# Patient Record
Sex: Male | Born: 1955 | Race: Black or African American | Hispanic: No | Marital: Single | Smoking: Never smoker
Health system: Southern US, Community
[De-identification: ages and names within clinical notes are randomized; demographics above are authoritative.]

## PROBLEM LIST (undated history)

## (undated) DIAGNOSIS — K7460 Unspecified cirrhosis of liver: Secondary | ICD-10-CM

## (undated) DIAGNOSIS — Z8719 Personal history of other diseases of the digestive system: Secondary | ICD-10-CM

## (undated) DIAGNOSIS — K759 Inflammatory liver disease, unspecified: Secondary | ICD-10-CM

## (undated) HISTORY — PX: COLONOSCOPY: SHX174

## (undated) HISTORY — PX: FRACTURE SURGERY: SHX138

---
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
## 1998-01-29 ENCOUNTER — Inpatient Hospital Stay (HOSPITAL_COMMUNITY): Admission: EM | Admit: 1998-01-29 | Discharge: 1998-01-30 | Payer: Self-pay | Admitting: Emergency Medicine

## 1998-01-29 ENCOUNTER — Encounter: Payer: Self-pay | Admitting: Orthopedic Surgery

## 1999-09-20 ENCOUNTER — Emergency Department (HOSPITAL_COMMUNITY): Admission: EM | Admit: 1999-09-20 | Discharge: 1999-09-20 | Payer: Self-pay | Admitting: Emergency Medicine

## 2000-12-15 ENCOUNTER — Emergency Department (HOSPITAL_COMMUNITY): Admission: EM | Admit: 2000-12-15 | Discharge: 2000-12-15 | Payer: Self-pay | Admitting: Emergency Medicine

## 2001-06-17 ENCOUNTER — Emergency Department (HOSPITAL_COMMUNITY): Admission: EM | Admit: 2001-06-17 | Discharge: 2001-06-17 | Payer: Self-pay

## 2003-02-04 ENCOUNTER — Emergency Department (HOSPITAL_COMMUNITY): Admission: EM | Admit: 2003-02-04 | Discharge: 2003-02-04 | Payer: Self-pay | Admitting: Emergency Medicine

## 2003-02-04 ENCOUNTER — Encounter: Payer: Self-pay | Admitting: Emergency Medicine

## 2009-09-20 ENCOUNTER — Emergency Department (HOSPITAL_COMMUNITY): Admission: EM | Admit: 2009-09-20 | Discharge: 2009-09-20 | Payer: Self-pay | Admitting: Emergency Medicine

## 2009-09-25 ENCOUNTER — Emergency Department (HOSPITAL_COMMUNITY): Admission: EM | Admit: 2009-09-25 | Discharge: 2009-09-25 | Payer: Self-pay | Admitting: Emergency Medicine

## 2012-05-26 ENCOUNTER — Ambulatory Visit: Payer: Self-pay | Admitting: Family Medicine

## 2012-06-23 ENCOUNTER — Ambulatory Visit: Payer: Self-pay | Admitting: Family Medicine

## 2012-07-07 ENCOUNTER — Ambulatory Visit: Payer: Self-pay | Admitting: Family Medicine

## 2012-12-24 ENCOUNTER — Ambulatory Visit: Payer: Self-pay

## 2013-05-24 ENCOUNTER — Encounter (HOSPITAL_COMMUNITY): Payer: Self-pay | Admitting: Emergency Medicine

## 2013-05-24 ENCOUNTER — Emergency Department (HOSPITAL_COMMUNITY)
Admission: EM | Admit: 2013-05-24 | Discharge: 2013-05-24 | Disposition: A | Discharge status: Home / Self Care | Payer: Self-pay | Attending: Emergency Medicine | Admitting: Emergency Medicine

## 2013-05-24 ENCOUNTER — Emergency Department (HOSPITAL_COMMUNITY): Payer: Self-pay

## 2013-05-24 DIAGNOSIS — R0789 Other chest pain: Secondary | ICD-10-CM

## 2013-05-24 DIAGNOSIS — R071 Chest pain on breathing: Secondary | ICD-10-CM | POA: Insufficient documentation

## 2013-05-24 DIAGNOSIS — R0602 Shortness of breath: Secondary | ICD-10-CM | POA: Insufficient documentation

## 2013-05-24 LAB — CBC
HCT: 48.7 % (ref 39.0–52.0)
Hemoglobin: 16.7 g/dL (ref 13.0–17.0)
MCH: 28.9 pg (ref 26.0–34.0)
MCHC: 34.3 g/dL (ref 30.0–36.0)
MCV: 84.3 fL (ref 78.0–100.0)
Platelets: 160 10*3/uL (ref 150–400)
RBC: 5.78 MIL/uL (ref 4.22–5.81)
RDW: 13.3 % (ref 11.5–15.5)
WBC: 10.1 10*3/uL (ref 4.0–10.5)

## 2013-05-24 LAB — COMPREHENSIVE METABOLIC PANEL
ALT: 52 U/L (ref 0–53)
BUN: 12 mg/dL (ref 6–23)
CO2: 25 mEq/L (ref 19–32)
Calcium: 9.5 mg/dL (ref 8.4–10.5)
Creatinine, Ser: 1.01 mg/dL (ref 0.50–1.35)
GFR calc Af Amer: >90 mL/min (ref >90)
GFR calc non Af Amer: 79 mL/min — ABNORMAL LOW (ref >90)
Glucose, Bld: 102 mg/dL — ABNORMAL HIGH (ref 70–99)
Total Protein: 9.3 g/dL — ABNORMAL HIGH (ref 6.0–8.3)

## 2013-05-24 LAB — POCT I-STAT TROPONIN I: Troponin i, poc: 0.12 ng/mL (ref 0.00–0.08)

## 2013-05-24 LAB — D-DIMER, QUANTITATIVE: D-Dimer, Quant: 3.05 ug/mL-FEU — ABNORMAL HIGH (ref 0.00–0.48)

## 2013-05-24 LAB — TROPONIN I: Troponin I: <0.3 ng/mL (ref <0.30)

## 2013-05-24 MED ORDER — ASPIRIN 81 MG PO CHEW
324.0000 mg | CHEWABLE_TABLET | Freq: Once | ORAL | Status: AC
  Administered 2013-05-24: 324 mg via ORAL
  Filled 2013-05-24: qty 4

## 2013-05-24 MED ORDER — KETOROLAC TROMETHAMINE 30 MG/ML IJ SOLN
30.0000 mg | Freq: Once | INTRAMUSCULAR | Status: AC
  Administered 2013-05-24: 30 mg via INTRAVENOUS
  Filled 2013-05-24: qty 1

## 2013-05-24 MED ORDER — HYDROCODONE-ACETAMINOPHEN 5-325 MG PO TABS: 1.0000 | ORAL_TABLET | ORAL | Status: DC | PRN

## 2013-05-24 MED ORDER — SODIUM CHLORIDE 0.9 % IV BOLUS (SEPSIS)
1000.0000 mL | Freq: Once | INTRAVENOUS | Status: AC
  Administered 2013-05-24: 1000 mL via INTRAVENOUS

## 2013-05-24 MED ORDER — IOHEXOL 350 MG/ML SOLN
100.0000 mL | Freq: Once | INTRAVENOUS | Status: AC | PRN
  Administered 2013-05-24: 75 mL via INTRAVENOUS

## 2013-05-24 MED ORDER — IBUPROFEN 800 MG PO TABS: 800.0000 mg | ORAL_TABLET | Freq: Three times a day (TID) | ORAL | Status: DC

## 2013-05-24 NOTE — ED Notes (Signed)
Rt upper chest pain and under ribs x 4 days has had some sob no injury no cough hurts to cough badly

## 2013-05-24 NOTE — ED Provider Notes (Signed)
TIME SEEN: 9:09 AM  CHIEF COMPLAINT: Right-sided chest pain  HPI: Patient is a 57 year old male with no significant past medical history who presents the emergency department with one week of right-sided chest pain. He still describes the pain as a stabbing pain that radiates into his upper abdomen. He feels short of breath because it hurts more to take a deep breath. He also reports it hurts to bend over or lying on his right side. His pain improves at home with ibuprofen. He denies any nausea, vomiting or diarrhea. No fever. No productive cough. No lower extremity swelling or pain. No h/o injury to his right chest wall.  Patient denies a history of cardiac disease, hypertension, diabetes, hyperlipidemia, tobacco use, family history of premature CAD. He denies a history of prior PE or DVT, recent prolonged immobilization such as hospitalization or long flight, recent surgery, trauma, fracture.  PCP - none  ROS: See HPI Constitutional: no fever  Eyes: no drainage  ENT: no runny nose   Cardiovascular:   chest pain  Resp: SOB  GI: no vomiting GU: no dysuria Integumentary: no rash  Allergy: no hives  Musculoskeletal: no leg swelling  Neurological: no slurred speech ROS otherwise negative  PAST MEDICAL HISTORY/PAST SURGICAL HISTORY:  History reviewed. No pertinent past medical history.  MEDICATIONS:  Prior to Admission medications   Not on File    ALLERGIES:  No Known Allergies  SOCIAL HISTORY:  History  Substance Use Topics  . Smoking status: Never Smoker   . Smokeless tobacco: Not on file  . Alcohol Use: Yes    FAMILY HISTORY: No family history on file.  EXAM: BP 142/98  Pulse 110  Temp(Src) 97.5 F (36.4 C)  Resp 20  SpO2 99% CONSTITUTIONAL: Alert and oriented and responds appropriately to questions. Well-appearing; well-nourished HEAD: Normocephalic EYES: Conjunctivae clear, PERRL ENT: normal nose; no rhinorrhea; moist mucous membranes; pharynx without lesions  noted NECK: Supple, no meningismus, no LAD  CARD: RRR; S1 and S2 appreciated; no murmurs, no clicks, no rubs, no gallops RESP: Normal chest excursion without splinting or tachypnea; breath sounds clear and equal bilaterally; no wheezes, no rhonchi, no rales, chest wall is tender to palpation over the right side without crepitus ABD/GI: Normal bowel sounds; non-distended; soft, non-tender, no rebound, no guarding BACK:  The back appears normal and is non-tender to palpation, there is no CVA tenderness EXT: Normal ROM in all joints; non-tender to palpation; no edema; normal capillary refill; no cyanosis    SKIN: Normal color for age and race; warm NEURO: Moves all extremities equally PSYCH: The patient's mood and manner are appropriate. Grooming and personal hygiene are appropriate.  MEDICAL DECISION MAKING: Patient here with atypical chest pain that is likely chest wall pain. He has no risk factors for PE and ACS other than age.  Given pain has been present for one week, we will obtain one set of cardiac enzymes. We'll also obtain d-dimer given his pleuritic component and tachycardia in the ED. We'll also obtain chest x-ray. Will give Toradol for pain relief.  ED PROGRESS: Patient's initial troponin on I stat is 0.12. Will repeat.  10:12 AM  Patient's d-dimer is elevated at 3.05. We'll obtain CT imaging of chest to rule out pulmonary embolus.   Patient's tachycardia and tachypnea have improved.  11:39 AM  Pt reports feeling much better after Toradol. His pain is almost completely gone. His CT chest shows no pulmonary embolus or other abnormality. Repeat troponin was negative. Suspect initial slight  elevation was secondary to hemolysis. Will repeat troponin at 6 hours after the onset of symptoms. He reports his symptoms started at 7 AM. Patient is comfortable with this plan.  2:55 PM  Pt is still feeling well. His second troponin is negative. We'll discharge home with prescription for ibuprofen and  Vicodin. Will give PCP followup information. Given strict return precautions. Patient and wife at bedside verbalize understanding and are comfortable with plan.     EKG Interpretation    Date/Time:  Monday May 24 2013 09:05:30 EST Ventricular Rate:  99 PR Interval:  158 QRS Duration: 86 QT Interval:  354 QTC Calculation: 454 R Axis:   -64 Text Interpretation:  Normal sinus rhythm Left axis deviation Abnormal ECG Confirmed by WARD  DO, KRISTEN ([id number]) on 05/24/2013 9:09:00 AM             Layla Maw Ward, DO 05/24/13 1456

## 2013-12-07 ENCOUNTER — Encounter (HOSPITAL_COMMUNITY): Payer: Self-pay | Admitting: Emergency Medicine

## 2013-12-07 ENCOUNTER — Emergency Department (HOSPITAL_COMMUNITY)
Admission: EM | Admit: 2013-12-07 | Discharge: 2013-12-07 | Disposition: A | Discharge status: Home / Self Care | Payer: Self-pay | Attending: Emergency Medicine | Admitting: Emergency Medicine

## 2013-12-07 DIAGNOSIS — B356 Tinea cruris: Secondary | ICD-10-CM | POA: Insufficient documentation

## 2013-12-07 DIAGNOSIS — Z79899 Other long term (current) drug therapy: Secondary | ICD-10-CM | POA: Insufficient documentation

## 2013-12-07 MED ORDER — GRISEOFULVIN ULTRAMICROSIZE 250 MG PO TABS: 250.0000 mg | ORAL_TABLET | Freq: Two times a day (BID) | ORAL | Status: DC

## 2013-12-07 MED ORDER — DIPHENHYDRAMINE HCL 25 MG PO TABS: 25.0000 mg | ORAL_TABLET | Freq: Four times a day (QID) | ORAL | Status: DC | PRN

## 2013-12-07 NOTE — ED Provider Notes (Signed)
Medical screening examination/treatment/procedure(s) were performed by non-physician practitioner and as supervising physician I was immediately available for consultation/collaboration.   EKG Interpretation None       Threasa Beards, MD 12/07/13 1225

## 2013-12-07 NOTE — Discharge Summary (Signed)
[facility]ia[facility] was not able to see patient, [facility] orange card information and primary care resource guide will be mailed to the address listed

## 2013-12-07 NOTE — ED Provider Notes (Signed)
CSN: [id number]     Arrival date & time 12/07/13  1016 History  This chart was scribed for non-physician practitioner, Clayton Bibles, PA-C, working with Threasa Beards, MD by Roe Coombs, ED Scribe. This patient was seen in room TR07C/TR07C and the patient's care was started at 12:12 PM.   Chief Complaint  Patient presents with  . Pruritis    The history is provided by the patient. No language interpreter was used.    HPI Comments: Luis Morrison is a 58 y.o. male who presents to the Emergency Department complaining of a burning, pruritic rash for more than 1 week. Patient thinks that rash is jock itch and he has been using Lamisil and topical camphor for the past 1 week without only mild improvement. He denies testicular swelling, testicular pain, penile discharge, penile pain, abdominal pain, fever, vomiting, or nausea. He does not take steroids regularly. He has no chronic medical conditions. Patient does not have a PCP.   History reviewed. No pertinent past medical history. History reviewed. No pertinent past surgical history. History reviewed. No pertinent family history. History  Substance Use Topics  . Smoking status: Never Smoker   . Smokeless tobacco: Not on file  . Alcohol Use: Yes    Review of Systems  Constitutional: Negative for fever and chills.  Gastrointestinal: Negative for nausea and vomiting.  Genitourinary: Negative for discharge, penile swelling, penile pain and testicular pain.  Skin: Positive for rash.  All other systems reviewed and are negative.    Allergies  Review of patient's allergies indicates no known allergies.  Home Medications   Prior to Admission medications   Medication Sig Start Date End Date Taking? Authorizing Provider  Emollient (GOLD BOND ULTIMATE HEALING) CREA Apply 1 application topically 3 (three) times daily as needed (for itching).   Yes Historical Provider, MD  menthol-zinc oxide (GOLD BOND) powder Apply 1 application topically 3  (three) times daily as needed (for itching).   Yes Historical Provider, MD   Triage Vitals: BP 132/82  Pulse 75  Temp(Src) 97.9 F (36.6 C) (Oral)  Resp 20  Ht 5\' 10"  (1.778 m)  Wt 200 lb (90.719 kg)  BMI 28.70 kg/m2  SpO2 95%  Physical Exam  Nursing note and vitals reviewed. Constitutional: He appears well-developed and well-nourished. No distress.  HENT:  Head: Normocephalic and atraumatic.  Neck: Neck supple.  Pulmonary/Chest: Effort normal.  Neurological: He is alert.  Skin: He is not diaphoretic.  Raised scaly whitish plaque to bilateral groin, centrifugal spread with central clearing. No break in the skin. No erythema, edema, warmth or discharge.    ED Course  Procedures (including critical care time) DIAGNOSTIC STUDIES: Oxygen Saturation is 95% on room air, adequate by my interpretation.    COORDINATION OF CARE: 12:15 PM- Patient informed of current plan for treatment and evaluation and agrees with plan at this time.    MDM   Final diagnoses:  Tinea cruris   Afebrile nontoxic immunocompetent patient with tinea cruris not improving with topical medication.  D/C home with griseofulvin per Up To Date. PCP follow up  Discussed  findings, treatment, and follow up  with patient.  Pt given return precautions.  Pt verbalizes understanding and agrees with plan.      I personally performed the services described in this documentation, which was scribed in my presence. The recorded information has been reviewed and is accurate.    Clayton Bibles, PA-C 12/07/13 1224

## 2013-12-07 NOTE — Discharge Instructions (Signed)
Read the information below.  Use the prescribed medication as directed.  Please discuss all new medications with your pharmacist.  You may return to the Emergency Department at any time for worsening condition or any new symptoms that concern you.  If you develop redness, swelling, pus draining from the wound, or fevers greater than 100.4, return to the ER immediately for a recheck.    Jock Itch Jock itch is a fungal infection of the skin in the groin area. It is sometimes called "ringworm" even though it is not caused by a worm. A fungus is a type of germ that thrives in dark, damp places.  CAUSES  This infection may spread from:  A fungus infection elsewhere on the body (such as athlete's foot).  Sharing towels or clothing. This infection is more common in:  Hot, humid climates.  People who wear tight-fitting clothing or wet bathing suits for long periods of time.  Athletes.  Overweight people.  People with diabetes. SYMPTOMS  Jock itch causes the following symptoms:  Red, pink or brown rash in the groin. Rash may spread to the thighs, anus, and buttocks.  Itching. DIAGNOSIS  Your caregiver may make the diagnosis by looking at the rash. Sometimes a skin scraping will be sent to test for fungus. Testing can be done either by looking under the microscope or by doing a culture (test to try to grow the fungus). A culture can take up to 2 weeks to come back. TREATMENT  Jock itch may be treated with:  Skin cream or ointment to kill fungus.  Medicine by mouth to kill fungus.  Skin cream or ointment to calm the itching.  Compresses or medicated powders to dry the infected skin. HOME CARE INSTRUCTIONS   Be sure to treat the rash completely. Follow your caregiver's instructions. It can take a couple of weeks to treat. If you do not treat the infection long enough, the rash can come back.  Wear loose-fitting clothing.  Men should wear cotton boxer shorts.  Women should wear  cotton underwear.  Avoid hot baths.  Dry the groin area well after bathing. SEEK MEDICAL CARE IF:   Your rash is worse.  Your rash is spreading.  Your rash returns after treatment is finished.  Your rash is not gone in 4 weeks. Fungal infections are slow to respond to treatment. Some redness may remain for several weeks after the fungus is gone. SEEK IMMEDIATE MEDICAL CARE IF:  The area becomes red, warm, tender, and swollen.  You have a fever. Document Released: 05/17/2002 Document Revised: 08/19/2011 Document Reviewed: 04/15/2008 [facility] Patient Information 2015 [place], [place]. This information is not intended to replace advice given to you by your health care provider. Make sure you discuss any questions you have with your health care provider.

## 2013-12-07 NOTE — ED Notes (Signed)
He states 'IM having the jock itch, i tried stuff from the drug store but its not getting better."

## 2014-08-30 ENCOUNTER — Emergency Department (HOSPITAL_COMMUNITY): Payer: Self-pay

## 2014-08-30 ENCOUNTER — Emergency Department (HOSPITAL_COMMUNITY)
Admission: EM | Admit: 2014-08-30 | Discharge: 2014-08-30 | Disposition: A | Discharge status: Home / Self Care | Payer: Self-pay | Attending: Emergency Medicine | Admitting: Emergency Medicine

## 2014-08-30 ENCOUNTER — Encounter (HOSPITAL_COMMUNITY): Payer: Self-pay

## 2014-08-30 DIAGNOSIS — R1031 Right lower quadrant pain: Secondary | ICD-10-CM | POA: Insufficient documentation

## 2014-08-30 DIAGNOSIS — R110 Nausea: Secondary | ICD-10-CM | POA: Insufficient documentation

## 2014-08-30 DIAGNOSIS — R109 Unspecified abdominal pain: Secondary | ICD-10-CM

## 2014-08-30 DIAGNOSIS — R1011 Right upper quadrant pain: Secondary | ICD-10-CM | POA: Insufficient documentation

## 2014-08-30 DIAGNOSIS — D696 Thrombocytopenia, unspecified: Secondary | ICD-10-CM | POA: Insufficient documentation

## 2014-08-30 LAB — COMPREHENSIVE METABOLIC PANEL
ALK PHOS: 77 U/L (ref 39–117)
ALT: 61 U/L — ABNORMAL HIGH (ref 0–53)
AST: 72 U/L — ABNORMAL HIGH (ref 0–37)
Albumin: 3.2 g/dL — ABNORMAL LOW (ref 3.5–5.2)
Anion gap: 8 (ref 5–15)
BUN: 9 mg/dL (ref 6–23)
CO2: 26 mmol/L (ref 19–32)
Calcium: 8.7 mg/dL (ref 8.4–10.5)
Chloride: 103 mmol/L (ref 96–112)
Creatinine, Ser: 1.03 mg/dL (ref 0.50–1.35)
GFR calc non Af Amer: 77 mL/min — ABNORMAL LOW (ref >90)
GFR, EST AFRICAN AMERICAN: 89 mL/min — AB (ref >90)
GLUCOSE: 126 mg/dL — AB (ref 70–99)
POTASSIUM: 4.2 mmol/L (ref 3.5–5.1)
Sodium: 137 mmol/L (ref 135–145)
Total Bilirubin: 1.2 mg/dL (ref 0.3–1.2)
Total Protein: 8.3 g/dL (ref 6.0–8.3)

## 2014-08-30 LAB — URINALYSIS, ROUTINE W REFLEX MICROSCOPIC
Glucose, UA: NEGATIVE mg/dL
Hgb urine dipstick: NEGATIVE
KETONES UR: 15 mg/dL — AB
LEUKOCYTES UA: NEGATIVE
NITRITE: NEGATIVE
PROTEIN: NEGATIVE mg/dL
Specific Gravity, Urine: 1.027 (ref 1.005–1.030)
Urobilinogen, UA: 2 mg/dL — ABNORMAL HIGH (ref 0.0–1.0)
pH: 6 (ref 5.0–8.0)

## 2014-08-30 LAB — CBC WITH DIFFERENTIAL/PLATELET
Basophils Absolute: 0 10*3/uL (ref 0.0–0.1)
Basophils Relative: 0 % (ref 0–1)
EOS ABS: 0 10*3/uL (ref 0.0–0.7)
EOS PCT: 0 % (ref 0–5)
HEMATOCRIT: 46.4 % (ref 39.0–52.0)
Hemoglobin: 16 g/dL (ref 13.0–17.0)
LYMPHS ABS: 1 10*3/uL (ref 0.7–4.0)
Lymphocytes Relative: 14 % (ref 12–46)
MCH: 29.5 pg (ref 26.0–34.0)
MCHC: 34.5 g/dL (ref 30.0–36.0)
MCV: 85.6 fL (ref 78.0–100.0)
Monocytes Absolute: 0.9 10*3/uL (ref 0.1–1.0)
Monocytes Relative: 12 % (ref 3–12)
NEUTROS ABS: 5.2 10*3/uL (ref 1.7–7.7)
Neutrophils Relative %: 73 % (ref 43–77)
PLATELETS: 83 10*3/uL — AB (ref 150–400)
RBC: 5.42 MIL/uL (ref 4.22–5.81)
RDW: 13.6 % (ref 11.5–15.5)
WBC: 7.1 10*3/uL (ref 4.0–10.5)

## 2014-08-30 LAB — LIPASE, BLOOD: Lipase: 22 U/L (ref 11–59)

## 2014-08-30 LAB — I-STAT TROPONIN, ED: TROPONIN I, POC: 0.01 ng/mL (ref 0.00–0.08)

## 2014-08-30 MED ORDER — HYDROMORPHONE HCL 1 MG/ML IJ SOLN
1.0000 mg | INTRAMUSCULAR | Status: DC | PRN
  Administered 2014-08-30 (×2): 1 mg via INTRAVENOUS
  Filled 2014-08-30 (×2): qty 1

## 2014-08-30 MED ORDER — ALBUTEROL SULFATE HFA 108 (90 BASE) MCG/ACT IN AERS
2.0000 | INHALATION_SPRAY | RESPIRATORY_TRACT | Status: DC
  Administered 2014-08-30 (×2): 2 via RESPIRATORY_TRACT
  Filled 2014-08-30: qty 6.7

## 2014-08-30 MED ORDER — IOHEXOL 300 MG/ML  SOLN
100.0000 mL | Freq: Once | INTRAMUSCULAR | Status: AC | PRN
  Administered 2014-08-30: 100 mL via INTRAVENOUS

## 2014-08-30 MED ORDER — SODIUM CHLORIDE 0.9 % IV SOLN
1000.0000 mL | Freq: Once | INTRAVENOUS | Status: AC
  Administered 2014-08-30: 1000 mL via INTRAVENOUS

## 2014-08-30 MED ORDER — HYDROMORPHONE HCL 1 MG/ML IJ SOLN: 1.0000 mg | Freq: Once | INTRAMUSCULAR | Status: DC

## 2014-08-30 MED ORDER — ONDANSETRON HCL 4 MG/2ML IJ SOLN
4.0000 mg | Freq: Once | INTRAMUSCULAR | Status: AC
  Administered 2014-08-30: 4 mg via INTRAVENOUS
  Filled 2014-08-30: qty 2

## 2014-08-30 MED ORDER — SODIUM CHLORIDE 0.9 % IV SOLN
1000.0000 mL | INTRAVENOUS | Status: DC
  Administered 2014-08-30: 1000 mL via INTRAVENOUS

## 2014-08-30 NOTE — Discharge Instructions (Signed)
Abdominal Pain Many things can cause abdominal pain. Usually, abdominal pain is not caused by a disease and will improve without treatment. It can often be observed and treated at home. Your health care provider will do a physical exam and possibly order blood tests and X-rays to help determine the seriousness of your pain. However, in many cases, more time must pass before a clear cause of the pain can be found. Before that point, your health care provider may not know if you need more testing or further treatment. HOME CARE INSTRUCTIONS  Monitor your abdominal pain for any changes. The following actions may help to alleviate any discomfort you are experiencing:  Only take over-the-counter or prescription medicines as directed by your health care provider.  Do not take laxatives unless directed to do so by your health care provider.  Try a clear liquid diet (broth, tea, or water) as directed by your health care provider. Slowly move to a bland diet as tolerated. SEEK MEDICAL CARE IF:  You have unexplained abdominal pain.  You have abdominal pain associated with nausea or diarrhea.  You have pain when you urinate or have a bowel movement.  You experience abdominal pain that wakes you in the night.  You have abdominal pain that is worsened or improved by eating food.  You have abdominal pain that is worsened with eating fatty foods.  You have a fever. SEEK IMMEDIATE MEDICAL CARE IF:   Your pain does not go away within 2 hours.  You keep throwing up (vomiting).  Your pain is felt only in portions of the abdomen, such as the right side or the left lower portion of the abdomen.  You pass bloody or black tarry stools. MAKE SURE YOU:  Understand these instructions.   Will watch your condition.   Will get help right away if you are not doing well or get worse.  Document Released: 03/06/2005 Document Revised: 06/01/2013 Document Reviewed:  02/03/2013 Thrombocytopenia Thrombocytopenia is a condition in which there is an abnormally small number of platelets in your blood. Platelets are also called thrombocytes. Platelets are needed for blood clotting. CAUSES Thrombocytopenia is caused by:   Decreased production of platelets. This can be caused by:  Aplastic anemia in which your bone marrow quits making blood cells.  Cancer in the bone marrow.  Use of certain medicines, including chemotherapy.  Infection in the bone marrow.  Heavy alcohol consumption.  Increased destruction of platelets. This can be caused by:  Certain immune diseases.  Use of certain drugs.  Certain blood clotting disorders.  Certain inherited disorders.  Certain bleeding disorders.  Pregnancy.  Having an enlarged spleen (hypersplenism). In hypersplenism, the spleen gathers up platelets from circulation. This means the platelets are not available to help with blood clotting. The spleen can enlarge due to cirrhosis or other conditions. SYMPTOMS  The symptoms of thrombocytopenia are side effects of poor blood clotting. Some of these are:  Abnormal bleeding.  Nosebleeds.  Heavy menstrual periods.  Blood in the urine or stools.  Purpura. This is a purplish discoloration in the skin produced by small bleeding vessels near the surface of the skin.  Bruising.  A rash that may be petechial. This looks like pinpoint, purplish-red spots on the skin and mucous membranes. It is caused by bleeding from small blood vessels (capillaries). DIAGNOSIS  Your caregiver will make this diagnosis based on your exam and blood tests. Sometimes, a bone marrow study is done to look for the original cells (megakaryocytes)  that make platelets. TREATMENT  Treatment depends on the cause of the condition.  Medicines may be given to help protect your platelets from being destroyed.  In some cases, a replacement (transfusion) of platelets may be required to stop or  prevent bleeding.  Sometimes, the spleen must be surgically removed. HOME CARE INSTRUCTIONS   Check the skin and linings inside your mouth for bruising or bleeding as directed by your caregiver.  Check your sputum, urine, and stool for blood as directed by your caregiver.  Do not return to any activities that could cause bumps or bruises until your caregiver says it is okay.  Take extra care not to cut yourself when shaving or when using scissors, needles, knives, and other tools.  Take extra care not to burn yourself when ironing or cooking.  Ask your caregiver if it is okay for you to drink alcohol.  Only take over-the-counter or prescription medicines as directed by your caregiver.  Notify all your caregivers, including dentists and eye doctors, about your condition. SEEK IMMEDIATE MEDICAL CARE IF:   You develop active bleeding from anywhere in your body.  You develop unexplained bruising or bleeding.  You have blood in your sputum, urine, or stool. MAKE SURE YOU:  Understand these instructions.  Will watch your condition.  Will get help right away if you are not doing well or get worse. Document Released: 05/27/2005 Document Revised: 08/19/2011 Document Reviewed: 03/29/2011 [facility] Patient Information 2015 [place], [place]. This information is not intended to replace advice given to you by your health care provider. Make sure you discuss any questions you have with your health care provider.  [employer] Patient Information 2015 [place]. This information is not intended to replace advice given to you by your health care provider. Make sure you discuss any questions you have with your health care provider.

## 2014-08-30 NOTE — ED Notes (Signed)
Not able to get blood. Pt had to go to bathroom

## 2014-08-30 NOTE — ED Notes (Signed)
Phlebotomy notified, unable to obtain blood form IV start.

## 2014-08-30 NOTE — ED Notes (Signed)
Patient in [place] at this time

## 2014-08-30 NOTE — ED Notes (Signed)
PT returned from [place]. Pt monitored by pulse ox, bp cuff, and 5-lead.

## 2014-08-30 NOTE — ED Notes (Signed)
Pt placed in gown and in bed. Pt monitored by pulse ox, bp cuff, and 12-lead. 

## 2014-08-30 NOTE — ED Provider Notes (Signed)
CSN: [id number]     Arrival date & time 08/30/14  1029 History   First MD Initiated Contact with Patient 08/30/14 1101     Chief Complaint  Patient presents with  . Abdominal Pain  . Shortness of Breath     HPI Patient presents emergency department complaining of right-sided abdominal pain with associated nausea.  He denies vomiting.  He denies diarrhea.  He states that the pain in his right abdomen is worse with movement and palpation.  He denies fever but does endorse chills over the past several days.  He reports cough and some associated shortness of breath with coughing.  He also reports the coughing makes the right abdomen hurt more.  No urinary symptoms.   History reviewed. No pertinent past medical history. History reviewed. No pertinent past surgical history. No family history on file. History  Substance Use Topics  . Smoking status: Never Smoker   . Smokeless tobacco: Not on file  . Alcohol Use: Yes    Review of Systems  All other systems reviewed and are negative.     Allergies  Review of patient's allergies indicates no known allergies.  Home Medications   Prior to Admission medications   Medication Sig Start Date End Date Taking? Authorizing Provider  Emollient (GOLD BOND ULTIMATE HEALING) CREA Apply 1 application topically 3 (three) times daily as needed (for itching).   Yes Historical Provider, MD  ibuprofen (ADVIL,MOTRIN) 200 MG tablet Take 400 mg by mouth every 6 (six) hours as needed for mild pain or moderate pain.   Yes Historical Provider, MD  Tetrahydroz-Glyc-Hyprom-PEG 0.05-0.2-0.36-1 % SOLN Apply 1-2 drops to eye daily as needed (red eyes).   Yes Historical Provider, MD  diphenhydrAMINE (BENADRYL) 25 MG tablet Take 1 tablet (25 mg total) by mouth every 6 (six) hours as needed for itching. Patient not taking: Reported on 08/30/2014 12/07/13   Clayton Bibles, PA-C  griseofulvin (GRIS-PEG) 250 MG tablet Take 1 tablet (250 mg total) by mouth 2 (two) times  daily. Patient not taking: Reported on 08/30/2014 12/07/13   Clayton Bibles, PA-C   BP 133/77 mmHg  Pulse 76  Temp(Src) 98.4 F (36.9 C) (Oral)  Resp 27  Ht 5\' 10"  (1.778 m)  Wt 210 lb (95.255 kg)  BMI 30.13 kg/m2  SpO2 93% Physical Exam  Constitutional: He is oriented to person, place, and time. He appears well-developed and well-nourished.  HENT:  Head: Normocephalic and atraumatic.  Eyes: EOM are normal.  Neck: Normal range of motion.  Cardiovascular: Normal rate, regular rhythm, normal heart sounds and intact distal pulses.   Pulmonary/Chest: Effort normal and breath sounds normal. No respiratory distress.  Abdominal: Soft. He exhibits no distension.  Right-sided abdominal tenderness right upper quadrant more than right lower quadrant.  No peritonitis  Musculoskeletal: Normal range of motion.  Neurological: He is alert and oriented to person, place, and time.  Skin: Skin is warm and dry.  Psychiatric: He has a normal mood and affect. Judgment normal.  Nursing note and vitals reviewed.   ED Course  Procedures (including critical care time) Labs Review Labs Reviewed  CBC WITH DIFFERENTIAL/PLATELET - Abnormal; Notable for the following:    Platelets 83 (*)    All other components within normal limits  COMPREHENSIVE METABOLIC PANEL - Abnormal; Notable for the following:    Glucose, Bld 126 (*)    Albumin 3.2 (*)    AST 72 (*)    ALT 61 (*)    GFR calc non Af  Amer 77 (*)    GFR calc Af Amer 89 (*)    All other components within normal limits  URINALYSIS, ROUTINE W REFLEX MICROSCOPIC - Abnormal; Notable for the following:    Color, Urine AMBER (*)    Bilirubin Urine SMALL (*)    Ketones, ur 15 (*)    Urobilinogen, UA 2.0 (*)    All other components within normal limits  LIPASE, BLOOD  URINALYSIS, ROUTINE W REFLEX MICROSCOPIC  I-STAT TROPOININ, ED    Imaging Review Dg Chest 2 View  08/30/2014   CLINICAL DATA:  59 year old male with worsening right lower chest and  abdomen pain for 1 week with shortness of Breath. Initial encounter.  EXAM: CHEST  2 VIEW  COMPARISON:  Chest CTA 05/24/2013 and earlier.  FINDINGS: Lower lung volumes. Stable cardiac size and mediastinal contours. Visualized tracheal air column is within normal limits. No pneumothorax. Chronic increased interstitial markings with further crowding at both lung bases. No pleural effusion or consolidation. No acute pulmonary edema suspected. No acute osseous abnormality identified.  IMPRESSION: Lower lung volumes with atelectasis. Chronic increased interstitial changes in both lungs.   Electronically Signed   By: Genevie Ann M.D.   On: 08/30/2014 12:58   US Abdomen Complete  08/30/2014   CLINICAL DATA:  Right upper quadrant pain.  EXAM: ULTRASOUND ABDOMEN COMPLETE  COMPARISON:  None.  FINDINGS: Gallbladder: No gallstones or wall thickening visualized. No sonographic Murphy sign noted.  Common bile duct: Diameter: 5.8 mm  Liver: Liver slightly echogenic, fatty infiltration and/or hepatocellular disease cannot be excluded.  IVC: No abnormality visualized.  Pancreas: Visualized portion unremarkable.  Spleen: Size and appearance within normal limits.  Right Kidney: Length: 12.5 cm. Echogenicity within normal limits. No mass or hydronephrosis visualized.  Left Kidney: Length: 13.8 cm. Echogenicity within normal limits. No mass or hydronephrosis visualized.  Abdominal aorta: No aneurysm visualized.  Other findings: None.  IMPRESSION: 1. Liver slightly echogenic, mild fatty infiltration and/or hepatocellular disease cannot be excluded. 2. Exam otherwise unremarkable.   Electronically Signed   By: Marcello Moores  Register   On: 08/30/2014 15:22  I personally reviewed the imaging tests through PACS system I reviewed available ER/hospitalization records through the EMR    EKG Interpretation None      MDM   Final diagnoses:  None    Asymmetric suspicion was for cholecystitis is ultrasounds without abnormalities.  He will  move on to CT abdomen pelvis given the degree of tenderness he has throughout his right abdomen.  Care to Dr. Ralene Bathe to follow-up on [place], MD 08/30/14 [phone]

## 2014-08-30 NOTE — ED Notes (Signed)
Pt states he has been having right sided pain for about a week now and having SOB. Felt faint today when coming into the ED. Also reports nasuea no vomiting.

## 2014-08-30 NOTE — ED Notes (Signed)
Pt finished drinking oral contrast. CT notified.  

## 2014-08-30 NOTE — ED Notes (Signed)
Pt given oral contrast to drink.

## 2014-08-30 NOTE — ED Notes (Signed)
Pt returned from CT and placed back on monitor.

## 2014-08-30 NOTE — ED Provider Notes (Signed)
Patient physician Dr. Venora Maples. On evaluation patient reports his pain is resolved and he feels much improved, abdomen is soft and nontender. CT scan demonstrates questionable early appendicitis. History, presentation, physical exam is not consistent with appendicitis. Question underlying biliary disease. Patient has thrombocytopenia which is new for him. Discussed with patient importance of follow-up for further workup if his abdominal pain, thrombocytopenia, questionable liver disease. Discussed with patient the importance of abstaining from alcohol and Tylenol products as well as PCP follow-up in close return precautions.  Quintella Reichert, MD 08/30/14 [phone]

## 2014-09-09 ENCOUNTER — Ambulatory Visit: Payer: Self-pay | Attending: Internal Medicine | Admitting: Internal Medicine

## 2014-09-09 ENCOUNTER — Encounter (HOSPITAL_COMMUNITY): Payer: Self-pay

## 2014-09-09 ENCOUNTER — Encounter: Payer: Self-pay | Admitting: Internal Medicine

## 2014-09-09 ENCOUNTER — Ambulatory Visit (HOSPITAL_COMMUNITY)
Admission: RE | Admit: 2014-09-09 | Discharge: 2014-09-09 | Disposition: A | Discharge status: Home / Self Care | Payer: Self-pay | Source: Ambulatory Visit | Attending: Internal Medicine | Admitting: Internal Medicine

## 2014-09-09 VITALS — BP 135/85 | HR 81 | Temp 98.0°F | Resp 16 | Ht 70.0 in | Wt 214.0 lb

## 2014-09-09 DIAGNOSIS — K7460 Unspecified cirrhosis of liver: Secondary | ICD-10-CM | POA: Insufficient documentation

## 2014-09-09 DIAGNOSIS — D696 Thrombocytopenia, unspecified: Secondary | ICD-10-CM | POA: Insufficient documentation

## 2014-09-09 DIAGNOSIS — R1084 Generalized abdominal pain: Secondary | ICD-10-CM | POA: Insufficient documentation

## 2014-09-09 LAB — POCT URINALYSIS DIPSTICK
BILIRUBIN UA: NEGATIVE
Blood, UA: NEGATIVE
Glucose, UA: NEGATIVE
KETONES UA: NEGATIVE
Leukocytes, UA: NEGATIVE
Nitrite, UA: NEGATIVE
Protein, UA: NEGATIVE
Spec Grav, UA: 1.01
Urobilinogen, UA: 2
pH, UA: 6.5

## 2014-09-09 MED ORDER — KETOROLAC TROMETHAMINE 60 MG/2ML IM SOLN
60.0000 mg | Freq: Once | INTRAMUSCULAR | Status: AC
  Administered 2014-09-09: 60 mg via INTRAMUSCULAR

## 2014-09-09 MED ORDER — TRAMADOL HCL 50 MG PO TABS
50.0000 mg | ORAL_TABLET | Freq: Four times a day (QID) | ORAL | Status: DC | PRN
Start: 2014-09-09 — End: 2014-11-16

## 2014-09-09 MED ORDER — IOHEXOL 300 MG/ML  SOLN
80.0000 mL | Freq: Once | INTRAMUSCULAR | Status: AC | PRN
  Administered 2014-09-09: 80 mL via INTRAVENOUS

## 2014-09-09 NOTE — Progress Notes (Signed)
Pt is here to establish care. Pt states that his abdomen on both sides of his ribs. Pt cannot sleep due to the pain.

## 2014-09-09 NOTE — Progress Notes (Signed)
Patient ID: Luis Morrison, male   DOB: 1955/06/16, 59 y.o.   MRN: [id number]  EHM:[id number]  [id number]  DOB - 08/15/1955  CC:  Chief Complaint  Patient presents with  . Establish Care       HPI: Luis Morrison is a 59 y.o. male here today to establish medical care.  Patient presents to clinic today with concerns of abdominal pain that has been present for one month.  He reports that he has severe pain on bilateral sides near his ribs. He reports that it affects his breathing and he is unable to lay on his sides. The pain is aggravated by coughing and sneezing which has been present around the same time. He has never been a smoker. He has been having subjective fevers and chills. He has been coughing up green-yellow mucous. Denies rhinitis. Reports sore throat, chest pressure, headaches---pressure. He has tried OTC pain medication without much relief.   Past imaging studies are as follows.He denies any history of blood transfusions, IV drug use, or incarceration.   US Abdomen Complete   IMPRESSION: 1. Liver slightly echogenic, mild fatty infiltration and/or hepatocellular disease cannot be excluded. 2. Exam otherwise unremarkable.   Electronically Signed   By: Marcello Moores  Register   On: 08/30/2014 15:22   Ct Abdomen Pelvis W Contrast   IMPRESSION: 1. The appendix is upper limits of normal in caliber measuring 7 mm. There is slight indistinctness of the margins of the appendix. However, there is contrast identified within the lumen of the appendix. Findings are equivocal for for early appendicitis. Careful clinical correlation advise. 2. Morphologic features of the liver concerning for early cirrhosis. 3. Prominent upper abdominal lymph nodes. In the setting of hepatic cellular disease this is a nonspecific abnormality.   Electronically Signed   By: Kerby Moors M.D.   On: 08/30/2014 18:21     Patient has No headache, No chest pain, No abdominal pain - No Nausea, No new weakness tingling or  numbness, No Cough - SOB.  No Known Allergies History reviewed. No pertinent past medical history. No current outpatient prescriptions on file prior to visit.   No current facility-administered medications on file prior to visit.   Family History  Problem Relation Age of Onset  . Cancer Mother    History   Social History  . Marital Status: Divorced    Spouse Name: N/A  . Number of Children: N/A  . Years of Education: N/A   Occupational History  . Not on file.   Social History Main Topics  . Smoking status: Never Smoker   . Smokeless tobacco: Not on file  . Alcohol Use: Yes  . Drug Use: No  . Sexual Activity: Not on file   Other Topics Concern  . Not on file   Social History Narrative    Review of Systems  Constitutional: Positive for fever and chills. Negative for weight loss.  Respiratory: Positive for cough and sputum production. Negative for hemoptysis, shortness of breath and wheezing.   Gastrointestinal: Positive for abdominal pain. Negative for heartburn, nausea, vomiting, diarrhea and constipation.  Genitourinary: Negative for dysuria.  Musculoskeletal: Positive for back pain.  Skin: Negative for rash.  Neurological: Positive for headaches.  Psychiatric/Behavioral: Negative for substance abuse.  All other systems reviewed and are negative.     Objective:   Filed Vitals:   09/09/14 1148  BP: 135/85  Pulse: 81  Temp: 98 F (36.7 C)  Resp: 16    Physical Exam  Constitutional:  He is oriented to person, place, and time. He appears distressed.  Eyes: Conjunctivae and EOM are normal. Pupils are equal, round, and reactive to light. No scleral icterus.  Neck: Normal range of motion. No JVD present.  Cardiovascular: Normal rate, regular rhythm and normal heart sounds.   Pulmonary/Chest: Effort normal and breath sounds normal. He has no wheezes.  Abdominal: Soft. Bowel sounds are normal. He exhibits distension. He exhibits no mass. There is tenderness  (marked tenderness in L/RUQ. Tears when laying flat). There is no rebound and no guarding.  Patient is in noticeable amount of pain  Lymphadenopathy:    He has no cervical adenopathy.  Neurological: He is alert and oriented to person, place, and time.  Skin: Skin is warm and dry.     Lab Results  Component Value Date   WBC 7.1 08/30/2014   HGB 16.0 08/30/2014   HCT 46.4 08/30/2014   MCV 85.6 08/30/2014   PLT 83* 08/30/2014   Lab Results  Component Value Date   CREATININE 1.03 08/30/2014   BUN 9 08/30/2014   NA 137 08/30/2014   K 4.2 08/30/2014   CL 103 08/30/2014   CO2 26 08/30/2014    No results found for: HGBA1C Lipid Panel  No results found for: CHOL, TRIG, HDL, CHOLHDL, VLDL, LDLCALC     Assessment and plan:   Diagnoses and all orders for this visit:  Generalized abdominal pain Orders: -     CA 125---elevated but likely due to cirrhosis  -     Sedimentation rate -     C-reactive protein -     CEA -     ketorolac (TORADOL) injection 60 mg; Inject 2 mLs (60 mg total) into the muscle once. -     CT Abdomen Pelvis W Contrast; Future. Will repeat to make sure he does not have a ruptured appendix. -     traMADol (ULTRAM) 50 MG tablet; Take 1 tablet (50 mg total) by mouth every 6 (six) hours as needed. -     POCT urinalysis dipstick I will contact patient with CT results soon  Cirrhosis of liver without ascites, unspecified hepatic cirrhosis type Orders: -     ANA -     Hepatitis panel, acute -     Hepatitis C RNA quantitative Will look for causes of cirrhosis. He reports that he was a heavy drinker over 30 years ago. Will likely need referral to GI.  Thrombocytopenia Orders: -     CBC with Differential Will look to see if counts have improved. If not I will assess for any cancerous process  Follow up pending results  The patient was given clear instructions to go to ER or return to medical center if symptoms don't improve, worsen or new problems develop. The  patient verbalized understanding. The patient was told to call to get lab results if they haven't heard anything in the next week.     Chari Manning, NP-C [facility] [phone] 09/09/2014, 12:13 PM

## 2014-09-10 LAB — CBC WITH DIFFERENTIAL/PLATELET
BASOS ABS: 0 10*3/uL (ref 0.0–0.1)
BASOS PCT: 0 % (ref 0–1)
EOS PCT: 2 % (ref 0–5)
Eosinophils Absolute: 0.1 10*3/uL (ref 0.0–0.7)
HEMATOCRIT: 50.2 % (ref 39.0–52.0)
Hemoglobin: 16.6 g/dL (ref 13.0–17.0)
Lymphocytes Relative: 31 % (ref 12–46)
Lymphs Abs: 2 10*3/uL (ref 0.7–4.0)
MCH: 28.9 pg (ref 26.0–34.0)
MCHC: 33.1 g/dL (ref 30.0–36.0)
MCV: 87.5 fL (ref 78.0–100.0)
Monocytes Absolute: 0.5 10*3/uL (ref 0.1–1.0)
Monocytes Relative: 7 % (ref 3–12)
Neutro Abs: 3.9 10*3/uL (ref 1.7–7.7)
Neutrophils Relative %: 60 % (ref 43–77)
Platelets: 147 10*3/uL — ABNORMAL LOW (ref 150–400)
RBC: 5.74 MIL/uL (ref 4.22–5.81)
RDW: 14.1 % (ref 11.5–15.5)
WBC: 6.5 10*3/uL (ref 4.0–10.5)

## 2014-09-10 LAB — SEDIMENTATION RATE: Sed Rate: 12 mm/hr (ref 0–20)

## 2014-09-10 LAB — CEA: CEA: 0.6 ng/mL (ref 0.0–5.0)

## 2014-09-10 LAB — C-REACTIVE PROTEIN: CRP: 0.6 mg/dL — AB (ref <0.60)

## 2014-09-10 LAB — CA 125: CA 125: 66 U/mL — ABNORMAL HIGH (ref <35)

## 2014-09-12 LAB — ANA

## 2014-09-12 LAB — HEPATITIS C RNA QUANTITATIVE

## 2014-09-12 LAB — HEPATITIS PANEL, ACUTE
HCV Ab: REACTIVE — AB
HEP A IGM: NONREACTIVE
HEP B S AG: NEGATIVE

## 2014-09-13 ENCOUNTER — Other Ambulatory Visit: Payer: Self-pay | Admitting: Internal Medicine

## 2014-09-13 ENCOUNTER — Other Ambulatory Visit: Payer: Self-pay | Admitting: *Deleted

## 2014-09-13 DIAGNOSIS — K7460 Unspecified cirrhosis of liver: Secondary | ICD-10-CM

## 2014-09-13 NOTE — Progress Notes (Signed)
There was an issue processing his labs. I called the pt asking if he could come in the office to repeat the labs we were unable to get. I asked him to come in whenever is convenient.

## 2014-09-16 ENCOUNTER — Telehealth: Payer: Self-pay | Admitting: Internal Medicine

## 2014-09-16 ENCOUNTER — Telehealth: Payer: Self-pay | Admitting: *Deleted

## 2014-09-16 NOTE — Telephone Encounter (Signed)
Mr. Luis Morrison called in regarding his recent chest xray results.  Please advise and I will call patient back.

## 2014-09-16 NOTE — Telephone Encounter (Signed)
Pt called requesting CT scan results, pt is worried and is also requesting medication for pain. Please f/u with pt to review results.

## 2014-09-19 ENCOUNTER — Telehealth: Payer: Self-pay | Admitting: *Deleted

## 2014-09-19 NOTE — Telephone Encounter (Signed)
Pt is aware of his results. Pt was scheduled for another lab visit.

## 2014-09-20 ENCOUNTER — Encounter: Payer: Self-pay | Admitting: Internal Medicine

## 2014-09-21 ENCOUNTER — Ambulatory Visit: Payer: Self-pay

## 2014-09-21 ENCOUNTER — Ambulatory Visit: Payer: Self-pay | Attending: Internal Medicine

## 2014-09-21 DIAGNOSIS — K7460 Unspecified cirrhosis of liver: Secondary | ICD-10-CM

## 2014-09-22 LAB — ANA: ANA: NEGATIVE

## 2014-09-23 ENCOUNTER — Telehealth: Payer: Self-pay | Admitting: *Deleted

## 2014-09-23 DIAGNOSIS — K7469 Other cirrhosis of liver: Secondary | ICD-10-CM

## 2014-09-23 NOTE — Telephone Encounter (Signed)
Pt is aware of his results. Placed referral.

## 2014-09-23 NOTE — Telephone Encounter (Signed)
-----   Message from Luis Bosch, NP sent at 09/15/2014  5:42 PM EDT ----- Explain to patient that on this scan his appendix now appears to be normal. Ct did reveal that he has chronic liver disease--cirrhosis. He will need a referral to GI for further management. Please place referral. I am still waiting on him to give more blood to see if he has Hepatitis or not

## 2014-10-03 ENCOUNTER — Ambulatory Visit: Payer: Self-pay | Admitting: Internal Medicine

## 2014-10-05 ENCOUNTER — Telehealth: Payer: Self-pay | Admitting: Internal Medicine

## 2014-10-05 NOTE — Telephone Encounter (Signed)
Patient called requesting referral status for Hep C clinic , pt was referred to GI but they do not see Hep C patients. Please f/u with patient

## 2014-10-05 NOTE — Telephone Encounter (Signed)
Sent a new referral to [facility] for Hep C

## 2014-10-06 ENCOUNTER — Ambulatory Visit: Payer: Self-pay | Attending: Internal Medicine | Admitting: Internal Medicine

## 2014-10-06 ENCOUNTER — Encounter: Payer: Self-pay | Admitting: Internal Medicine

## 2014-10-06 ENCOUNTER — Telehealth: Payer: Self-pay | Admitting: Internal Medicine

## 2014-10-06 VITALS — BP 138/82 | HR 84 | Temp 98.1°F | Resp 16 | Ht 70.0 in | Wt 214.0 lb

## 2014-10-06 DIAGNOSIS — K088 Other specified disorders of teeth and supporting structures: Secondary | ICD-10-CM | POA: Insufficient documentation

## 2014-10-06 DIAGNOSIS — D229 Melanocytic nevi, unspecified: Secondary | ICD-10-CM

## 2014-10-06 DIAGNOSIS — Z0000 Encounter for general adult medical examination without abnormal findings: Secondary | ICD-10-CM

## 2014-10-06 DIAGNOSIS — K7460 Unspecified cirrhosis of liver: Secondary | ICD-10-CM | POA: Insufficient documentation

## 2014-10-06 DIAGNOSIS — K0889 Other specified disorders of teeth and supporting structures: Secondary | ICD-10-CM

## 2014-10-06 DIAGNOSIS — D2239 Melanocytic nevi of other parts of face: Secondary | ICD-10-CM | POA: Insufficient documentation

## 2014-10-06 NOTE — Telephone Encounter (Signed)
Sent Referral next month to [facility] .They will contact the patient to schedule an appointment with the specialist through the gccn card. it will take several months to get an appointment due to slots with the orange card.

## 2014-10-06 NOTE — Progress Notes (Signed)
Patient ID: Luis Morrison, male   DOB: 1955/06/30, 59 y.o.   MRN: [id number]  CC: referrals   HPI: Luis Morrison is a 59 y.o. male here today for a follow up visit.  Patient has past medical history of cirrhosis.  Patient reports that he called GI and told them that he need Hep C treatment and was told that he had the wrong referral. He has not had results of Hep C viral load to confirm whether or not he needs treatment.  He is requesting a referral to dentist, optometrist, and dermatology. He has a mole over his left eyebrow that he has cut off with a razor in the past but it grew back. He would like to have this removed permanently.   Patient has No headache, No chest pain, No Nausea, No new weakness tingling or numbness, No Cough - SOB.  No Known Allergies History reviewed. No pertinent past medical history. Current Outpatient Prescriptions on File Prior to Visit  Medication Sig Dispense Refill  . albuterol (PROVENTIL) (2.5 MG/3ML) 0.083% nebulizer solution Take 2.5 mg by nebulization every 6 (six) hours as needed for wheezing or shortness of breath.    . traMADol (ULTRAM) 50 MG tablet Take 1 tablet (50 mg total) by mouth every 6 (six) hours as needed. 60 tablet 0   No current facility-administered medications on file prior to visit.   Family History  Problem Relation Age of Onset  . Cancer Mother    History   Social History  . Marital Status: Divorced    Spouse Name: N/A  . Number of Children: N/A  . Years of Education: N/A   Occupational History  . Not on file.   Social History Main Topics  . Smoking status: Never Smoker   . Smokeless tobacco: Not on file  . Alcohol Use: Yes  . Drug Use: No  . Sexual Activity: Not on file   Other Topics Concern  . Not on file   Social History Narrative    Review of Systems  HENT:       Dental pain  Eyes: Positive for blurred vision.  Gastrointestinal: Positive for abdominal pain. Negative for heartburn, nausea and vomiting.  All  other systems reviewed and are negative.      Objective:   Filed Vitals:   10/06/14 0926  BP: 138/82  Pulse: 84  Temp: 98.1 F (36.7 C)  Resp: 16    Physical Exam  Cardiovascular: Normal rate, regular rhythm and normal heart sounds.   Pulmonary/Chest: Effort normal and breath sounds normal.  Abdominal: Bowel sounds are normal. He exhibits distension. He exhibits no mass. There is tenderness. There is no rebound and no guarding.  Neurological: He is alert.  Skin: Skin is warm and dry.     Lab Results  Component Value Date   WBC 6.5 09/09/2014   HGB 16.6 09/09/2014   HCT 50.2 09/09/2014   MCV 87.5 09/09/2014   PLT 147* 09/09/2014   Lab Results  Component Value Date   CREATININE 1.03 08/30/2014   BUN 9 08/30/2014   NA 137 08/30/2014   K 4.2 08/30/2014   CL 103 08/30/2014   CO2 26 08/30/2014    No results found for: HGBA1C Lipid Panel  No results found for: CHOL, TRIG, HDL, CHOLHDL, VLDL, LDLCALC     Assessment and plan:   Luis Morrison was seen today for follow-up.  Diagnoses and all orders for this visit:  Cirrhosis of liver without ascites, unspecified hepatic cirrhosis  type Orders: -     Hepatitis panel, acute -     Hepatitis C RNA quantitative Will redraw labs today for confirmation. If high viral load I will send him to Infectious disease. I have explained the progression of Hep C to cirrhosis and possibly liver cancer if not treated over time. I have warned him to avoid alcohol and medication that could harm his liver more  Pain, dental Orders: -     Ambulatory referral to Dentistry  Benign mole Orders: -     Ambulatory referral to Dermatology  Preventative health care Orders: -     Ambulatory referral to Ophthalmology  Return for pending results.      Chari Manning, NP-C [facility] [phone] 10/06/2014, 9:53 AM

## 2014-10-06 NOTE — Patient Instructions (Signed)
Cirrhosis  Cirrhosis is a condition of scarring of the liver which is caused when the liver has tried repairing itself following damage. This damage may come from a previous infection such as one of the forms of hepatitis (usually hepatitis C), or the damage may come from being injured by toxins. The main toxin that causes this damage is alcohol. The scarring of the liver from use of alcohol is irreversible. That means the liver cannot return to normal even though alcohol is not used any more. The main danger of hepatitis C infection is that it may cause long-lasting (chronic) liver disease, and this also may lead to cirrhosis. This complication is progressive and irreversible.  CAUSES   Prior to available blood tests, hepatitis C could be contracted by blood transfusions. Since testing of blood has improved, this is now unlikely. This infection can also be contracted through intravenous drug use and the sharing of needles. It can also be contracted through sexual relationships. The injury caused by alcohol comes from too much use. It is not a few drinks that poison the liver, but years of misuse. Usually there will be some signs and symptoms early with scarring of the liver that suggest the development of better habits. Alcohol should never be used while using acetaminophen. A small dose of both taken together may cause irreversible damage to the liver.  HOME CARE INSTRUCTIONS   There is no specific treatment for cirrhosis. However, there are things you can do to avoid making the condition worse.  · Rest as needed.  · Eat a well-balanced diet. Your caregiver can help you with suggestions.  · Vitamin supplements including vitamins A, K, D, and thiamine can help.  · A low-salt diet, water restriction, or diuretic medicine may be needed to reduce fluid retention.  · Avoid alcohol. This can be extremely toxic if combined with acetaminophen.  · Avoid drugs which are toxic to the liver. Some of these include isoniazid,  methyldopa, acetaminophen, anabolic steroids (muscle-building drugs), erythromycin, and oral contraceptives (birth control pills). Check with your caregiver to make sure medicines you are presently taking will not be harmful.  · Periodic blood tests may be required. Follow your caregiver's advice regarding the timing of these.  · Milk thistle is an herbal remedy which does protect the liver against toxins. However, it will not help once the liver has been scarred.  SEEK MEDICAL CARE IF:  · You have increasing fatigue or weakness.  · You develop swelling of the hands, feet, legs, or face.  · You vomit bright red blood, or a coffee ground appearing material.  · You have blood in your stools, or the stools turn black and tarry.  · You have a fever.  · You develop loss of appetite, or have nausea and vomiting.  · You develop jaundice.  · You develop easy bruising or bleeding.  · You have worsening of any of the problems you are concerned about.  Document Released: 05/27/2005 Document Revised: 08/19/2011 Document Reviewed: 01/13/2008  ExitCare® Patient Information ©2015 [employer]. This information is not intended to replace advice given to you by your health care provider. Make sure you discuss any questions you have with your health care provider.

## 2014-10-06 NOTE — Progress Notes (Signed)
Pt is here following up on his cirrhosis of his liver. Pt is requesting a referral for a specialist.

## 2014-10-06 NOTE — Telephone Encounter (Signed)
[facility] called stating that they do accept the orange card but would need to go through [place] [facility] first. Patient would like to be referred there

## 2014-10-07 LAB — HEPATITIS PANEL, ACUTE
HCV AB: REACTIVE — AB
Hep A IgM: NONREACTIVE
Hep B C IgM: NONREACTIVE
Hepatitis B Surface Ag: NEGATIVE

## 2014-10-07 LAB — HEPATITIS C RNA QUANTITATIVE
HCV QUANT: 330240 [IU]/mL — AB (ref <15)
HCV Quantitative Log: 5.52 {Log} — ABNORMAL HIGH (ref <1.18)

## 2014-10-09 DIAGNOSIS — Z8619 Personal history of other infectious and parasitic diseases: Secondary | ICD-10-CM | POA: Insufficient documentation

## 2014-10-09 DIAGNOSIS — K7460 Unspecified cirrhosis of liver: Secondary | ICD-10-CM | POA: Insufficient documentation

## 2014-10-09 HISTORY — DX: Unspecified cirrhosis of liver: K74.60

## 2014-10-11 ENCOUNTER — Encounter: Payer: Self-pay | Admitting: *Deleted

## 2014-10-11 ENCOUNTER — Telehealth: Payer: Self-pay | Admitting: *Deleted

## 2014-10-11 LAB — HEPATITIS C RNA QUANTITATIVE

## 2014-10-11 NOTE — Telephone Encounter (Signed)
Pt is aware of his results and will be waiting on a call from ID.

## 2014-10-11 NOTE — Telephone Encounter (Signed)
-----   Message from Lance Bosch, NP sent at 10/10/2014 10:31 AM EDT ----- Patient has viral load for Hep C. He will need Infectious disease and not GI. Explain to him that now we know that he has Hep C that is likely the cause of his cirrhosis. ID will address all of this with patient

## 2014-10-18 ENCOUNTER — Telehealth: Payer: Self-pay | Admitting: Internal Medicine

## 2014-10-21 ENCOUNTER — Other Ambulatory Visit: Payer: Self-pay

## 2014-10-21 ENCOUNTER — Telehealth: Payer: Self-pay | Admitting: General Practice

## 2014-10-21 DIAGNOSIS — B182 Chronic viral hepatitis C: Secondary | ICD-10-CM

## 2014-10-21 LAB — IRON: IRON: 147 ug/dL (ref 42–165)

## 2014-10-21 LAB — HEPATITIS B CORE ANTIBODY, TOTAL: Hep B Core Total Ab: NONREACTIVE

## 2014-10-21 LAB — HIV ANTIBODY (ROUTINE TESTING W REFLEX): HIV: NONREACTIVE

## 2014-10-21 LAB — PROTIME-INR
INR: 1.24 (ref <1.50)
Prothrombin Time: 15.6 seconds — ABNORMAL HIGH (ref 11.6–15.2)

## 2014-10-21 NOTE — Telephone Encounter (Signed)
Patient presents to clinic requesting medication change. Patient is currently taking Tramadol but states he needs something stronger.  Patient was just seen in the clinic on 10/06/14. Please follow up with patient.

## 2014-10-22 NOTE — Telephone Encounter (Signed)
Please explain to patient that we do not give out strong narcotics from this office.

## 2014-10-24 NOTE — Telephone Encounter (Signed)
Patient is calling to check on the status of getting a stronger medication.

## 2014-10-26 LAB — HEPATITIS C GENOTYPE

## 2014-11-15 NOTE — Telephone Encounter (Signed)
Explained to patient that this clinic would not prescribe stronger narcotics.  Patient states he has an appointment tomorrow and would discuss issues then.

## 2014-11-16 ENCOUNTER — Ambulatory Visit (INDEPENDENT_AMBULATORY_CARE_PROVIDER_SITE_OTHER): Payer: No Typology Code available for payment source | Admitting: Internal Medicine

## 2014-11-16 ENCOUNTER — Encounter: Payer: Self-pay | Admitting: Internal Medicine

## 2014-11-16 VITALS — BP 128/80 | HR 91 | Temp 98.4°F | Ht 70.0 in | Wt 213.0 lb

## 2014-11-16 DIAGNOSIS — B182 Chronic viral hepatitis C: Secondary | ICD-10-CM

## 2014-11-16 DIAGNOSIS — K7460 Unspecified cirrhosis of liver: Secondary | ICD-10-CM

## 2014-11-16 NOTE — Progress Notes (Signed)
+Luis Morrison is a 59 y.o. male who presents for initial evaluation and management of a positive Hepatitis C antibody test.  Patient tested positive this year. Hepatitis C risk factors present are: none. Patient denies intranasal drug use, IV drug abuse, renal dialysis, sexual contact with person with liver disease, tattoos. Patient has had other studies performed. Results: hepatitis C RNA by PCR, result: positive. Patient has not had prior treatment for Hepatitis C. Patient does not have a past history of liver disease. Patient does not have a family history of liver disease.   HPI: He is here for hepatitis C.  Tells me he does not have any pain now and does not want treatment.    Patient does not have documented immunity to Hepatitis A. Patient does not have documented immunity to Hepatitis B.     Review of Systems A comprehensive review of systems was negative.   No past medical history on file.  Prior to Admission medications   Medication Sig Start Date End Date Taking? Authorizing Provider  albuterol (PROVENTIL) (2.5 MG/3ML) 0.083% nebulizer solution Take 2.5 mg by nebulization every 6 (six) hours as needed for wheezing or shortness of breath.    Historical Provider, MD    No Known Allergies  History  Substance Use Topics  . Smoking status: Never Smoker   . Smokeless tobacco: Never Used  . Alcohol Use: No    Family History  Problem Relation Age of Onset  . Cancer Mother       Objective:   Filed Vitals:   11/16/14 1554  BP: 128/80  Pulse: 91  Temp: 98.4 F (36.9 C)   in no apparent distress and alert HEENT: anicteric Cor RRR clear Bowel sounds are normal, liver is not enlarged, spleen is not enlarged peripheral pulses normal, no pedal edema, no clubbing or cyanosis negative for - jaundice, spider hemangioma, telangiectasia, palmar erythema, ecchymosis and atrophy  Laboratory Genotype:  Lab Results  Component Value Date   HCVGENOTYPE 1b 10/21/2014   HCV  viral load:  Lab Results  Component Value Date   HCVQUANT 330240* 10/06/2014   HCVQUANT CANCELED 10/06/2014   Lab Results  Component Value Date   WBC 6.5 09/09/2014   HGB 16.6 09/09/2014   HCT 50.2 09/09/2014   MCV 87.5 09/09/2014   PLT 147* 09/09/2014    Lab Results  Component Value Date   CREATININE 1.03 08/30/2014   BUN 9 08/30/2014   NA 137 08/30/2014   K 4.2 08/30/2014   CL 103 08/30/2014   CO2 26 08/30/2014    Lab Results  Component Value Date   ALT 61* 08/30/2014   AST 72* 08/30/2014   ALKPHOS 77 08/30/2014   BILITOT 1.2 08/30/2014   INR 1.24 10/21/2014      Assessment: Chronic Hepatitis C genotype 1b  Plan: 1) Patient counseled extensively on limiting acetaminophen to no more than 2 grams daily, avoidance of alcohol. 2) Transmission discussed with patient including sexual transmission, sharing razors and toothbrush.   3)will not prescribe since he is not interested in treatment 4) Hepatitis A vaccine No. 5) Hepatitis B vaccine No.does not want anything  6) he is insistent that since he does not have pain, he does not want treatment.  I did explain that pain has nothing to do with hepatitis C and if he is in pain in the future, treatment for hepatitis C will not alleviate the pain and pain is not a sign of active disease.  He does  have cirrhosis by CT scan and this was discussed with him and should get [place] screening every 6 months.  I did tell him that cirrhosis is a risk for liver cancer and treatment would benefit him.  He continues to say he is not interested in treatment. He will call if he changes his mind.

## 2014-12-22 ENCOUNTER — Telehealth: Payer: Self-pay | Admitting: Internal Medicine

## 2014-12-22 NOTE — Telephone Encounter (Signed)
Heather: Pt is experiencing burning and itching in eyes, especially in the light. Pt is waiting for referral to eye doctor but would like to know what he can do in the meantime for example eye drops or coming in for office visit.    Alinda Sierras: Pt is following up on status of referral to ophtamologist.

## 2014-12-22 NOTE — Telephone Encounter (Signed)
I spoke to Luis Morrison and he is aware of the opthalmology referral will be process next moth because he has the orange card and they only see limited slots per month but he wants to know if the doctor can prescribe eye drops for his itchy until he see a specialist  Thank you

## 2015-03-01 ENCOUNTER — Telehealth: Payer: Self-pay

## 2015-03-01 NOTE — Telephone Encounter (Signed)
Nurse called patient, pt verified date of birth. Patient has not been to ophthalmologist.  Patient complains of itchy eyes and requests to be seen by provider. Patient is aware of wait to see provider may take several weeks. Patient is okay with that. Nurse transferred patient to front office staff to make appointment.

## 2015-03-01 NOTE — Telephone Encounter (Signed)
Nurse called patient, left message with someone answering telephone for patient to return call to [facility] with [facility] at [phone]. Nurse was calling to ask patient how he is doing with his eyes and to see if he has seen the ophthalmologist.

## 2015-03-13 ENCOUNTER — Telehealth: Payer: Self-pay | Admitting: Internal Medicine

## 2015-03-13 NOTE — Telephone Encounter (Signed)
I will sent the referral to [facility] tomorrow  because they didn't have slots in September

## 2015-03-13 NOTE — Telephone Encounter (Signed)
Patient was referred to eye doctor and is wondering if he can schedule an appt or if he would need a new referral. Please follow up with pt for advise. Thank you.

## 2015-03-21 ENCOUNTER — Ambulatory Visit: Payer: No Typology Code available for payment source | Attending: Internal Medicine

## 2015-06-01 NOTE — Telephone Encounter (Signed)
error 

## 2015-09-15 ENCOUNTER — Encounter: Payer: Self-pay | Admitting: Internal Medicine

## 2015-09-15 ENCOUNTER — Ambulatory Visit: Payer: Self-pay | Attending: Internal Medicine

## 2015-09-15 ENCOUNTER — Ambulatory Visit (HOSPITAL_BASED_OUTPATIENT_CLINIC_OR_DEPARTMENT_OTHER): Payer: Self-pay | Admitting: Internal Medicine

## 2015-09-15 VITALS — BP 140/66 | HR 84 | Temp 97.9°F | Resp 16 | Ht 70.0 in | Wt 220.8 lb

## 2015-09-15 DIAGNOSIS — Z79899 Other long term (current) drug therapy: Secondary | ICD-10-CM | POA: Insufficient documentation

## 2015-09-15 DIAGNOSIS — K7460 Unspecified cirrhosis of liver: Secondary | ICD-10-CM | POA: Insufficient documentation

## 2015-09-15 DIAGNOSIS — Z0000 Encounter for general adult medical examination without abnormal findings: Secondary | ICD-10-CM

## 2015-09-15 DIAGNOSIS — D229 Melanocytic nevi, unspecified: Secondary | ICD-10-CM

## 2015-09-15 NOTE — Progress Notes (Signed)
Patient ID: Luis Morrison, male   DOB: 19-Jan-1956, 60 y.o.   MRN: [id number]  CC: bump on forehead  HPI: Luis Morrison is a 60 y.o. male here today for a follow up visit.  Patient has past medical history of liver cirrhosis. Patient reports that he is ready to have a dermatology referral to have a mold removed from his forehead. He states that he shaved the area off himself 2 years ago and now it has returned. The area has not changes in size, shape, or color.   No Known Allergies History reviewed. No pertinent past medical history. Current Outpatient Prescriptions on File Prior to Visit  Medication Sig Dispense Refill  . albuterol (PROVENTIL) (2.5 MG/3ML) 0.083% nebulizer solution Take 2.5 mg by nebulization every 6 (six) hours as needed for wheezing or shortness of breath.     No current facility-administered medications on file prior to visit.   Family History  Problem Relation Age of Onset  . Cancer Mother    Social History   Social History  . Marital Status: Divorced    Spouse Name: N/A  . Number of Children: N/A  . Years of Education: N/A   Occupational History  . Not on file.   Social History Main Topics  . Smoking status: Never Smoker   . Smokeless tobacco: Never Used  . Alcohol Use: No  . Drug Use: No  . Sexual Activity: Not on file   Other Topics Concern  . Not on file   Social History Narrative    Review of Systems: Other than what is stated in HPI, all other systems are negative.   Objective:   Filed Vitals:   09/15/15 1425  BP: 140/66  Pulse: 84  Temp: 97.9 F (36.6 C)  Resp: 16    Physical Exam  Constitutional: He is oriented to person, place, and time.  Cardiovascular: Normal rate, regular rhythm and normal heart sounds.   Pulmonary/Chest: Effort normal.  Neurological: He is alert and oriented to person, place, and time.  Skin:  Large mole on left side of head     Lab Results  Component Value Date   WBC 6.5 09/09/2014   HGB 16.6  09/09/2014   HCT 50.2 09/09/2014   MCV 87.5 09/09/2014   PLT 147* 09/09/2014   Lab Results  Component Value Date   CREATININE 1.03 08/30/2014   BUN 9 08/30/2014   NA 137 08/30/2014   K 4.2 08/30/2014   CL 103 08/30/2014   CO2 26 08/30/2014    No results found for: HGBA1C Lipid Panel  No results found for: CHOL, TRIG, HDL, CHOLHDL, VLDL, LDLCALC     Assessment and plan:   Luis Morrison was seen today for mass and referral.  Diagnoses and all orders for this visit:  [place] -     Ambulatory referral to Dermatology  Preventative health care -     Ambulatory referral to Dentistry  Return in about 2 weeks (around 09/29/2015) for physical exam.        Lance Bosch, [place] [phone] 09/15/2015, 2:45 PM

## 2015-09-15 NOTE — Progress Notes (Signed)
Patient c/o bump on forehead.  Patient requesting a new referral to Dermatologist.

## 2015-09-15 NOTE — Patient Instructions (Signed)
Make appointment to come apply for [facility] discount letter. It is the same process as the [facility] card. Once you have it call us back and we can place a new referral for Infectious disease for the Hep C treatment.

## 2015-10-10 ENCOUNTER — Encounter: Payer: Self-pay | Admitting: Internal Medicine

## 2015-10-10 ENCOUNTER — Ambulatory Visit: Payer: No Typology Code available for payment source | Attending: Internal Medicine | Admitting: Internal Medicine

## 2015-10-10 VITALS — BP 151/76 | HR 82 | Temp 98.2°F | Wt 221.0 lb

## 2015-10-10 DIAGNOSIS — Z0001 Encounter for general adult medical examination with abnormal findings: Secondary | ICD-10-CM | POA: Insufficient documentation

## 2015-10-10 DIAGNOSIS — I10 Essential (primary) hypertension: Secondary | ICD-10-CM

## 2015-10-10 DIAGNOSIS — Z79899 Other long term (current) drug therapy: Secondary | ICD-10-CM | POA: Insufficient documentation

## 2015-10-10 DIAGNOSIS — K766 Portal hypertension: Secondary | ICD-10-CM

## 2015-10-10 DIAGNOSIS — B182 Chronic viral hepatitis C: Secondary | ICD-10-CM | POA: Insufficient documentation

## 2015-10-10 DIAGNOSIS — E559 Vitamin D deficiency, unspecified: Secondary | ICD-10-CM

## 2015-10-10 DIAGNOSIS — Z125 Encounter for screening for malignant neoplasm of prostate: Secondary | ICD-10-CM

## 2015-10-10 DIAGNOSIS — Z1211 Encounter for screening for malignant neoplasm of colon: Secondary | ICD-10-CM

## 2015-10-10 DIAGNOSIS — Z0000 Encounter for general adult medical examination without abnormal findings: Secondary | ICD-10-CM

## 2015-10-10 DIAGNOSIS — K7460 Unspecified cirrhosis of liver: Secondary | ICD-10-CM | POA: Insufficient documentation

## 2015-10-10 LAB — HEMOGLOBIN A1C
Hgb A1c MFr Bld: 6.1 % — ABNORMAL HIGH (ref <5.7)
Mean Plasma Glucose: 128 mg/dL

## 2015-10-10 LAB — CMP AND LIVER
ALT: 47 U/L — ABNORMAL HIGH (ref 9–46)
AST: 65 U/L — ABNORMAL HIGH (ref 10–35)
Albumin: 3.2 g/dL — ABNORMAL LOW (ref 3.6–5.1)
Alkaline Phosphatase: 96 U/L (ref 40–115)
BILIRUBIN DIRECT: 0.4 mg/dL — AB (ref <=0.2)
BILIRUBIN INDIRECT: 0.7 mg/dL (ref 0.2–1.2)
BUN: 8 mg/dL (ref 7–25)
CALCIUM: 8.7 mg/dL (ref 8.6–10.3)
CHLORIDE: 105 mmol/L (ref 98–110)
CO2: 23 mmol/L (ref 20–31)
Creat: 0.95 mg/dL (ref 0.70–1.25)
Glucose, Bld: 81 mg/dL (ref 65–99)
POTASSIUM: 3.8 mmol/L (ref 3.5–5.3)
Sodium: 138 mmol/L (ref 135–146)
Total Bilirubin: 1.1 mg/dL (ref 0.2–1.2)
Total Protein: 7.9 g/dL (ref 6.1–8.1)

## 2015-10-10 LAB — TSH: TSH: 3.27 m[IU]/L (ref 0.40–4.50)

## 2015-10-10 MED ORDER — FUROSEMIDE 20 MG PO TABS: 10.0000 mg | ORAL_TABLET | Freq: Every day | ORAL | Status: DC

## 2015-10-10 MED ORDER — SPIRONOLACTONE 25 MG PO TABS: 25.0000 mg | ORAL_TABLET | Freq: Every day | ORAL | Status: DC

## 2015-10-10 MED ORDER — SALINE SENSITIVE EYES SOLN: 2.0000 [drp] | Status: DC | PRN

## 2015-10-10 MED FILL — FUROSEMIDE 20 MG TABLET: 20 | 60 days supply | Qty: 30 | Fill #0

## 2015-10-10 MED FILL — SPIRONOLACTONE 25 MG TABLET: 25 | 30 days supply | Qty: 30 | Fill #0

## 2015-10-10 NOTE — Progress Notes (Signed)
Luis Morrison, is a 60 y.o. male  [id number]  [id number]  DOB - 09-14-1955  CC:  Chief Complaint  Patient presents with  . Annual Exam    Pt is not fasting       HPI: Luis Morrison is a 60 y.o. male here today to establish medical care.  PMhx significant for hep c cirrhosis.  No c/o today, denies any pain.  Her e for annual physical.  Admits to not watching his diet, lots of salt, no dietary discretion, eats a lot of canned goods.  Is now interested in referral to ID MD for consideration of Hep C treatment.  He is amendable to colonoscopy and any other screenings recommended at this time.  C/o of red eyes.   He saw eye doctor for eye glasses last month, gave him eye drops for dry eyes, asked for more. He also uses Visine prn at home.  Has dental appt scheduled for June 1, waiting for derm appt still.  Patient has No headache, No chest pain, No abdominal pain - No Nausea, No new weakness tingling or numbness, No Cough - SOB.  No Known Allergies No past medical history on file. Current Outpatient Prescriptions on File Prior to Visit  Medication Sig Dispense Refill  . albuterol (PROVENTIL) (2.5 MG/3ML) 0.083% nebulizer solution Take 2.5 mg by nebulization every 6 (six) hours as needed for wheezing or shortness of breath.     No current facility-administered medications on file prior to visit.   Family History  Problem Relation Age of Onset  . Cancer Mother    Social History   Social History  . Marital Status: Divorced    Spouse Name: N/A  . Number of Children: N/A  . Years of Education: N/A   Occupational History  . Not on file.   Social History Main Topics  . Smoking status: Never Smoker   . Smokeless tobacco: Never Used  . Alcohol Use: No  . Drug Use: No  . Sexual Activity: Not on file   Other Topics Concern  . Not on file   Social History Narrative    Review of Systems: Constitutional: Negative for fever, chills, diaphoresis, activity change, appetite  change and fatigue. HENT: Negative for ear pain, nosebleeds, congestion, facial swelling, rhinorrhea, neck pain, neck stiffness and ear discharge.  Eyes: Negative for pain, discharge, redness, itching and visual disturbance. Respiratory: Negative for cough, choking, chest tightness, shortness of breath, wheezing and stridor.  Cardiovascular: Negative for chest pain, palpitations and leg swelling. Gastrointestinal: Negative for abdominal distention.  +bloating, bms normal, denies melena/brbpr/hematochezia. Genitourinary: Negative for dysuria, urgency, frequency, hematuria, flank pain, decreased urine volume, difficulty urinating and dyspareunia.  Musculoskeletal: Negative for back pain, joint swelling, arthralgia and gait problem. Neurological: Negative for dizziness, tremors, seizures, syncope, facial asymmetry, speech difficulty, weakness, light-headedness, numbness and headaches.  Hematological: Negative for adenopathy. Does not bruise/bleed easily. Psychiatric/Behavioral: Negative for hallucinations, behavioral problems, confusion, dysphoric mood, decreased concentration and agitation.    Objective:   Filed Vitals:   10/10/15 1028  BP: 151/76  Pulse: 82  Temp: 98.2 F (36.8 C)    Physical Exam: Constitutional: Patient appears well-developed and well-nourished. No distress. AAOx3 HENT: Normocephalic, atraumatic, External right and left ear normal. Oropharynx is clear and moist.   bilat TMS clear, bilateral red sclera. Eyes: Conjunctivae and EOM are normal. PERRL, no scleral icterus. Neck: Normal ROM. Neck supple. No JVD. No tracheal deviation. No thyromegaly. CVS: RRR, S1/S2 +, no murmurs, no gallops, no  carotid bruit.  Pulmonary: Effort and breath sounds normal, no stridor, rhonchi, wheezes, rales.  Abdominal: Soft. BS +, + distension/tight abdomen, no fluid wave appreciated,  Nttp, no  rebound or guarding.  No abd masses appreciated.    Musculoskeletal: Normal range of motion. No  edema and no tenderness. Rectal exam: normal prostate, no nodules noted, nttp,  guaic neg.  CMA present during prostate exam as chaperone. LE: bilat/ no c/c/e, pulses 2+ bilateral. Lymphadenopathy: No lymphadenopathy noted, cervical Neuro: Alert.  muscle tone coordination. No cranial nerve deficit grossly. Skin: Skin is warm and dry. No rash noted. Not diaphoretic. No erythema. No pallor. Psychiatric: Normal mood and affect. Behavior, judgment, thought content normal.  Lab Results  Component Value Date   WBC 6.5 09/09/2014   HGB 16.6 09/09/2014   HCT 50.2 09/09/2014   MCV 87.5 09/09/2014   PLT 147* 09/09/2014   Lab Results  Component Value Date   CREATININE 1.03 08/30/2014   BUN 9 08/30/2014   NA 137 08/30/2014   K 4.2 08/30/2014   CL 103 08/30/2014   CO2 26 08/30/2014    No results found for: HGBA1C Lipid Panel  No results found for: CHOL, TRIG, HDL, CHOLHDL, VLDL, LDLCALC     Depression screen [facility] 2/9 10/10/2015 09/15/2015 11/16/2014 09/09/2014  Decreased Interest 0 0 0 3  Down, Depressed, Hopeless 2 0 0 1  PHQ - 2 Score 2 0 0 4  Altered sleeping 3 - - 3  Tired, decreased energy 2 - - 3  Change in appetite 0 - - 3  Feeling bad or failure about yourself  2 - - 0  Trouble concentrating 0 - - 0  Moving slowly or fidgety/restless 0 - - -  Suicidal thoughts 0 - - 2  PHQ-9 Score 9 - - 15  Difficult doing work/chores Somewhat difficult - - -   Ct abd 4/16 IMPRESSION: 1. Normal appendix. No acute or inflammatory process in the abdomen or pelvis. 2. Stable CT appearance of the liver with evidence of chronic liver disease/cirrhosis, and splenomegaly suggesting portal venous hypertension. Prominent porta hepatis lymph nodes likely are reactive. These results will be called to the ordering clinician or representative by the Radiology Department at the imaging location.   - fobt NEG (10/10/15)  Assessment and plan:   1. Chronic hepatitis C without hepatic coma (HCC) Now  interested in trx.   - CMP and Liver - US Abdomen Complete; Future - screening for hepatoca - Ambulatory referral to Infectious Disease - saw Dr Linus Salmons 11/2014 - CBC with Differential/Platelet - Hepatitis C Ab Reflex HCV RNA, QUANT - Hepatitis panel, acute  2. Hepatic cirrhosis, unspecified hepatic cirrhosis type ([place]) - start low dose spironolactone and lasix given signs of portal htn and now htn - CBC with Differential/Platelet - Hepatitis C Ab Reflex HCV RNA, QUANT - Hepatitis panel, acute  3. HTN (hypertension), benign w/ cirrhotic portal htn as well - start spironolactone and lasix today. - close monitoring - low salt/cirrhotic diet discussed w/ pt today extensively, info given. - CBC with Differential/Platelet  4. Prostate cancer screening - dw pt +/- of psa testing including specificity/sensitivity, pt amendable to testing. - PSA, Medicare  5. Colon cancer screening -amendable to screening now.  - Ambulatory referral to Gastroenterology - referral placed, colonoscopy  6. Health care maintenance chk/ - TSH - VITAMIN D 25 Hydroxy (Vit-D Deficiency, Fractures) - Hemoglobin A1c - CBC with Differential/Platelet  7. Red/dry eyes - has visine at home, trial  saline eye drops as well.  Return in about 2 months (around 12/10/2015).  For cirrhosis/htn/portal htn chk  The patient was given clear instructions to go to ER or return to medical center if symptoms don't improve, worsen or new problems develop. The patient verbalized understanding. The patient was told to call to get lab results if they haven't heard anything in the next week.      Maren Reamer, MD, [place], [place]   10/10/2015, 11:37 AM

## 2015-10-10 NOTE — Patient Instructions (Signed)
Low-Sodium Eating Plan Sodium raises blood pressure and causes water to be held in the body. Getting less sodium from food will help lower your blood pressure, reduce any swelling, and protect your heart, liver, and kidneys. We get sodium by adding salt (sodium chloride) to food. Most of our sodium comes from canned, boxed, and frozen foods. Restaurant foods, fast foods, and pizza are also very high in sodium. Even if you take medicine to lower your blood pressure or to reduce fluid in your body, getting less sodium from your food is important. WHAT IS MY PLAN? Most people should limit their sodium intake to 2,300 mg a day. Your health care provider recommends that you limit your sodium intake to __________ a day.  WHAT DO I NEED TO KNOW ABOUT THIS EATING PLAN? For the low-sodium eating plan, you will follow these general guidelines:  Choose foods with a % Daily Value for sodium of less than 5% (as listed on the food label).   Use salt-free seasonings or herbs instead of table salt or sea salt.   Check with your health care provider or pharmacist before using salt substitutes.   Eat fresh foods.  Eat more vegetables and fruits.  Limit canned vegetables. If you do use them, rinse them well to decrease the sodium.   Limit cheese to 1 oz (28 g) per day.   Eat lower-sodium products, often labeled as "lower sodium" or "no salt added."  Avoid foods that contain monosodium glutamate (MSG). MSG is sometimes added to [place] food and some canned foods.  Check food labels (Nutrition Facts labels) on foods to learn how much sodium is in one serving.  Eat more home-cooked food and less restaurant, buffet, and fast food.  When eating at a restaurant, ask that your food be prepared with less salt, or no salt if possible.  HOW DO I READ FOOD LABELS FOR SODIUM INFORMATION? The Nutrition Facts label lists the amount of sodium in one serving of the food. If you eat more than one serving, you  must multiply the listed amount of sodium by the number of servings. Food labels may also identify foods as:  Sodium free--Less than 5 mg in a serving.  Very low sodium--35 mg or less in a serving.  Low sodium--140 mg or less in a serving.  Light in sodium--50% less sodium in a serving. For example, if a food that usually has 300 mg of sodium is changed to become light in sodium, it will have 150 mg of sodium.  Reduced sodium--25% less sodium in a serving. For example, if a food that usually has 400 mg of sodium is changed to reduced sodium, it will have 300 mg of sodium. WHAT FOODS CAN I EAT? Grains Low-sodium cereals, including oats, puffed wheat and rice, and shredded wheat cereals. Low-sodium crackers. Unsalted rice and pasta. Lower-sodium bread.  Vegetables Frozen or fresh vegetables. Low-sodium or reduced-sodium canned vegetables. Low-sodium or reduced-sodium tomato sauce and paste. Low-sodium or reduced-sodium tomato and vegetable juices.  Fruits Fresh, frozen, and canned fruit. Fruit juice.  Meat and Other Protein Products Low-sodium canned tuna and salmon. Fresh or frozen meat, poultry, seafood, and fish. Lamb. Unsalted nuts. Dried beans, peas, and lentils without added salt. Unsalted canned beans. Homemade soups without salt. Eggs.  Dairy Milk. Soy milk. Ricotta cheese. Low-sodium or reduced-sodium cheeses. Yogurt.  Condiments Fresh and dried herbs and spices. Salt-free seasonings. Onion and garlic powders. Low-sodium varieties of mustard and ketchup. Fresh or refrigerated horseradish. [place]  juice.  Fats and Oils Reduced-sodium salad dressings. Unsalted butter.  Other Unsalted popcorn and pretzels.  The items listed above may not be a complete list of recommended foods or beverages. Contact your dietitian for more options. WHAT FOODS ARE NOT RECOMMENDED? Grains Instant hot cereals. Bread stuffing, pancake, and biscuit mixes. Croutons. Seasoned rice or pasta mixes.  Noodle soup cups. Boxed or frozen macaroni and cheese. Self-rising flour. Regular salted crackers. Vegetables Regular canned vegetables. Regular canned tomato sauce and paste. Regular tomato and vegetable juices. Frozen vegetables in sauces. Salted [place] fries. Olives. Angie Fava. Relishes. Sauerkraut. Salsa. Meat and Other Protein Products Salted, canned, smoked, spiced, or pickled meats, seafood, or fish. Bacon, ham, sausage, hot dogs, corned beef, chipped beef, and packaged luncheon meats. Salt pork. Jerky. Pickled herring. Anchovies, regular canned tuna, and sardines. Salted nuts. Dairy Processed cheese and cheese spreads. Cheese curds. Blue cheese and cottage cheese. Buttermilk.  Condiments Onion and garlic salt, seasoned salt, table salt, and sea salt. Canned and packaged gravies. Worcestershire sauce. Tartar sauce. Barbecue sauce. Teriyaki sauce. Soy sauce, including reduced sodium. Steak sauce. Fish sauce. Oyster sauce. Cocktail sauce. Horseradish that you find on the shelf. Regular ketchup and mustard. Meat flavorings and tenderizers. Bouillon cubes. Hot sauce. Tabasco sauce. Marinades. Taco seasonings. Relishes. Fats and Oils Regular salad dressings. Salted butter. Margarine. Ghee. Bacon fat.  Other Potato and tortilla chips. Corn chips and puffs. Salted popcorn and pretzels. Canned or dried soups. Pizza. Frozen entrees and pot pies.  The items listed above may not be a complete list of foods and beverages to avoid. Contact your dietitian for more information.  -    This information is not intended to replace advice given to you by your health care provider. Make sure you discuss any questions you have with your health care provider.   Document Released: 11/16/2001 Document Revised: 06/17/2014 Document Reviewed: 03/31/2013 Elsevier Interactive Patient Education 2016 [place] DASH stands for "Dietary Approaches to Stop Hypertension." The DASH eating  plan is a healthy eating plan that has been shown to reduce high blood pressure (hypertension). Additional health benefits may include reducing the risk of type 2 diabetes mellitus, heart disease, and stroke. The DASH eating plan may also help with weight loss. WHAT DO I NEED TO KNOW ABOUT THE DASH EATING PLAN? For the DASH eating plan, you will follow these general guidelines:  Choose foods with a percent daily value for sodium of less than 5% (as listed on the food label).  Use salt-free seasonings or herbs instead of table salt or sea salt.  Check with your health care provider or pharmacist before using salt substitutes.  Eat lower-sodium products, often labeled as "lower sodium" or "no salt added."  Eat fresh foods.  Eat more vegetables, fruits, and low-fat dairy products.  Choose whole grains. Look for the word "whole" as the first word in the ingredient list.  Choose fish and skinless chicken or [place] more often than red meat. Limit fish, poultry, and meat to 6 oz (170 g) each day.  Limit sweets, desserts, sugars, and sugary drinks.  Choose heart-healthy fats.  Limit cheese to 1 oz (28 g) per day.  Eat more home-cooked food and less restaurant, buffet, and fast food.  Limit fried foods.  Cook foods using methods other than frying.  Limit canned vegetables. If you do use them, rinse them well to decrease the sodium.  When eating at a restaurant, ask that your food be  prepared with less salt, or no salt if possible. WHAT FOODS CAN I EAT? Seek help from a dietitian for individual calorie needs. Grains Whole grain or whole wheat bread. Brown rice. Whole grain or whole wheat pasta. Quinoa, bulgur, and whole grain cereals. Low-sodium cereals. Corn or whole wheat flour tortillas. Whole grain cornbread. Whole grain crackers. Low-sodium crackers. Vegetables Fresh or frozen vegetables (raw, steamed, roasted, or grilled). Low-sodium or reduced-sodium tomato and vegetable juices.  Low-sodium or reduced-sodium tomato sauce and paste. Low-sodium or reduced-sodium canned vegetables.  Fruits All fresh, canned (in natural juice), or frozen fruits. Meat and Other Protein Products Ground beef (85% or leaner), grass-fed beef, or beef trimmed of fat. Skinless chicken or [place]. Ground chicken or [place]. Pork trimmed of fat. All fish and seafood. Eggs. Dried beans, peas, or lentils. Unsalted nuts and seeds. Unsalted canned beans. Dairy Low-fat dairy products, such as skim or 1% milk, 2% or reduced-fat cheeses, low-fat ricotta or cottage cheese, or plain low-fat yogurt. Low-sodium or reduced-sodium cheeses. Fats and Oils Tub margarines without trans fats. Light or reduced-fat mayonnaise and salad dressings (reduced sodium). Avocado. Safflower, olive, or canola oils. Natural peanut or almond butter. Other Unsalted popcorn and pretzels. The items listed above may not be a complete list of recommended foods or beverages. Contact your dietitian for more options. WHAT FOODS ARE NOT RECOMMENDED? Grains White bread. White pasta. White rice. Refined cornbread. Bagels and croissants. Crackers that contain trans fat. Vegetables Creamed or fried vegetables. Vegetables in a cheese sauce. Regular canned vegetables. Regular canned tomato sauce and paste. Regular tomato and vegetable juices. Fruits Dried fruits. Canned fruit in light or heavy syrup. Fruit juice. Meat and Other Protein Products Fatty cuts of meat. Ribs, chicken wings, bacon, sausage, bologna, salami, chitterlings, fatback, hot dogs, bratwurst, and packaged luncheon meats. Salted nuts and seeds. Canned beans with salt. Dairy Whole or 2% milk, cream, half-and-half, and cream cheese. Whole-fat or sweetened yogurt. Full-fat cheeses or blue cheese. Nondairy creamers and whipped toppings. Processed cheese, cheese spreads, or cheese curds. Condiments Onion and garlic salt, seasoned salt, table salt, and sea salt. Canned and packaged  gravies. Worcestershire sauce. Tartar sauce. Barbecue sauce. Teriyaki sauce. Soy sauce, including reduced sodium. Steak sauce. Fish sauce. Oyster sauce. Cocktail sauce. Horseradish. Ketchup and mustard. Meat flavorings and tenderizers. Bouillon cubes. Hot sauce. Tabasco sauce. Marinades. Taco seasonings. Relishes. Fats and Oils Butter, stick margarine, lard, shortening, ghee, and bacon fat. Coconut, palm kernel, or palm oils. Regular salad dressings. Other Pickles and olives. Salted popcorn and pretzels. The items listed above may not be a complete list of foods and beverages to avoid. Contact your dietitian for more information. WHERE CAN I FIND MORE INFORMATION? National Heart, Lung, and Blood Institute: travelstabloid.com   This information is not intended to replace advice given to you by your health care provider. Make sure you discuss any questions you have with your health care provider.   Document Released: 05/16/2011 Document Revised: 06/17/2014 Document Reviewed: 03/31/2013 Elsevier Interactive Patient Education 2016 [employer]. . - Cirrhosis Cirrhosis is long-term (chronic) liver injury. The liver is your largest internal organ, and it performs many functions. The liver converts food into energy, removes toxic material from your blood, makes important proteins, and absorbs necessary vitamins from your diet. If you have cirrhosis, it means many of your healthy liver cells have been replaced by scar tissue. This prevents blood from flowing through your liver, which makes it difficult for your liver to function. This scarring is  not reversible, but treatment can prevent it from getting worse.  CAUSES  Hepatitis C and long-term alcohol abuse are the most common causes of cirrhosis. Other causes include:  Nonalcoholic fatty liver disease.  Hepatitis B infection.  Autoimmune hepatitis.  Diseases that cause blockage of ducts inside the  liver.  Inherited liver diseases.  Reactions to certain long-term medicines.  Parasitic infections.  Long-term exposure to certain toxins. RISK FACTORS You may have a higher risk of cirrhosis if you:  Have certain hepatitis viruses.  Abuse alcohol, especially if you are male.  Are overweight.  Share needles.  Have unprotected sex with someone who has hepatitis. SYMPTOMS  You may not have any signs and symptoms at first. Symptoms may not develop until the damage to your liver starts to get worse. Signs and symptoms of cirrhosis may include:   Tenderness in the right-upper part of your abdomen.  Weakness and tiredness (fatigue).  Loss of appetite.  Nausea.  Weight loss and muscle loss.  Itchiness.  Yellow skin and eyes (jaundice).  Buildup of fluid in the abdomen (ascites).  Swelling of the feet and ankles (edema).  Appearance of tiny blood vessels under the skin.  Mental confusion.  Easy bruising and bleeding. DIAGNOSIS  Your health care provider may suspect cirrhosis based on your symptoms and medical history, especially if you have other medical conditions or a history of alcohol abuse. Your health care provider will do a physical exam to feel your liver and check for signs of cirrhosis. Your health care provider may perform other tests, including:   Blood tests to check:   Whether you have hepatitis B or C.   Kidney function.  Liver function.  Imaging tests such as:  MRI or CT scan to look for changes seen in advanced cirrhosis.  Ultrasound to see if normal liver tissue is being replaced by scar tissue.  A procedure using a long needle to take a sample of liver tissue (biopsy) for examination under a microscope. Liver biopsy can confirm the diagnosis of cirrhosis.  TREATMENT  Treatment depends on how damaged your liver is and what caused the damage. Treatment may include treating cirrhosis symptoms or treating the underlying causes of the  condition to try to slow the progression of the damage. Treatment may include:  Making lifestyle changes, such as:   Eating a healthy diet.  Restricting salt intake.  Maintaining a healthy weight.   Not abusing drugs or alcohol.  Taking medicines to:  Treat liver infections or other infections.  Control itching.  Reduce fluid buildup.  Reduce certain blood toxins.  Reduce risk of bleeding from enlarged blood vessels in the stomach or esophagus (varices).  If varices are causing bleeding problems, you may need treatment with a procedure that ties up the vessels causing them to fall off (band ligation).  If cirrhosis is causing your liver to fail, your health care provider may recommend a liver transplant.  Other treatments may be recommended depending on any complications of cirrhosis, such as liver-related kidney failure (hepatorenal syndrome). HOME CARE INSTRUCTIONS   Take medicines only as directed by your health care provider. Do not use drugs that are toxic to your liver. Ask your health care provider before taking any new medicines, including over-the-counter medicines.   Rest as needed.  Eat a well-balanced diet. Ask your health care provider or dietitian for more information.   You may have to follow a low-salt diet or restrict your water intake as directed.  Do  not drink alcohol. This is especially important if you are taking acetaminophen.  Keep all follow-up visits as directed by your health care provider. This is important. SEEK MEDICAL CARE IF:  You have fatigue or weakness that is getting worse.  You develop swelling of the hands, feet, legs, or face.  You have a fever.  You develop loss of appetite.  You have nausea or vomiting.  You develop jaundice.  You develop easy bruising or bleeding. SEEK IMMEDIATE MEDICAL CARE IF:  You vomit bright red blood or a material that looks like coffee grounds.  You have blood in your stools.  Your  stools appear black and tarry.  You become confused.  You have chest pain or trouble breathing.   This information is not intended to replace advice given to you by your health care provider. Make sure you discuss any questions you have with your health care provider.   Document Released: 05/27/2005 Document Revised: 06/17/2014 Document Reviewed: 02/02/2014 Elsevier Interactive Patient Education [employer].

## 2015-10-11 LAB — CBC WITH DIFFERENTIAL/PLATELET
BASOS PCT: 0 %
Basophils Absolute: 0 cells/uL (ref 0–200)
EOS ABS: 168 {cells}/uL (ref 15–500)
Eosinophils Relative: 3 %
HEMATOCRIT: 46.8 % (ref 38.5–50.0)
HEMOGLOBIN: 15.2 g/dL (ref 13.2–17.1)
LYMPHS ABS: 1792 {cells}/uL (ref 850–3900)
Lymphocytes Relative: 32 %
MCH: 29.3 pg (ref 27.0–33.0)
MCHC: 32.5 g/dL (ref 32.0–36.0)
MCV: 90.2 fL (ref 80.0–100.0)
MONO ABS: 560 {cells}/uL (ref 200–950)
Monocytes Relative: 10 %
Neutro Abs: 3080 cells/uL (ref 1500–7800)
Neutrophils Relative %: 55 %
Platelets: 57 10*3/uL — ABNORMAL LOW (ref 140–400)
RBC: 5.19 MIL/uL (ref 4.20–5.80)
RDW: 14.9 % (ref 11.0–15.0)
WBC: 5.6 10*3/uL (ref 3.8–10.8)

## 2015-10-11 LAB — PSA, MEDICARE: PSA: 1.41 ng/mL (ref <=4.00)

## 2015-10-11 LAB — HEPATITIS PANEL, ACUTE
HCV Ab: REACTIVE — AB
HEP A IGM: NONREACTIVE
HEP B S AG: NEGATIVE
Hep B C IgM: NONREACTIVE

## 2015-10-11 LAB — VITAMIN D 25 HYDROXY (VIT D DEFICIENCY, FRACTURES): VIT D 25 HYDROXY: 8 ng/mL — AB (ref 30–100)

## 2015-10-11 LAB — HEPATITIS C ANTIBODY: HCV AB: REACTIVE — AB

## 2015-10-12 LAB — HEPATITIS C RNA QUANTITATIVE
HCV QUANT: 898569 [IU]/mL — AB (ref <15)
HCV Quantitative Log: 5.95 {Log} — ABNORMAL HIGH (ref <1.18)

## 2015-10-14 MED ORDER — VITAMIN D (ERGOCALCIFEROL) 1.25 MG (50000 UNIT) PO CAPS: 50000.0000 [IU] | ORAL_CAPSULE | ORAL | Status: DC

## 2015-10-14 NOTE — Addendum Note (Signed)
Addended byLottie Mussel T on: 10/14/2015 11:37 AM   Modules accepted: Orders, SmartSet

## 2015-10-16 MED FILL — VIT D2 1.25 MG (50,000 UNIT: 1.25 MG | 84 days supply | Qty: 12 | Fill #0

## 2015-10-17 ENCOUNTER — Telehealth: Payer: Self-pay | Admitting: Internal Medicine

## 2015-10-17 NOTE — Telephone Encounter (Signed)
Pt dont have insurance I send a letter to patient with application to apply for the [employer] discount to be refer to a Gi specialist. The [employer] don't cover colonoscopy  Thanks

## 2015-10-17 NOTE — Telephone Encounter (Signed)
Pt dont have insurance I send a letter to patient with application to apply for the [employer] discount to be refer to a Gi specialist.

## 2015-10-17 NOTE — Telephone Encounter (Signed)
Patient has [employer] card. Needs referral to GI

## 2015-10-17 NOTE — Telephone Encounter (Signed)
Patient needs referral to gastroenterology. Patient has [employer] card

## 2015-10-20 ENCOUNTER — Ambulatory Visit (HOSPITAL_COMMUNITY)
Admission: RE | Admit: 2015-10-20 | Discharge: 2015-10-20 | Disposition: A | Discharge status: Home / Self Care | Payer: Self-pay | Source: Ambulatory Visit | Attending: Internal Medicine | Admitting: Internal Medicine

## 2015-10-20 DIAGNOSIS — B182 Chronic viral hepatitis C: Secondary | ICD-10-CM | POA: Insufficient documentation

## 2015-10-31 ENCOUNTER — Telehealth: Payer: Self-pay | Admitting: *Deleted

## 2015-10-31 NOTE — Telephone Encounter (Signed)
-----   Message from Maren Reamer, MD sent at 10/20/2015 10:25 AM EDT ----- Please call pt w/ [place] results.   Noted hep c cirrhosis, but no masses/concerning lesions noted on liver.  Will need repeat in 75months for biannual surveillance. Thanks.

## 2015-10-31 NOTE — Telephone Encounter (Signed)
Patient verified DOB Patient is aware of Hep C being noted with no masses or lesions being present on the liver. Patient aware of having a repeat completed in 6 months. Patient had no further questions at this time.

## 2015-11-07 ENCOUNTER — Ambulatory Visit: Payer: No Typology Code available for payment source | Attending: Internal Medicine

## 2015-11-07 MED FILL — FUROSEMIDE 20 MG TABLET: 20 | 60 days supply | Qty: 30 | Fill #1

## 2015-11-07 MED FILL — SPIRONOLACTONE 25 MG TABLET: 25 | 30 days supply | Qty: 30 | Fill #1

## 2015-11-29 IMAGING — CT CT ABD-PELV W/ CM
2 of 5 series · 4 of 46 positions shown, 6 images · IV contrast (Iodine)
Comparison: CT Abdomen and Pelvis 08/30/2014.

CLINICAL DATA: 58-year-old male with severe abdominal pain
continues. Subsequent encounter.

EXAM:
CT ABDOMEN AND PELVIS WITH CONTRAST
TECHNIQUE: Multidetector CT imaging of the abdomen and pelvis was performed
using the standard protocol following bolus administration of
intravenous contrast.
CONTRAST:  80mL OMNIPAQUE IOHEXOL 300 MG/ML  SOLN

[Series 204: cor · coronal · 0.50mm/px · 3 of 83 slices shown, 4 images]
[im 19/83  soft-tissue]
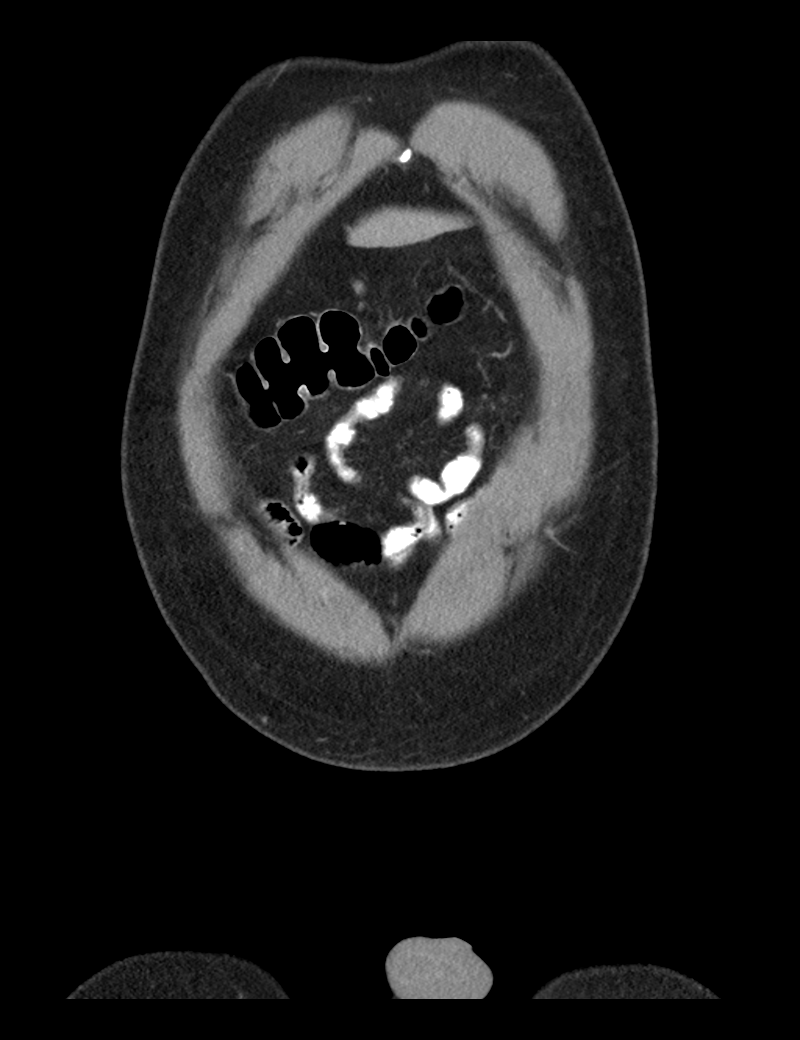
[im 19/83  bone]
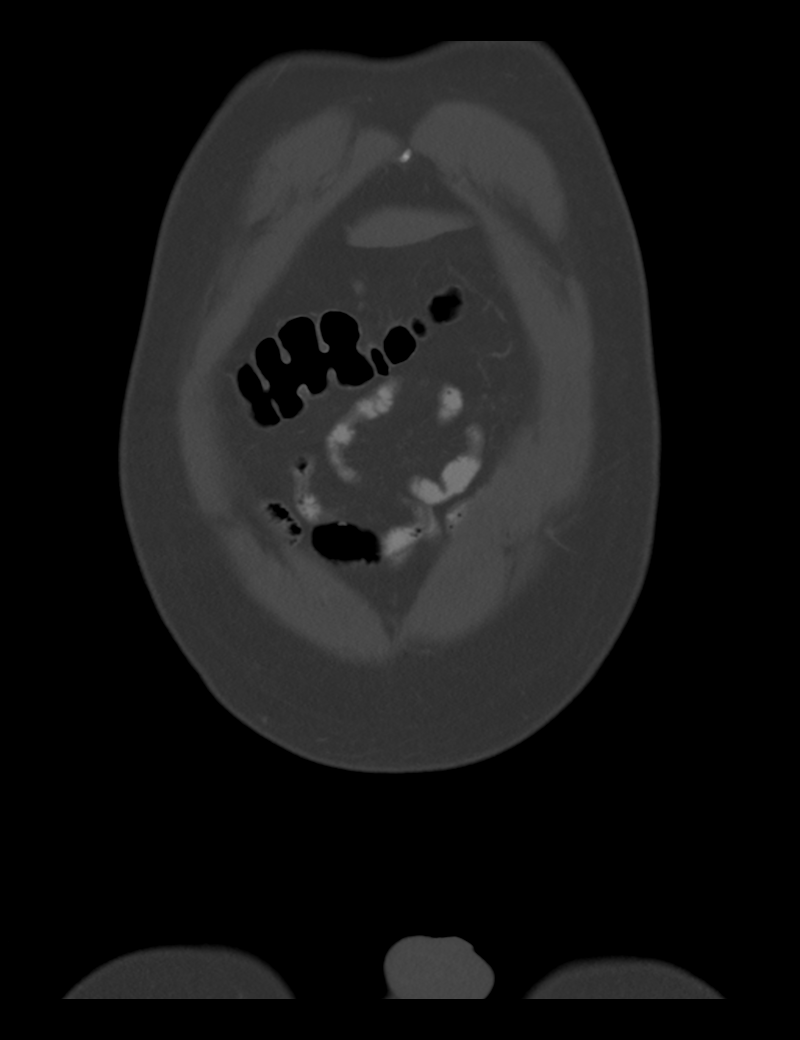
[im 46/83  soft-tissue]
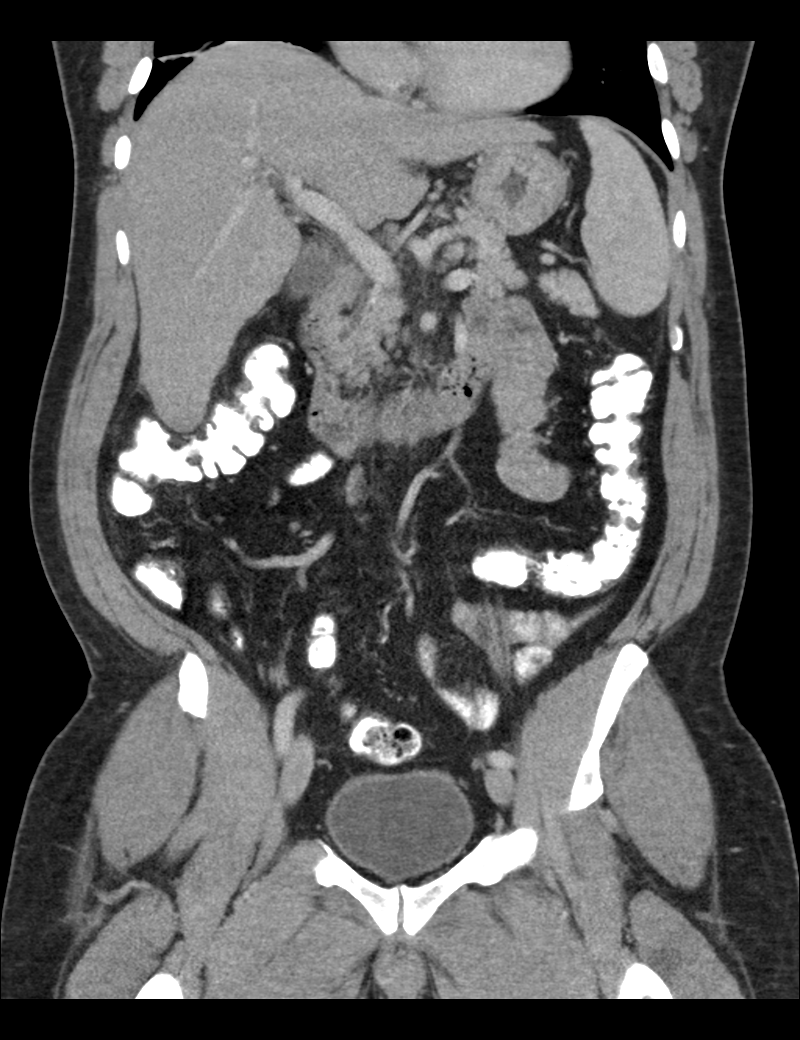
[im 64/83  soft-tissue]
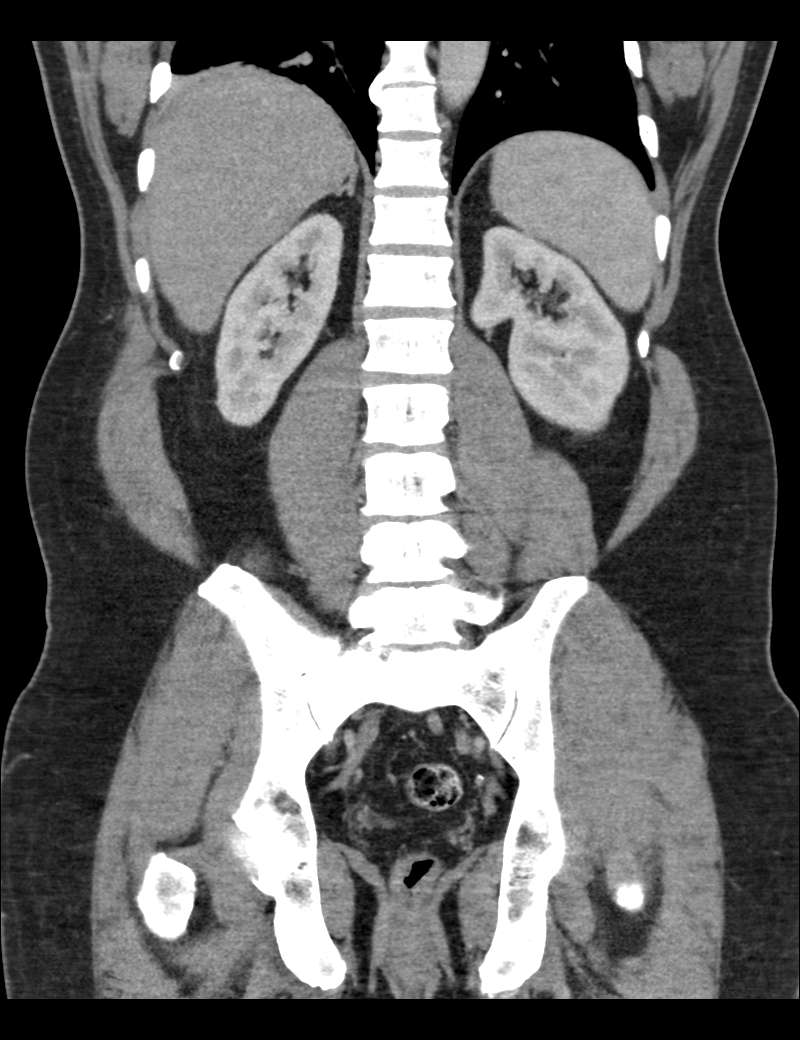

[Series 205: batch 2 · sagittal · 0.50mm/px · 1 of 108 slices shown, 2 images]
[im 36/108  soft-tissue]
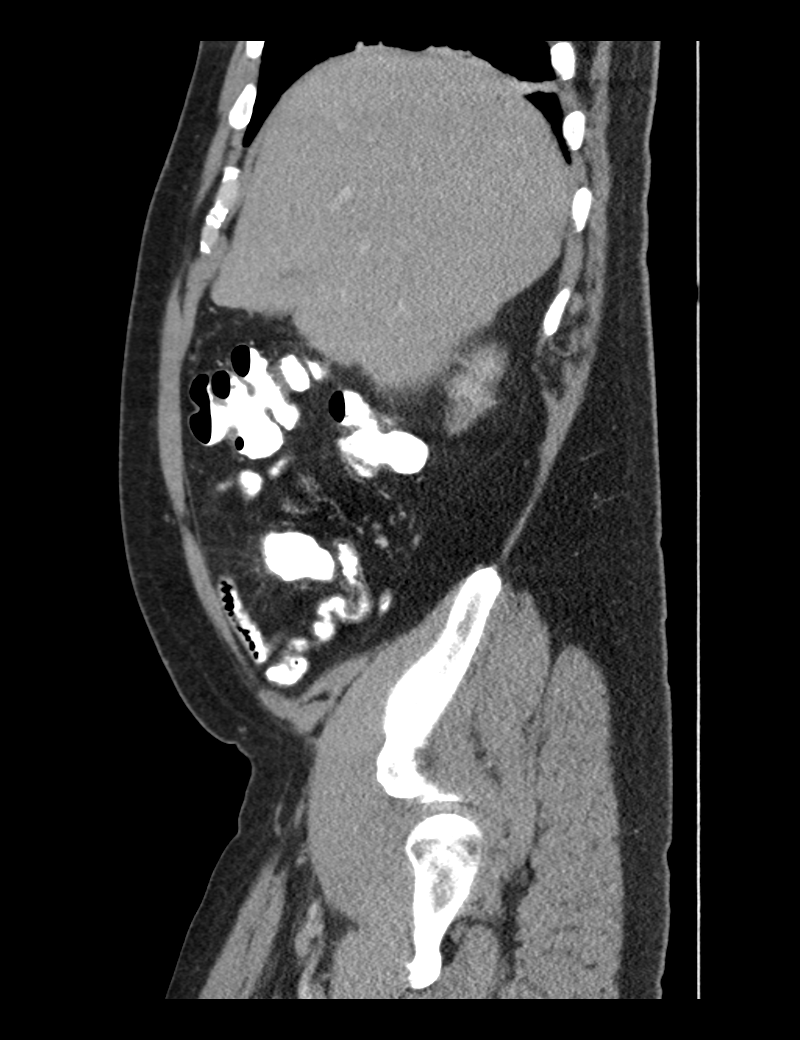
[im 36/108  bone]
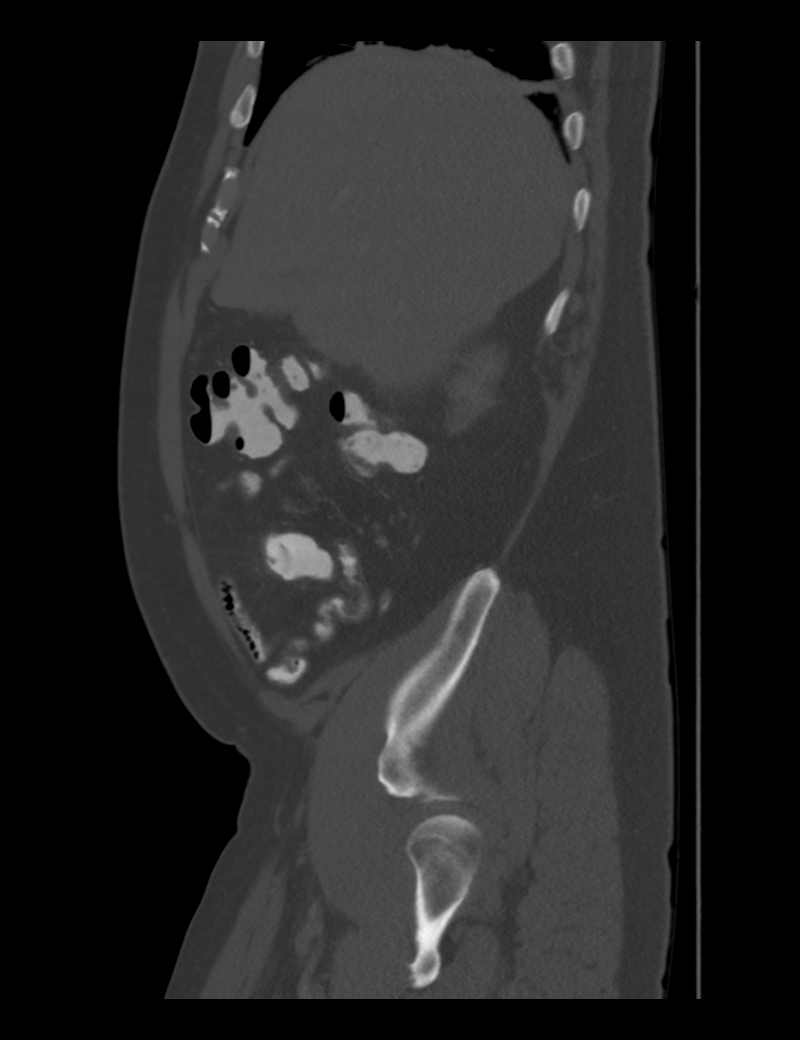

[4 of 46 positions shown; findings below may reference images not displayed]

FINDINGS: Stable mild cardiomegaly. No pericardial or pleural effusion. Stable
mild atelectasis at the right lung base.

Advanced chronic L4-L5 disc degeneration with vacuum disc again
noted. No acute osseous abnormality identified.

No pelvic free fluid. Diminutive, negative bladder. Negative rectum.
Oral contrast has almost reached the rectum.

Redundant but otherwise negative sigmoid colon. Left colon,
transverse colon and right colon are normal. The appendix is normal
today, containing contrast and air, with no wall thickening or
surrounding inflammation.

Negative terminal ileum. No dilated small bowel. The stomach is more
decompressed. The duodenum is within normal limits.

Stable CT appearance of the liver. Subtle nodularity of the liver
contour again noted. Unchanged maximal porta hepatis lymph nodes. No
perihepatic free fluid. The portal venous system is patent.

No gallbladder inflammation identified. Stable splenomegaly with
estimated splenic volume of 680 mL (normal splenic volume range 83 -
412 mL). The splenic vein is patent.

Pancreas and adrenal glands are stable and within normal limits.
Major arterial structures are patent. No abdominal free fluid.
Bilateral renal enhancement and contrast excretion is normal.
IMPRESSION: 1. Normal appendix. No acute or inflammatory process in the abdomen
or pelvis.
2. Stable CT appearance of the liver with evidence of chronic liver
disease/cirrhosis, and splenomegaly suggesting portal venous
hypertension. Prominent porta hepatis lymph nodes likely are
reactive.
These results will be called to the ordering clinician or
representative by the [HOSPITAL] at the imaging location.

## 2015-12-06 ENCOUNTER — Telehealth: Payer: Self-pay | Admitting: Internal Medicine

## 2015-12-06 ENCOUNTER — Encounter: Payer: Self-pay | Admitting: Internal Medicine

## 2015-12-06 ENCOUNTER — Ambulatory Visit: Payer: No Typology Code available for payment source | Attending: Internal Medicine | Admitting: Internal Medicine

## 2015-12-06 ENCOUNTER — Encounter: Payer: Self-pay | Admitting: Gastroenterology

## 2015-12-06 VITALS — BP 136/88 | HR 78 | Temp 98.0°F | Resp 16 | Ht 70.0 in | Wt 217.0 lb

## 2015-12-06 DIAGNOSIS — K7460 Unspecified cirrhosis of liver: Secondary | ICD-10-CM | POA: Insufficient documentation

## 2015-12-06 DIAGNOSIS — Z23 Encounter for immunization: Secondary | ICD-10-CM

## 2015-12-06 DIAGNOSIS — B182 Chronic viral hepatitis C: Secondary | ICD-10-CM | POA: Insufficient documentation

## 2015-12-06 DIAGNOSIS — E559 Vitamin D deficiency, unspecified: Secondary | ICD-10-CM | POA: Insufficient documentation

## 2015-12-06 DIAGNOSIS — Z1211 Encounter for screening for malignant neoplasm of colon: Secondary | ICD-10-CM

## 2015-12-06 DIAGNOSIS — Z79899 Other long term (current) drug therapy: Secondary | ICD-10-CM | POA: Insufficient documentation

## 2015-12-06 MED FILL — SPIRONOLACTONE 25 MG TABLET: 25 | 30 days supply | Qty: 30 | Fill #2

## 2015-12-06 NOTE — Patient Instructions (Addendum)
Heart-Healthy Eating Plan °Many factors influence your heart health, including eating and exercise habits. Heart (coronary) risk increases with abnormal blood fat (lipid) levels. Heart-healthy meal planning includes limiting unhealthy fats, increasing healthy fats, and making other small dietary changes. This includes maintaining a healthy body weight to help keep lipid levels within a normal range. °WHAT IS MY PLAN?  °Your health care provider recommends that you: °· Get no more than _________% of the total calories in your daily diet from fat. °· Limit your intake of saturated fat to less than _________% of your total calories each day. °· Limit the amount of cholesterol in your diet to less than _________ mg per day. °WHAT TYPES OF FAT SHOULD I CHOOSE? °· Choose healthy fats more often. Choose monounsaturated and polyunsaturated fats, such as olive oil and canola oil, flaxseeds, walnuts, almonds, and seeds. °· Eat more omega-3 fats. Good choices include salmon, mackerel, sardines, tuna, flaxseed oil, and ground flaxseeds. Aim to eat fish at least two times each week. °· Limit saturated fats. Saturated fats are primarily found in animal products, such as meats, butter, and cream. Plant sources of saturated fats include palm oil, palm kernel oil, and coconut oil. °· Avoid foods with partially hydrogenated oils in them. These contain trans fats. Examples of foods that contain trans fats are stick margarine, some tub margarines, cookies, crackers, and other baked goods. °WHAT GENERAL GUIDELINES DO I NEED TO FOLLOW? °· Check food labels carefully to identify foods with trans fats or high amounts of saturated fat. °· Fill one half of your plate with vegetables and green salads. Eat 4-5 servings of vegetables per day. A serving of vegetables equals 1 cup of raw leafy vegetables, ½ cup of raw or cooked cut-up vegetables, or ½ cup of vegetable juice. °· Fill one fourth of your plate with whole grains. Look for the word  "whole" as the first word in the ingredient list. °· Fill one fourth of your plate with lean protein foods. °· Eat 4-5 servings of fruit per day. A serving of fruit equals one medium whole fruit, ¼ cup of dried fruit, ½ cup of fresh, frozen, or canned fruit, or ½ cup of 100% fruit juice. °· Eat more foods that contain soluble fiber. Examples of foods that contain this type of fiber are apples, broccoli, carrots, beans, peas, and barley. Aim to get 20-30 g of fiber per day. °· Eat more home-cooked food and less restaurant, buffet, and fast food. °· Limit or avoid alcohol. °· Limit foods that are high in starch and sugar. °· Avoid fried foods. °· Cook foods by using methods other than frying. Baking, boiling, grilling, and broiling are all great options. Other fat-reducing suggestions include: °¨ Removing the skin from poultry. °¨ Removing all visible fats from meats. °¨ Skimming the fat off of stews, soups, and gravies before serving them. °¨ Steaming vegetables in water or broth. °· Lose weight if you are overweight. Losing just 5-10% of your initial body weight can help your overall health and prevent diseases such as diabetes and heart disease. °· Increase your consumption of nuts, legumes, and seeds to 4-5 servings per week. One serving of dried beans or legumes equals ½ cup after being cooked, one serving of nuts equals 1½ ounces, and one serving of seeds equals ½ ounce or 1 tablespoon. °· You may need to monitor your salt (sodium) intake, especially if you have high blood pressure. Talk with your health care provider or dietitian to get   more information about reducing sodium. °WHAT FOODS CAN I EAT? °Grains °Breads, including French, white, pita, wheat, raisin, rye, oatmeal, and Italian. Tortillas that are neither fried nor made with lard or trans fat. Low-fat rolls, including hotdog and hamburger buns and English muffins. Biscuits. Muffins. Waffles. Pancakes. Light popcorn. Whole-grain cereals. Flatbread. Melba  toast. Pretzels. Breadsticks. Rusks. Low-fat snacks and crackers, including oyster, saltine, matzo, graham, animal, and rye. Rice and pasta, including brown rice and those that are made with whole wheat. °Vegetables °All vegetables. °Fruits °All fruits, but limit coconut. °Meats and Other Protein Sources °Lean, well-trimmed beef, veal, pork, and lamb. Chicken and turkey without skin. All fish and shellfish. Wild duck, rabbit, pheasant, and venison. Egg whites or low-cholesterol egg substitutes. Dried beans, peas, lentils, and tofu. Seeds and most nuts. °Dairy °Low-fat or nonfat cheeses, including ricotta, string, and mozzarella. Skim or 1% milk that is liquid, powdered, or evaporated. Buttermilk that is made with low-fat milk. Nonfat or low-fat yogurt. °Beverages °Mineral water. Diet carbonated beverages. °Sweets and Desserts °Sherbets and fruit ices. Honey, jam, marmalade, jelly, and syrups. Meringues and gelatins. Pure sugar candy, such as hard candy, jelly beans, gumdrops, mints, marshmallows, and small amounts of dark chocolate. Angel food cake. °Eat all sweets and desserts in moderation. °Fats and Oils °Nonhydrogenated (trans-free) margarines. Vegetable oils, including soybean, sesame, sunflower, olive, peanut, safflower, corn, canola, and cottonseed. Salad dressings or mayonnaise that are made with a vegetable oil. Limit added fats and oils that you use for cooking, baking, salads, and as spreads. °Other °Cocoa powder. Coffee and tea. All seasonings and condiments. °The items listed above may not be a complete list of recommended foods or beverages. Contact your dietitian for more options. °WHAT FOODS ARE NOT RECOMMENDED? °Grains °Breads that are made with saturated or trans fats, oils, or whole milk. Croissants. Butter rolls. Cheese breads. Sweet rolls. Donuts. Buttered popcorn. Chow mein noodles. High-fat crackers, such as cheese or butter crackers. °Meats and Other Protein Sources °Fatty meats, such as  hotdogs, short ribs, sausage, spareribs, bacon, ribeye roast or steak, and mutton. High-fat deli meats, such as salami and bologna. Caviar. Domestic duck and goose. Organ meats, such as kidney, liver, sweetbreads, brains, gizzard, chitterlings, and heart. °Dairy °Cream, sour cream, cream cheese, and creamed cottage cheese. Whole milk cheeses, including blue (bleu), Monterey Jack, Brie, Colby, American, Havarti, Swiss, cheddar, Camembert, and [place].  Whole or 2% milk that is liquid, evaporated, or condensed. Whole buttermilk. Cream sauce or high-fat cheese sauce. Yogurt that is made from whole milk. °Beverages °Regular sodas and drinks with added sugar. °Sweets and Desserts °Frosting. Pudding. Cookies. Cakes other than angel food cake. Candy that has milk chocolate or white chocolate, hydrogenated fat, butter, coconut, or unknown ingredients. Buttered syrups. Full-fat ice cream or ice cream drinks. °Fats and Oils °Gravy that has suet, meat fat, or shortening. Cocoa butter, hydrogenated oils, palm oil, coconut oil, palm kernel oil. These can often be found in baked products, candy, fried foods, nondairy creamers, and whipped toppings. Solid fats and shortenings, including bacon fat, salt pork, lard, and butter. Nondairy cream substitutes, such as coffee creamers and sour cream substitutes. Salad dressings that are made of unknown oils, cheese, or sour cream. °The items listed above may not be a complete list of foods and beverages to avoid. Contact your dietitian for more information. °  °This information is not intended to replace advice given to you by your health care provider. Make sure you discuss any questions you have with your health   care provider.   Document Released: 03/05/2008 Document Revised: 06/17/2014 Document Reviewed: 11/18/2013 Elsevier Interactive Patient Education 2016 [employer]. Tdap Vaccine (Tetanus, Diphtheria and Pertussis): What You Need to Know  1. Why get vaccinated? Tetanus,  diphtheria and pertussis are very serious diseases. Tdap vaccine can protect [place] from these diseases. And, Tdap vaccine given to pregnant women can protect newborn babies against pertussis. TETANUS (Lockjaw) is rare in the [place] today. It causes painful muscle tightening and stiffness, usually all over the body.  It can lead to tightening of muscles in the head and neck so you can't open your mouth, swallow, or sometimes even breathe. Tetanus kills about 1 out of 10 people who are infected even after receiving the best medical care. DIPHTHERIA is also rare in the [place] today. It can cause a thick coating to form in the back of the throat.  It can lead to breathing problems, heart failure, paralysis, and death. PERTUSSIS (Whooping Cough) causes severe coughing spells, which can cause difficulty breathing, vomiting and disturbed sleep.  It can also lead to weight loss, incontinence, and rib fractures. Up to 2 in 100 adolescents and 5 in 100 adults with pertussis are hospitalized or have complications, which could include pneumonia or death. These diseases are caused by bacteria. Diphtheria and pertussis are spread from person to person through secretions from coughing or sneezing. Tetanus enters the body through cuts, scratches, or wounds. Before vaccines, as many as 200,000 cases of diphtheria, 200,000 cases of pertussis, and hundreds of cases of tetanus, were reported in the [place] each year. Since vaccination began, reports of cases for tetanus and diphtheria have dropped by about 99% and for pertussis by about 80%. 2. Tdap vaccine Tdap vaccine can protect adolescents and adults from tetanus, diphtheria, and pertussis. One dose of Tdap is routinely given at age 28 or 3. People who did not get Tdap at that age should get it as soon as possible. Tdap is especially important for healthcare professionals and anyone having close contact with a baby younger than 12 months. Pregnant  women should get a dose of Tdap during every pregnancy, to protect the newborn from pertussis. Infants are most at risk for severe, life-threatening complications from pertussis. Another vaccine, called Td, protects against tetanus and diphtheria, but not pertussis. A Td booster should be given every 10 years. Tdap may be given as one of these boosters if you have never gotten Tdap before. Tdap may also be given after a severe cut or burn to prevent tetanus infection. Your doctor or the person giving you the vaccine can give you more information. Tdap may safely be given at the same time as other vaccines. 3. Some people should not get this vaccine  A person who has ever had a life-threatening allergic reaction after a previous dose of any diphtheria, tetanus or pertussis containing vaccine, OR has a severe allergy to any part of this vaccine, should not get Tdap vaccine. Tell the person giving the vaccine about any severe allergies.  Anyone who had coma or long repeated seizures within 7 days after a childhood dose of DTP or DTaP, or a previous dose of Tdap, should not get Tdap, unless a cause other than the vaccine was found. They can still get Td.  Talk to your doctor if you:  have seizures or another nervous system problem,  had severe pain or swelling after any vaccine containing diphtheria, tetanus or pertussis,  ever had a condition called Guillain-Barr  Syndrome (GBS),  aren't feeling well on the day the shot is scheduled. 4. Risks With any medicine, including vaccines, there is a chance of side effects. These are usually mild and go away on their own. Serious reactions are also possible but are rare. Most people who get Tdap vaccine do not have any problems with it. Mild problems following Tdap (Did not interfere with activities)  Pain where the shot was given (about 3 in 4 adolescents or 2 in 3 adults)  Redness or swelling where the shot was given (about 1 person in 5)  Mild  fever of at least 100.50F (up to about 1 in 25 adolescents or 1 in 100 adults)  Headache (about 3 or 4 people in 10)  Tiredness (about 1 person in 3 or 4)  Nausea, vomiting, diarrhea, stomach ache (up to 1 in 4 adolescents or 1 in 10 adults)  Chills, sore joints (about 1 person in 10)  Body aches (about 1 person in 3 or 4)  Rash, swollen glands (uncommon) Moderate problems following Tdap (Interfered with activities, but did not require medical attention)  Pain where the shot was given (up to 1 in 5 or 6)  Redness or swelling where the shot was given (up to about 1 in 16 adolescents or 1 in 12 adults)  Fever over 102F (about 1 in 100 adolescents or 1 in 250 adults)  Headache (about 1 in 7 adolescents or 1 in 10 adults)  Nausea, vomiting, diarrhea, stomach ache (up to 1 or 3 people in 100)  Swelling of the entire arm where the shot was given (up to about 1 in 500). Severe problems following Tdap (Unable to perform usual activities; required medical attention)  Swelling, severe pain, bleeding and redness in the arm where the shot was given (rare). Problems that could happen after any vaccine:  People sometimes faint after a medical procedure, including vaccination. Sitting or lying down for about 15 minutes can help prevent fainting, and injuries caused by a fall. Tell your doctor if you feel dizzy, or have vision changes or ringing in the ears.  Some people get severe pain in the shoulder and have difficulty moving the arm where a shot was given. This happens very rarely.  Any medication can cause a severe allergic reaction. Such reactions from a vaccine are very rare, estimated at fewer than 1 in a million doses, and would happen within a few minutes to a few hours after the vaccination. As with any medicine, there is a very remote chance of a vaccine causing a serious injury or death. The safety of vaccines is always being monitored. For more information, visit:  http://www.aguilar.org/ 5. What if there is a serious problem? What should I look for?  Look for anything that concerns you, such as signs of a severe allergic reaction, very high fever, or unusual behavior.  Signs of a severe allergic reaction can include hives, swelling of the face and throat, difficulty breathing, a fast heartbeat, dizziness, and weakness. These would usually start a few minutes to a few hours after the vaccination. What should I do?  If you think it is a severe allergic reaction or other emergency that can't wait, call 9-1-1 or get the person to the nearest hospital. Otherwise, call your doctor.  Afterward, the reaction should be reported to the Vaccine Adverse Event Reporting System (VAERS). Your doctor might file this report, or you can do it yourself through the VAERS web site at www.vaers.SamedayNews.es, or by  calling [phone]. VAERS does not give medical advice.  6. The National Vaccine Injury Compensation Program The [employer] Vaccine Injury Compensation Program (VICP) is a federal program that was created to compensate people who may have been injured by certain vaccines. Persons who believe they may have been injured by a vaccine can learn about the program and about filing a claim by calling 865-776-5738 or visiting the [place] website at GoldCloset.com.ee. There is a time limit to file a claim for compensation. 7. How can I learn more?  Ask your doctor. He or she can give you the vaccine package insert or suggest other sources of information.  Call your local or state health department.  Contact the Centers for Disease Control and Prevention (CDC):  Call (608) 874-3572 (1-800-CDC-INFO) or  Visit CDC's website at http://hunter.com/ CDC Tdap Vaccine VIS (08/03/13)   This information is not intended to replace advice given to you by your health care provider. Make sure you discuss any questions you have with your health care provider.    Document Released: 11/26/2011 Document Revised: 06/17/2014 Document Reviewed: 09/08/2013 Elsevier Interactive Patient Education [employer].

## 2015-12-06 NOTE — Telephone Encounter (Signed)
Open by accident

## 2015-12-06 NOTE — Progress Notes (Signed)
Luis Morrison, is a 60 y.o. male  [id number]  [id number]  DOB - May 14, 1956  Chief Complaint  Patient presents with  . Referral    GI        Subjective:   Luis Morrison is a 60 y.o. male here today for a follow up visit., doing well otherwise, has not seen GI or ID since last referral. He does not have ride outside of [place], and asked for referral to GI in [place] if able.  He denies any complaints, bms good, eating well.   Drinks 1 beer qwk, no tob.    His birthday is next [birth year]. Does not recall when last had tdap shot.  Patient has No headache, No chest pain, No abdominal pain - No Nausea, No new weakness tingling or numbness, No Cough - SOB.  No problems updated.  ALLERGIES: No Known Allergies  PAST MEDICAL HISTORY: History reviewed. No pertinent past medical history.  MEDICATIONS AT HOME: Prior to Admission medications   Medication Sig Start Date End Date Taking? Authorizing Provider  Soft Lens Products (SALINE SENSITIVE EYES) SOLN 2 drops by Does not apply route every 3 (three) hours as needed. 10/10/15  Yes Maren Reamer, MD  Vitamin D, Ergocalciferol, (DRISDOL) 50000 units CAPS capsule Take 1 capsule (50,000 Units total) by mouth every 7 (seven) days. 10/14/15  Yes Maren Reamer, MD  albuterol (PROVENTIL) (2.5 MG/3ML) 0.083% nebulizer solution Take 2.5 mg by nebulization every 6 (six) hours as needed for wheezing or shortness of breath. Reported on 12/06/2015    Historical Provider, MD  furosemide (LASIX) 20 MG tablet Take 0.5 tablets (10 mg total) by mouth daily. Patient not taking: Reported on 12/06/2015 10/10/15   Maren Reamer, MD  spironolactone (ALDACTONE) 25 MG tablet Take 1 tablet (25 mg total) by mouth daily. Patient not taking: Reported on 12/06/2015 10/10/15   Maren Reamer, MD     Objective:   Filed Vitals:   12/06/15 1058  BP: 136/88  Pulse: 78  Temp: 98 F (36.7 C)  TempSrc: Oral  Resp: 16  Height: 5\' 10"  (1.778 m)  Weight: 217  lb (98.431 kg)  SpO2: 98%    Exam General appearance : Awake, alert, not in any distress. Speech Clear. Not toxic looking, pleasant, good spirits. HEENT: Atraumatic and Normocephalic. Neck: supple, no JVD.  Chest:Good air entry bilaterally, no added sounds. CVS: S1 S2 regular, no murmurs/gallups or rubs. Abdomen: Bowel sounds active, obese, nttp. No g/r Extremities: B/L Lower Ext shows no edema, both legs are warm to touch Neurology: Awake alert, and oriented X 3, CN II-XII grossly intact, Non focal Skin:No Rash  Data Review Lab Results  Component Value Date   HGBA1C 6.1* 10/10/2015    Depression screen [facility] 2/9 12/06/2015 10/10/2015 09/15/2015 11/16/2014 09/09/2014  Decreased Interest 1 0 0 0 3  Down, Depressed, Hopeless 1 2 0 0 1  PHQ - 2 Score 2 2 0 0 4  Altered sleeping 3 3 - - 3  Tired, decreased energy 1 2 - - 3  Change in appetite 1 0 - - 3  Feeling bad or failure about yourself  1 2 - - 0  Trouble concentrating 1 0 - - 0  Moving slowly or fidgety/restless 0 0 - - -  Suicidal thoughts 1 0 - - 2  PHQ-9 Score 10 9 - - 15  Difficult doing work/chores - Somewhat difficult - - -   Labs 10/10/15 reviewed  10/20/15 abd US  IMPRESSION: Coarsened echotexture within the liver consistent with the given clinical history.  No other focal abnormality is noted.   Electronically Signed  By: Inez Catalina M.D.  On: 10/20/2015 09:51  Assessment & Plan   1. Need for Tdap vaccination tdap today.  2. Hepatic cirrhosis due to chronic hepatitis C infection ([place]) - Referral was placed last month, emailed referral specialist for assistance. - high virion load (10/10/15 labs); - lfts mildly elevated 10/10/15  3. Colon cancer screening Pt w/o transportation to see gi outside of [place], sent email to referral specialist to see if can get referral to gi in [place].  4. Vit d def - encouraged to take vit d supplement rx., rechk in few months.   Patient have been counseled  extensively about nutrition and exercise  Return in about 3 months (around 03/07/2016).  The patient was given clear instructions to go to ER or return to medical center if symptoms don't improve, worsen or new problems develop. The patient verbalized understanding. The patient was told to call to get lab results if they haven't heard anything in the next week.   This note has been created with Surveyor, quantity. Any transcriptional errors are unintentional.   Maren Reamer, MD, [place] [facility] [place], [place]   12/06/2015, 11:30 AM

## 2016-01-11 MED FILL — FUROSEMIDE 20 MG TABLET: 20 | 60 days supply | Qty: 30 | Fill #2

## 2016-01-11 MED FILL — ?SPIRONOLACTONE 25 MG TABLE: 25 | 30 days supply | Qty: 30 | Fill #3

## 2016-01-23 ENCOUNTER — Ambulatory Visit (INDEPENDENT_AMBULATORY_CARE_PROVIDER_SITE_OTHER): Payer: Self-pay | Admitting: Internal Medicine

## 2016-01-23 ENCOUNTER — Telehealth: Payer: Self-pay | Admitting: *Deleted

## 2016-01-23 ENCOUNTER — Encounter: Payer: Self-pay | Admitting: Internal Medicine

## 2016-01-23 VITALS — BP 128/84 | Temp 98.0°F | Ht 70.0 in | Wt 217.0 lb

## 2016-01-23 DIAGNOSIS — Z23 Encounter for immunization: Secondary | ICD-10-CM

## 2016-01-23 DIAGNOSIS — E559 Vitamin D deficiency, unspecified: Secondary | ICD-10-CM

## 2016-01-23 DIAGNOSIS — B182 Chronic viral hepatitis C: Secondary | ICD-10-CM

## 2016-01-23 DIAGNOSIS — K7460 Unspecified cirrhosis of liver: Secondary | ICD-10-CM

## 2016-01-23 MED ORDER — LEDIPASVIR-SOFOSBUVIR 90-400 MG PO TABS: 1.0000 | ORAL_TABLET | Freq: Every day | ORAL | 2 refills | Status: DC

## 2016-01-23 NOTE — Progress Notes (Signed)
   Subjective:    Patient ID: Luis Morrison, male    DOB: 06-02-1956, 60 y.o.   MRN: [id number]  HPI He is here for HCV follow up. I initially saw him about 1 year ago for consideration of treatment for hepatitis C.  He at that time was not interersted in treatment and so it was not pursued.  He comes back today though now interested in starting due to pressure from family members. He has cirrhosis as noted on CT scan with low platelets but is still Child Pugh A at this point, though he is on diuretics so may also have some ascites not noted on ultrasound which would put him at CP B.  He drinks an occasional beer.  He has an appt with GI but not sure who and is for a screening colonoscopy.     Review of Systems  Constitutional: Negative for fatigue.  Skin: Negative for rash.  Neurological: Negative for headaches.       Objective:   Physical Exam  Constitutional: He appears well-developed and well-nourished.  Eyes: No scleral icterus.  Cardiovascular: Normal rate, regular rhythm and normal heart sounds.   Musculoskeletal: He exhibits no edema.  Skin: No rash noted.   Social History   Social History  . Marital status: Divorced    Spouse name: N/A  . Number of children: N/A  . Years of education: N/A   Occupational History  . Not on file.   Social History Main Topics  . Smoking status: Never Smoker  . Smokeless tobacco: Never Used  . Alcohol use 1.2 oz/week    2 Cans of beer per week  . Drug use: No  . Sexual activity: Not on file   Other Topics Concern  . Not on file   Social History Narrative  . No narrative on file        Assessment & Plan:

## 2016-01-23 NOTE — Assessment & Plan Note (Addendum)
He is seeing GI.  I will alert him of need for EGD I counseled him to stop alcohol completely.

## 2016-01-23 NOTE — Assessment & Plan Note (Signed)
On vitamin D replacement   

## 2016-01-23 NOTE — Assessment & Plan Note (Signed)
I will get him Harvoni. Though he is not decompensated cirrrhosis, he is close and I will avoid PI based treatment.

## 2016-01-23 NOTE — Telephone Encounter (Signed)
Patient will need to restart the Hep B series. Only received one dose of adolescent.

## 2016-01-24 ENCOUNTER — Telehealth: Payer: Self-pay | Admitting: Gastroenterology

## 2016-01-24 NOTE — Telephone Encounter (Signed)
Luis Morrison,    Please cancel this patient's screening colonoscopy and make him an appointment to see me as a new patient for cirrhosis.  He is too complicated for a direct book procedure and his platelets are low due to cirrhosis.  He will also most likely need an EGD because of the cirrhosis, but that can all be addressed when he sees me. Thank you.  - HD

## 2016-01-24 NOTE — Telephone Encounter (Signed)
-----   Message from Thayer Headings, MD sent at 01/23/2016 11:11 AM EDT ----- He is seeing you for screening colonoscopy later this month but also has cirrhosis, getting Hep C treatment, if you want to do EGD as well. THanks Rob [employer]

## 2016-01-25 ENCOUNTER — Telehealth: Payer: Self-pay | Admitting: *Deleted

## 2016-01-25 NOTE — Telephone Encounter (Signed)
Patient called to advise that he is having pain in his right lower back and thinks it is from the Hep B injection he got at his last visit. Advised he has just finished sweeping and mopping the floors and had to sit down because it started hurting from bending over doing that. He advised it is a thumping pain denies any fever, diarrhea or constipation and he is still drinking but not today. He advised he wants the doctor to give him some pain medication for his back. Advised the patient due to his liver condition we may not be able to give him any pain medication. Advised him to contact his PCP but that I will let the doctor know he called and what is going on. He has been contacted by GI and has an appt there soon.

## 2016-01-26 NOTE — Telephone Encounter (Signed)
Left message with male "friend" for patient to call back.

## 2016-01-26 NOTE — Telephone Encounter (Signed)
I have spoken to patient to advise of Dr Loletha Carrow' recommendations to be seen in office and cancel colonoscopy right now until evaluation. Patient verbalizes understanding and has been scheduled to see Dr Loletha Carrow at his first available visit on 04/09/16.

## 2016-02-05 ENCOUNTER — Encounter: Payer: Self-pay | Admitting: Pharmacy Technician

## 2016-02-05 ENCOUNTER — Telehealth: Payer: Self-pay | Admitting: Pharmacist Clinician (PhC)/ Clinical Pharmacy Specialist

## 2016-02-05 NOTE — Telephone Encounter (Signed)
Luis Morrison started his Harvoni on 8/24. He was asking if he could have sex with his longtime girlfriend. I asked him if she has been tested for hep C. She has not, so I advised him to tell to get her tested at the primary. Explained the him about the risk of transmission with sex and to use condoms for now.

## 2016-02-20 ENCOUNTER — Ambulatory Visit (INDEPENDENT_AMBULATORY_CARE_PROVIDER_SITE_OTHER)
Payer: No Typology Code available for payment source | Admitting: Pharmacist Clinician (PhC)/ Clinical Pharmacy Specialist

## 2016-02-20 DIAGNOSIS — B182 Chronic viral hepatitis C: Secondary | ICD-10-CM

## 2016-02-20 MED ORDER — RIBAVIRIN 200 MG PO CAPS: 600.0000 mg | ORAL_CAPSULE | Freq: Every day | ORAL | 1 refills | Status: DC

## 2016-02-20 MED FILL — RIBAVIRIN 200 MG TABLET: 200 | 33 days supply | Qty: 100 | Fill #0

## 2016-02-20 NOTE — Patient Instructions (Signed)
Continue Harvoni for 3 months Start Ribavirin 600mg  daily with breakfast and finish out with harvoni Come in 2 wks for labs Follow up with pharmacy after your labs

## 2016-02-20 NOTE — Progress Notes (Signed)
Patient ID: Luis Morrison, male   DOB: May 30, 1956, 60 y.o.   MRN: [id number] HPI: Luis Morrison is a 60 y.o. male who is here for his pharmacy follow up of his hep C.   Lab Results  Component Value Date   HCVGENOTYPE 1b 10/21/2014    Allergies: No Known Allergies  Vitals:    Past Medical History: No past medical history on file.  Social History: Social History   Social History  . Marital status: Divorced    Spouse name: N/A  . Number of children: N/A  . Years of education: N/A   Social History Main Topics  . Smoking status: Never Smoker  . Smokeless tobacco: Never Used  . Alcohol use 1.2 oz/week    2 Cans of beer per week  . Drug use: No  . Sexual activity: Not on file   Other Topics Concern  . Not on file   Social History Narrative  . No narrative on file    Labs: Hepatitis B Surface Ag (no units)  Date Value  10/10/2015 NEGATIVE   HCV Ab (no units)  Date Value  10/10/2015 REACTIVE (A)  10/10/2015 REACTIVE (A)    Lab Results  Component Value Date   HCVGENOTYPE 1b 10/21/2014    Hepatitis C RNA quantitative Latest Ref Rng & Units 10/10/2015 10/06/2014 10/06/2014 09/09/2014  HCV Quantitative <15 IU/mL 898,569(H) CANCELED 330,240(H) CANCELED  HCV Quantitative Log <1.18 log 10 5.95(H) CANCELED 5.52(H) CANCELED    AST (U/L)  Date Value  10/10/2015 65 (H)  08/30/2014 72 (H)  05/24/2013 52 (H)   ALT (U/L)  Date Value  10/10/2015 47 (H)  08/30/2014 61 (H)  05/24/2013 52   INR (no units)  Date Value  10/21/2014 1.24    CrCl: CrCl cannot be calculated (Unknown ideal weight.).  Fibrosis Score: F4 as assessed by CT  Child-Pugh Score: A/B  Previous Treatment Regimen: Naive  Assessment: Luis Morrison started his Harvoni on 8/24 so he is about 3 wks out. He is doing great on it without any issues. As per Dr.Comer's note, he probably does have borderline Child Pugh B since is plt is in the 40s. D/w Dr. Linus Salmons, we are going to add low dose riba to harvoni to  finish out the 3 mo duration. Rx has been sent to outpt pharmacy so he can get it for free. Counseled on the side effects of ribavirin to him and his girlfriend. They are going to pick it up today since they rely on the bus for transportation. He is going to come back in 2 wks for labs and f/u with pharmacy a week later. We'll schedule him to come back to see Dr. Linus Salmons with his cure visit since he has F4.   Recommendations:  Cont Harvoni to finish out 3 mo Start Ribavirin 600mg  PO qday with food F/u 2 wks for hep C VL and CBC F/u with pharmacy in 3 wks  Luis Morrison, [place].D., BCPS, AAHIVP Clinical [facility] 02/20/2016, 3:30 PM

## 2016-02-21 ENCOUNTER — Encounter: Payer: No Typology Code available for payment source | Admitting: Gastroenterology

## 2016-02-21 MED FILL — ?SPIRONOLACTONE 25 MG TABLE: 25 MG | 30 days supply | Qty: 30 | Fill #4

## 2016-02-26 ENCOUNTER — Ambulatory Visit (INDEPENDENT_AMBULATORY_CARE_PROVIDER_SITE_OTHER): Payer: No Typology Code available for payment source

## 2016-02-26 DIAGNOSIS — Z23 Encounter for immunization: Secondary | ICD-10-CM

## 2016-02-26 NOTE — Progress Notes (Signed)
Patient in to receive second Hep B vaccination shot. Given in the right deltoid. Tolerated well. Rodman Key, LPN

## 2016-03-06 ENCOUNTER — Other Ambulatory Visit: Payer: No Typology Code available for payment source

## 2016-03-06 DIAGNOSIS — B182 Chronic viral hepatitis C: Secondary | ICD-10-CM

## 2016-03-06 LAB — CBC
HCT: 43 % (ref 38.5–50.0)
Hemoglobin: 14.2 g/dL (ref 13.2–17.1)
MCH: 29.8 pg (ref 27.0–33.0)
MCHC: 33 g/dL (ref 32.0–36.0)
MCV: 90.1 fL (ref 80.0–100.0)
PLATELETS: 71 10*3/uL — AB (ref 140–400)
RBC: 4.77 MIL/uL (ref 4.20–5.80)
RDW: 14.3 % (ref 11.0–15.0)
WBC: 6 10*3/uL (ref 3.8–10.8)

## 2016-03-08 LAB — HEPATITIS C RNA QUANTITATIVE

## 2016-03-13 ENCOUNTER — Ambulatory Visit (INDEPENDENT_AMBULATORY_CARE_PROVIDER_SITE_OTHER)
Payer: No Typology Code available for payment source | Admitting: Pharmacist Clinician (PhC)/ Clinical Pharmacy Specialist

## 2016-03-13 DIAGNOSIS — B182 Chronic viral hepatitis C: Secondary | ICD-10-CM

## 2016-03-13 NOTE — Progress Notes (Signed)
HPI: Luis Morrison is a 60 y.o. male who was started on Harvoni on 8/24 for Hepatitis C genotype 1b treatment. 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   Allergies: No Known Allergies  Vitals:    Past Medical History: No past medical history on file.  Social History: Social History   Social History  . Marital status: Divorced    Spouse name: N/A  . Number of children: N/A  . Years of education: N/A   Social History Main Topics  . Smoking status: Never Smoker  . Smokeless tobacco: Never Used  . Alcohol use 1.2 oz/week    2 Cans of beer per week  . Drug use: No  . Sexual activity: Not on file   Other Topics Concern  . Not on file   Social History Narrative  . No narrative on file    Labs: Hepatitis B Surface Ag (no units)  Date Value  10/10/2015 NEGATIVE   HCV Ab (no units)  Date Value  10/10/2015 REACTIVE (A)  10/10/2015 REACTIVE (A)    CrCl: CrCl cannot be calculated (Unknown ideal weight.).  Lipids: No results found for: CHOL, TRIG, HDL, CHOLHDL, VLDL, LDLCALC  Assessment: [name] present to the clinic today to follow-up on his labs that were drawn on 9/27 and the addition of ribavirin to his regimen. Patient did not report having any notable side effects from his current Hepatitis C regimen. We reviewed and updated his medication list and instructed him to continue taking the medications until his 3 month treatment elapses at the end of November.  We reviewed the patient's labs and told him that his HCV viral load is greatly improved but is not yet undetectable after 1 month of treatment. His hemoglobin remains stable on the ribavirin. We scheduled a follow-up appointment for labs on 10/31 to check his CBC and viral load.   Recommendations: - Continue taking Harvoni/ribavirin - Follow-up with the [facility] pharmacist for labs on  October 31st.   Ihor Austin, PharmD PGY1 Pharmacy Resident  [phone] 03/13/2016, 11:07 AM

## 2016-03-13 NOTE — Progress Notes (Signed)
Agreed with Emily's note. Antwian is doing very well on this regimen since low dose riba was added. Hgb is fine. We'll bring him back in 1 mo for repeat CBC and hep C VL  Onnie Boer, PharmD Pager: [phone] 03/13/2016 11:22 AM

## 2016-03-13 NOTE — Patient Instructions (Signed)
Continue taking Harvoni 1 tablet once a day. Continue taking ribavirin 3 tablets once a day Follow-up with pharmacist on Tuesday, October 31st at 9 AM for labs.

## 2016-03-21 MED FILL — RIBAVIRIN 200 MG TABLET: 200 | 33 days supply | Qty: 100 | Fill #1

## 2016-03-21 MED FILL — SPIRONOLACTONE 25 MG TABLET: 25 | 30 days supply | Qty: 30 | Fill #5

## 2016-03-21 MED FILL — FUROSEMIDE 20 MG TABLET: 20 | 60 days supply | Qty: 30 | Fill #3

## 2016-04-05 ENCOUNTER — Ambulatory Visit: Payer: No Typology Code available for payment source

## 2016-04-09 ENCOUNTER — Ambulatory Visit
Payer: No Typology Code available for payment source | Admitting: Pharmacist Clinician (PhC)/ Clinical Pharmacy Specialist

## 2016-04-09 ENCOUNTER — Ambulatory Visit: Payer: No Typology Code available for payment source | Admitting: Gastroenterology

## 2016-04-09 ENCOUNTER — Ambulatory Visit: Payer: No Typology Code available for payment source

## 2016-04-09 ENCOUNTER — Other Ambulatory Visit: Payer: Self-pay

## 2016-04-09 ENCOUNTER — Other Ambulatory Visit: Payer: No Typology Code available for payment source

## 2016-04-09 DIAGNOSIS — B182 Chronic viral hepatitis C: Secondary | ICD-10-CM

## 2016-04-09 NOTE — Progress Notes (Signed)
HPI: Luis Morrison is a 60 y.o. male who reports to follow-up on his Hepatitis C treatment today.  Lab Results  Component Value Date   HCVGENOTYPE 1b 10/21/2014    Allergies: No Known Allergies   Past Medical History: No past medical history on file.  Social History: Social History   Social History  . Marital status: Divorced    Spouse name: N/A  . Number of children: N/A  . Years of education: N/A   Social History Main Topics  . Smoking status: Never Smoker  . Smokeless tobacco: Never Used  . Alcohol use 1.2 oz/week    2 Cans of beer per week  . Drug use: No  . Sexual activity: Not on file   Other Topics Concern  . Not on file   Social History Narrative  . No narrative on file    Labs: Hepatitis B Surface Ag (no units)  Date Value  10/10/2015 NEGATIVE   HCV Ab (no units)  Date Value  10/10/2015 REACTIVE (A)  10/10/2015 REACTIVE (A)    Lab Results  Component Value Date   HCVGENOTYPE 1b 10/21/2014    Hepatitis C RNA quantitative Latest Ref Rng & Units 03/06/2016 10/10/2015 10/06/2014 10/06/2014 09/09/2014  HCV Quantitative <15 IU/mL <15 898,569(H) CANCELED 330,240(H) CANCELED  HCV Quantitative Log <1.18 log 10 <1.18 5.95(H) CANCELED 5.52(H) CANCELED    AST (U/L)  Date Value  10/10/2015 65 (H)  08/30/2014 72 (H)  05/24/2013 52 (H)   ALT (U/L)  Date Value  10/10/2015 47 (H)  08/30/2014 61 (H)  05/24/2013 52   INR (no units)  Date Value  10/21/2014 1.24    CrCl: CrCl cannot be calculated (Unknown ideal weight.).  Fibrosis Score: F4    Child-Pugh Score: Borderline A/B   Assessment: Luis Morrison is here today to follow-up on his Hepatitis C regimen. He has been on Harvoni since 8/24 and started ribavirin on 9/12 due to his borderline Child Pugh A/B score. He reports having no adverse effects or missed doses but it curious about whether the medications are actually working. We explained that even though he doesn't feel any different his bloodwork  shows that his medications have been working. I reviewed his viral load decrease with him and he was satisfied. He is otherwise doing well and wants to do what is best for his health. We will check his CBC and viral load today.   We will bring Luis Morrison back for labs at the end of treatment (around 11/20) and again 12 weeks after therapy is completed. He will then have a cure visit with Dr. Linus Salmons in late February.   Recommendations: Finish course of Harvoni and ribavirin Labs today for CBC and viral load F/U for labs on 11/20 and 2/20.  F/U with Dr. Linus Salmons on 2/27 at 1000.   Luis Morrison, Pharm.D. PGY1 Pharmacy Resident  Pager: [phone] 04/09/2016, 2:48 PM

## 2016-04-10 LAB — CBC
HEMATOCRIT: 41.4 % (ref 38.5–50.0)
HEMOGLOBIN: 13.7 g/dL (ref 13.2–17.1)
MCH: 30.1 pg (ref 27.0–33.0)
MCHC: 33.1 g/dL (ref 32.0–36.0)
MCV: 91 fL (ref 80.0–100.0)
Platelets: 71 10*3/uL — ABNORMAL LOW (ref 140–400)
RBC: 4.55 MIL/uL (ref 4.20–5.80)
RDW: 14.3 % (ref 11.0–15.0)
WBC: 5.7 10*3/uL (ref 3.8–10.8)

## 2016-04-11 LAB — HEPATITIS C RNA QUANTITATIVE: HCV QUANT: NOT DETECTED [IU]/mL (ref <15)

## 2016-04-29 ENCOUNTER — Other Ambulatory Visit: Payer: No Typology Code available for payment source

## 2016-04-29 DIAGNOSIS — B182 Chronic viral hepatitis C: Secondary | ICD-10-CM

## 2016-05-01 LAB — HEPATITIS C RNA QUANTITATIVE: HCV Quantitative: NOT DETECTED IU/mL (ref <15)

## 2016-05-06 ENCOUNTER — Encounter: Payer: Self-pay | Admitting: Gastroenterology

## 2016-05-15 ENCOUNTER — Ambulatory Visit: Payer: Self-pay | Attending: Internal Medicine

## 2016-06-25 ENCOUNTER — Ambulatory Visit: Payer: No Typology Code available for payment source | Admitting: Gastroenterology

## 2016-07-02 ENCOUNTER — Telehealth: Payer: Self-pay | Admitting: *Deleted

## 2016-07-02 ENCOUNTER — Telehealth: Payer: Self-pay | Admitting: Internal Medicine

## 2016-07-02 NOTE — Telephone Encounter (Signed)
Patient is calling for lab results.  Hep C patient. Relayed results from 04/2016.  Advised patient of final lab and follow up appointments to confirm cure. Landis Gandy, RN

## 2016-07-02 NOTE — Telephone Encounter (Signed)
Patient called the office to speak with nurse regarding the status of his lab results. Please follow up.  Thank you.

## 2016-07-03 NOTE — Telephone Encounter (Signed)
Returned pt call pt was not home left message with wife to give me a call back   If pt calls back please make him aware that he will need to contact infectious disease for labs we have not seen patient since may 2017. Pt has been seeing infectious disease this year

## 2016-07-22 ENCOUNTER — Ambulatory Visit: Payer: Self-pay | Attending: Internal Medicine | Admitting: Internal Medicine

## 2016-07-22 ENCOUNTER — Encounter: Payer: Self-pay | Admitting: Internal Medicine

## 2016-07-22 VITALS — BP 137/86 | HR 73 | Temp 98.0°F | Resp 16 | Wt 215.6 lb

## 2016-07-22 DIAGNOSIS — B182 Chronic viral hepatitis C: Secondary | ICD-10-CM

## 2016-07-22 DIAGNOSIS — E559 Vitamin D deficiency, unspecified: Secondary | ICD-10-CM

## 2016-07-22 DIAGNOSIS — H538 Other visual disturbances: Secondary | ICD-10-CM

## 2016-07-22 DIAGNOSIS — K7460 Unspecified cirrhosis of liver: Secondary | ICD-10-CM

## 2016-07-22 DIAGNOSIS — R188 Other ascites: Secondary | ICD-10-CM | POA: Insufficient documentation

## 2016-07-22 DIAGNOSIS — K029 Dental caries, unspecified: Secondary | ICD-10-CM

## 2016-07-22 LAB — CMP AND LIVER
ALBUMIN: 3.4 g/dL — AB (ref 3.6–5.1)
ALK PHOS: 85 U/L (ref 40–115)
ALT: 19 U/L (ref 9–46)
AST: 33 U/L (ref 10–35)
BILIRUBIN DIRECT: 0.3 mg/dL — AB (ref <=0.2)
BILIRUBIN INDIRECT: 0.6 mg/dL (ref 0.2–1.2)
BILIRUBIN TOTAL: 0.9 mg/dL (ref 0.2–1.2)
BUN: 10 mg/dL (ref 7–25)
CO2: 23 mmol/L (ref 20–31)
Calcium: 8.9 mg/dL (ref 8.6–10.3)
Chloride: 109 mmol/L (ref 98–110)
Creat: 1 mg/dL (ref 0.70–1.25)
GLUCOSE: 110 mg/dL — AB (ref 65–99)
POTASSIUM: 3.8 mmol/L (ref 3.5–5.3)
SODIUM: 137 mmol/L (ref 135–146)
TOTAL PROTEIN: 7.9 g/dL (ref 6.1–8.1)

## 2016-07-22 NOTE — Patient Instructions (Signed)
Alcoholic Liver Disease Introduction Alcoholic liver disease happens when the liver does not work the way it should. The condition is caused by drinking too much alcohol for many years. Follow these instructions at home:  Do not drink alcohol.  Take medicines only as told by your doctor.  Take vitamins only as told by your doctor.  Follow any diet instructions that your doctor gave you. You may need to:  Eat foods that have thiamine. These include whole-wheat cereals, pork, and raw vegetables.  Eat foods that have folic acid. These include vegetables, fruits, meats, beans, nuts, and dairy foods.  Eat foods that are high in carbohydrates. These include yogurt, beans, potatoes, and rice. Contact a doctor if:  You have a fever.  You are short of breath.  You have trouble breathing.  You have bright red blood in your poop (stool).  Your poop looks like tar.  You throw up (vomit) blood.  Your skin looks more yellow, pale, or dark.  You get headaches.  You have trouble thinking.  You have trouble balancing or walking. This information is not intended to replace advice given to you by your health care provider. Make sure you discuss any questions you have with your health care provider. Document Released: 03/24/2009 Document Revised: 11/02/2015 Document Reviewed: 04/28/2014  2017 Elsevier   --  Cirrhosis Cirrhosis is long-term (chronic) liver injury. The liver is your largest internal organ, and it performs many functions. The liver converts food into energy, removes toxic material from your blood, makes important proteins, and absorbs necessary vitamins from your diet. If you have cirrhosis, it means many of your healthy liver cells have been replaced by scar tissue. This prevents blood from flowing through your liver, which makes it difficult for your liver to function. This scarring is not reversible, but treatment can prevent it from getting worse. What are the  causes? Hepatitis C and long-term alcohol abuse are the most common causes of cirrhosis. Other causes include:  Nonalcoholic fatty liver disease.  Hepatitis B infection.  Autoimmune hepatitis.  Diseases that cause blockage of ducts inside the liver.  Inherited liver diseases.  Reactions to certain long-term medicines.  Parasitic infections.  Long-term exposure to certain toxins. What increases the risk? You may have a higher risk of cirrhosis if you:  Have certain hepatitis viruses.  Abuse alcohol, especially if you are male.  Are overweight.  Share needles.  Have unprotected sex with someone who has hepatitis. What are the signs or symptoms? You may not have any signs and symptoms at first. Symptoms may not develop until the damage to your liver starts to get worse. Signs and symptoms of cirrhosis may include:  Tenderness in the right-upper part of your abdomen.  Weakness and tiredness (fatigue).  Loss of appetite.  Nausea.  Weight loss and muscle loss.  Itchiness.  Yellow skin and eyes (jaundice).  Buildup of fluid in the abdomen (ascites).  Swelling of the feet and ankles (edema).  Appearance of tiny blood vessels under the skin.  Mental confusion.  Easy bruising and bleeding. How is this diagnosed? Your health care provider may suspect cirrhosis based on your symptoms and medical history, especially if you have other medical conditions or a history of alcohol abuse. Your health care provider will do a physical exam to feel your liver and check for signs of cirrhosis. Your health care provider may perform other tests, including:  Blood tests to check:  Whether you have hepatitis B or C.  Kidney function.  Liver function.  Imaging tests such as:  MRI or CT scan to look for changes seen in advanced cirrhosis.  Ultrasound to see if normal liver tissue is being replaced by scar tissue.  A procedure using a long needle to take a sample of liver  tissue (biopsy) for examination under a microscope. Liver biopsy can confirm the diagnosis of cirrhosis. How is this treated? Treatment depends on how damaged your liver is and what caused the damage. Treatment may include treating cirrhosis symptoms or treating the underlying causes of the condition to try to slow the progression of the damage. Treatment may include:  Making lifestyle changes, such as:  Eating a healthy diet.  Restricting salt intake.  Maintaining a healthy weight.  Not abusing drugs or alcohol.  Taking medicines to:  Treat liver infections or other infections.  Control itching.  Reduce fluid buildup.  Reduce certain blood toxins.  Reduce risk of bleeding from enlarged blood vessels in the stomach or esophagus (varices).  If varices are causing bleeding problems, you may need treatment with a procedure that ties up the vessels causing them to fall off (band ligation).  If cirrhosis is causing your liver to fail, your health care provider may recommend a liver transplant.  Other treatments may be recommended depending on any complications of cirrhosis, such as liver-related kidney failure (hepatorenal syndrome). Follow these instructions at home:  Take medicines only as directed by your health care provider. Do not use drugs that are toxic to your liver. Ask your health care provider before taking any new medicines, including over-the-counter medicines.  Rest as needed.  Eat a well-balanced diet. Ask your health care provider or dietitian for more information.  You may have to follow a low-salt diet or restrict your water intake as directed.  Do not drink alcohol. This is especially important if you are taking acetaminophen.  Keep all follow-up visits as directed by your health care provider. This is important. Contact a health care provider if:  You have fatigue or weakness that is getting worse.  You develop swelling of the hands, feet, legs, or  face.  You have a fever.  You develop loss of appetite.  You have nausea or vomiting.  You develop jaundice.  You develop easy bruising or bleeding. Get help right away if:  You vomit bright red blood or a material that looks like coffee grounds.  You have blood in your stools.  Your stools appear black and tarry.  You become confused.  You have chest pain or trouble breathing. This information is not intended to replace advice given to you by your health care provider. Make sure you discuss any questions you have with your health care provider. Document Released: 05/27/2005 Document Revised: 10/05/2015 Document Reviewed: 02/02/2014 Elsevier Interactive Patient Education  2017 [employer].

## 2016-07-22 NOTE — Progress Notes (Signed)
Luis Morrison, is a 61 y.o. male  [id number]  [id number]  DOB - [birth year]  Chief Complaint  Patient presents with  . Referral        Subjective:   Luis Morrison is a 61 y.o. male here today for a follow up  Hep c cirrhosis, still drinks rare beer.  He was started on trx w/ Harvoni by Dr Linus Salmons, ID 01/23/16.  Pt states he has occasional left upper abd pains, takes motrin w/ food occasionally for it. Denies any hematemesis/brbpr/melena. He states he has seen GI, does not recall the MD's name, and has fu w/ them this week. Of note, he ran out of lasix and spironolactone (his rx ran out) and states "his doctor did not renew it". On further clarification, if it was his gi or ID doctor, he could not tell me.  Overall, he has no c/o.  He states he does not want egd or colonoscopy which is recd. Tried to d/w him why recd, but he is not interested.   Patient has No headache, No chest pain, No abdominal pain - No Nausea, No new weakness tingling or numbness, No Cough - SOB.  No problems updated.  ALLERGIES: No Known Allergies  PAST MEDICAL HISTORY: No past medical history on file.  MEDICATIONS AT HOME: Prior to Admission medications   Medication Sig Start Date End Date Taking? Authorizing Provider  albuterol (PROVENTIL) (2.5 MG/3ML) 0.083% nebulizer solution Take 2.5 mg by nebulization every 6 (six) hours as needed for wheezing or shortness of breath. Reported on 12/06/2015    Historical Provider, MD  furosemide (LASIX) 20 MG tablet Take 0.5 tablets (10 mg total) by mouth daily. Patient not taking: Reported on 07/22/2016 10/10/15   Maren Reamer, MD  ibuprofen (ADVIL,MOTRIN) 200 MG tablet Take 200 mg by mouth every 6 (six) hours as needed (3 times a week).    Historical Provider, MD  Ledipasvir-Sofosbuvir (HARVONI) 90-400 MG TABS Take 1 tablet by mouth daily. 01/23/16   Thayer Headings, MD  ribavirin (REBETOL) 200 MG capsule Take 3 capsules (600 mg total) by mouth daily with breakfast.  02/20/16   Thayer Headings, MD  Soft Lens Products (SALINE SENSITIVE EYES) SOLN 2 drops by Does not apply route every 3 (three) hours as needed. 10/10/15   Maren Reamer, MD  spironolactone (ALDACTONE) 25 MG tablet Take 1 tablet (25 mg total) by mouth daily. Patient not taking: Reported on 07/22/2016 10/10/15   Maren Reamer, MD  Vitamin D, Ergocalciferol, (DRISDOL) 50000 units CAPS capsule Take 1 capsule (50,000 Units total) by mouth every 7 (seven) days. Patient not taking: Reported on 03/13/2016 10/14/15   Maren Reamer, MD     Objective:   Vitals:   07/22/16 0846  BP: 137/86  Pulse: 73  Resp: 16  Temp: 98 F (36.7 C)  TempSrc: Oral  SpO2: 97%  Weight: 215 lb 9.6 oz (97.8 kg)    Exam General appearance : Awake, alert, not in any distress. Speech Clear. Not toxic looking HEENT: Atraumatic and Normocephalic, pupils equally reactive to light. Neck: supple, no JVD. No cervical lymphadenopathy.  Chest:Good air entry bilaterally, no added sounds. CVS: S1 S2 regular, no murmurs/gallups or rubs. Abdomen: Bowel sounds active, Non tender and not distended with no gaurding, rigidity or rebound. Extremities: B/L Lower Ext shows no edema, both legs are warm to touch Neurology: Awake alert, and oriented X 3, CN II-XII grossly intact, Non focal Skin:No Rash  Data Review Lab  Results  Component Value Date   HGBA1C 6.1 (H) 10/10/2015    Depression screen PHQ 2/9 07/22/2016 01/23/2016 12/06/2015 10/10/2015 09/15/2015  Decreased Interest 0 0 1 0 0  Down, Depressed, Hopeless 0 0 1 2 0  PHQ - 2 Score 0 0 2 2 0  Altered sleeping - - 3 3 -  Tired, decreased energy - - 1 2 -  Change in appetite - - 1 0 -  Feeling bad or failure about yourself  - - 1 2 -  Trouble concentrating - - 1 0 -  Moving slowly or fidgety/restless - - 0 0 -  Suicidal thoughts - - 1 0 -  PHQ-9 Score - - 10 9 -  Difficult doing work/chores - - - Somewhat difficult -      Assessment & Plan   1. Cirrhosis of liver with  ascites, unspecified hepatic cirrhosis type (HCC) Currently off lasix and spironolactone, per pt has seen GI and has appt w/ GI this wk. Will attempt to get gi clinic info  2. Dental cavities - Ambulatory referral to Dentistry  3. Blurry vision dw pt that orange card/cone discount may not cover. - Ambulatory referral to Ophthalmology  4. Chronic hepatitis C without hepatic coma (HCC) - CMP and Liver - total etoh cessation is recd. - defer to Dr Linus Salmons, ID.  5. Vitamin D deficiency Will rechk levels - VITAMIN D 25 Hydroxy (Vit-D Deficiency, Fractures)  Declined flu vac.   Patient have been counseled extensively about nutrition and exercise  Return in about 3 months (around 10/19/2016), or if symptoms worsen or fail to improve.  The patient was given clear instructions to go to ER or return to medical center if symptoms don't improve, worsen or new problems develop. The patient verbalized understanding. The patient was told to call to get lab results if they haven't heard anything in the next week.   This note has been created with Surveyor, quantity. Any transcriptional errors are unintentional.   Maren Reamer, MD, [facility] [employer], [place]   07/22/2016, 9:00 AM

## 2016-07-23 ENCOUNTER — Other Ambulatory Visit: Payer: Self-pay | Admitting: Internal Medicine

## 2016-07-23 DIAGNOSIS — E559 Vitamin D deficiency, unspecified: Secondary | ICD-10-CM

## 2016-07-23 LAB — VITAMIN D 25 HYDROXY (VIT D DEFICIENCY, FRACTURES): Vit D, 25-Hydroxy: 15 ng/mL — ABNORMAL LOW (ref 30–100)

## 2016-07-23 MED ORDER — VITAMIN D (ERGOCALCIFEROL) 1.25 MG (50000 UNIT) PO CAPS: 50000.0000 [IU] | ORAL_CAPSULE | ORAL | 0 refills | Status: DC

## 2016-07-24 ENCOUNTER — Telehealth: Payer: Self-pay

## 2016-07-24 NOTE — Telephone Encounter (Signed)
Return pt call pt didn't answer lvm for pt to give the office a call back. If pt calls back I put results in phone note

## 2016-07-24 NOTE — Telephone Encounter (Signed)
Patient returning call to get results. Please f/u with patient. Thank you.

## 2016-07-24 NOTE — Telephone Encounter (Signed)
Error

## 2016-07-24 NOTE — Telephone Encounter (Signed)
Returned pt call pt didn't answer lvm asking pt to give me a call at his earliest convenience   If pt calls back please give pt results: Vitamin d persistently low, low vit D can cause bone/muscle pain. rx for Vit D replacement in chart, take weekly. Once done, buy Over the counter Vit D 5,000 IU and take daily. Kidney and liver labs within normal limits.

## 2016-07-25 MED FILL — VIT D2 1.25 MG (50,000 UNIT: 1.25 MG | 84 days supply | Qty: 12 | Fill #0

## 2016-07-30 ENCOUNTER — Other Ambulatory Visit: Payer: Self-pay

## 2016-07-30 DIAGNOSIS — B182 Chronic viral hepatitis C: Secondary | ICD-10-CM

## 2016-07-30 LAB — CBC WITH DIFFERENTIAL/PLATELET
BASOS ABS: 0 {cells}/uL (ref 0–200)
Basophils Relative: 0 %
EOS ABS: 153 {cells}/uL (ref 15–500)
EOS PCT: 3 %
HCT: 46.5 % (ref 38.5–50.0)
HEMOGLOBIN: 15.2 g/dL (ref 13.2–17.1)
LYMPHS ABS: 1734 {cells}/uL (ref 850–3900)
Lymphocytes Relative: 34 %
MCH: 28.5 pg (ref 27.0–33.0)
MCHC: 32.7 g/dL (ref 32.0–36.0)
MCV: 87.2 fL (ref 80.0–100.0)
Monocytes Absolute: 357 cells/uL (ref 200–950)
Monocytes Relative: 7 %
NEUTROS ABS: 2856 {cells}/uL (ref 1500–7800)
Neutrophils Relative %: 56 %
PLATELETS: 67 10*3/uL — AB (ref 140–400)
RBC: 5.33 MIL/uL (ref 4.20–5.80)
RDW: 15.1 % — ABNORMAL HIGH (ref 11.0–15.0)
WBC: 5.1 10*3/uL (ref 3.8–10.8)

## 2016-08-05 LAB — HEPATITIS C RNA QUANTITATIVE
HCV QUANT: NOT DETECTED [IU]/mL
HCV Quantitative Log: <1.18 Log IU/mL

## 2016-08-06 ENCOUNTER — Encounter: Payer: Self-pay | Admitting: Internal Medicine

## 2016-08-06 ENCOUNTER — Ambulatory Visit (INDEPENDENT_AMBULATORY_CARE_PROVIDER_SITE_OTHER): Payer: Self-pay | Admitting: Internal Medicine

## 2016-08-06 VITALS — BP 146/85 | HR 71 | Temp 98.0°F | Ht 68.0 in | Wt 214.0 lb

## 2016-08-06 DIAGNOSIS — F1010 Alcohol abuse, uncomplicated: Secondary | ICD-10-CM

## 2016-08-06 DIAGNOSIS — B182 Chronic viral hepatitis C: Secondary | ICD-10-CM

## 2016-08-06 DIAGNOSIS — K7460 Unspecified cirrhosis of liver: Secondary | ICD-10-CM

## 2016-08-06 NOTE — Assessment & Plan Note (Signed)
Used to drink daily and now much less.  I advised him though against any alcohol.

## 2016-08-06 NOTE — Progress Notes (Signed)
   Subjective:    Patient ID: Luis Morrison, male    DOB: 26-Apr-1956, 61 y.o.   MRN: [id number]  HPI Here for follow up of HCv and cirrhosis.  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 Now here for SVR 12 and is undetectable confirming cure.    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  He had stopped drinking and now with beer about once per week.  Is not taking his diuretic medications.     Review of Systems  Constitutional: Negative for fatigue.  Gastrointestinal: Negative for abdominal distention and diarrhea.  Skin: Negative for rash.  Neurological: Negative for dizziness.       Objective:   Physical Exam  Constitutional: He appears well-developed and well-nourished. No distress.  Eyes: No scleral icterus.  Cardiovascular: Normal rate, regular rhythm and normal heart sounds.   Pulmonary/Chest: Effort normal and breath sounds normal.  Skin: No rash noted.    SH: + occasional beer      Assessment & Plan:

## 2016-08-06 NOTE — Assessment & Plan Note (Addendum)
I will order [place] screening with ultrasound now and he will need a RUQ abdominal ultrasound every 6 months.  He prefers not to come back here so will see if he can get it from his PCP  He was advised to go to GI but has no showed/canceled twice with Dr. Loletha Carrow.  I advised him to go back but he did not seem interested.  I will defer to his PCP and did strongly recommend it for cirrhosis management.

## 2016-08-06 NOTE — Assessment & Plan Note (Signed)
Now considered cured.  Can rtc prn

## 2016-08-08 ENCOUNTER — Ambulatory Visit (HOSPITAL_COMMUNITY): Payer: Self-pay

## 2016-08-09 ENCOUNTER — Ambulatory Visit (HOSPITAL_COMMUNITY): Payer: No Typology Code available for payment source

## 2016-09-12 ENCOUNTER — Telehealth: Payer: Self-pay

## 2016-09-12 NOTE — Telephone Encounter (Signed)
Called to find out if pt would like to have colonoscopy scheduled 

## 2016-10-24 ENCOUNTER — Encounter: Payer: Self-pay | Admitting: Internal Medicine

## 2016-10-25 ENCOUNTER — Encounter: Payer: Self-pay | Admitting: Internal Medicine

## 2016-10-28 ENCOUNTER — Encounter: Payer: Self-pay | Admitting: Internal Medicine

## 2016-12-16 ENCOUNTER — Ambulatory Visit: Payer: Self-pay | Attending: Internal Medicine

## 2017-01-02 ENCOUNTER — Encounter: Payer: Self-pay | Admitting: Internal Medicine

## 2017-01-02 ENCOUNTER — Ambulatory Visit: Payer: Self-pay | Attending: Internal Medicine | Admitting: Internal Medicine

## 2017-01-02 VITALS — BP 148/87 | HR 73 | Temp 98.3°F | Resp 16 | Wt 210.8 lb

## 2017-01-02 DIAGNOSIS — R109 Unspecified abdominal pain: Secondary | ICD-10-CM | POA: Insufficient documentation

## 2017-01-02 DIAGNOSIS — Z5321 Procedure and treatment not carried out due to patient leaving prior to being seen by health care provider: Secondary | ICD-10-CM | POA: Insufficient documentation

## 2017-01-02 NOTE — Progress Notes (Signed)
Pt is requesting something stronger than ibuprofen.  Pt states when he gets the pain in his side sometimes the ibuprofen doesn't touch it.

## 2017-01-03 ENCOUNTER — Ambulatory Visit: Payer: Self-pay | Attending: Internal Medicine | Admitting: Internal Medicine

## 2017-01-03 ENCOUNTER — Encounter: Payer: Self-pay | Admitting: Internal Medicine

## 2017-01-03 VITALS — BP 120/70 | HR 74 | Temp 98.3°F | Resp 16 | Wt 210.2 lb

## 2017-01-03 DIAGNOSIS — B182 Chronic viral hepatitis C: Secondary | ICD-10-CM | POA: Insufficient documentation

## 2017-01-03 DIAGNOSIS — R1012 Left upper quadrant pain: Secondary | ICD-10-CM | POA: Insufficient documentation

## 2017-01-03 DIAGNOSIS — F1010 Alcohol abuse, uncomplicated: Secondary | ICD-10-CM | POA: Insufficient documentation

## 2017-01-03 DIAGNOSIS — K7460 Unspecified cirrhosis of liver: Secondary | ICD-10-CM | POA: Insufficient documentation

## 2017-01-03 DIAGNOSIS — I10 Essential (primary) hypertension: Secondary | ICD-10-CM | POA: Insufficient documentation

## 2017-01-03 DIAGNOSIS — Z79899 Other long term (current) drug therapy: Secondary | ICD-10-CM | POA: Insufficient documentation

## 2017-01-03 DIAGNOSIS — E559 Vitamin D deficiency, unspecified: Secondary | ICD-10-CM | POA: Insufficient documentation

## 2017-01-03 MED ORDER — OMEPRAZOLE 20 MG PO CPDR
20.0000 mg | DELAYED_RELEASE_CAPSULE | Freq: Every day | ORAL | 3 refills | Status: DC
Start: 2017-01-03 — End: 2017-01-27

## 2017-01-03 MED FILL — ?OMEPRAZOLE DR 20 MG CAPSUL: 20 | 30 days supply | Qty: 30 | Fill #0

## 2017-01-03 NOTE — Progress Notes (Signed)
Patient ID: Luis Morrison, male   DOB: Jan 12, 1956, 61 y.o.   MRN: [id number] Pt left before being seen.

## 2017-01-03 NOTE — Progress Notes (Signed)
Patient ID: Luis Morrison, male    DOB: [birth year]  MRN: [id number]  CC: Establish Care   Subjective: Luis Morrison is a 61 y.o. male who presents to est with me as PCP. Was here yesterday but left before being seen His concerns today include:  Hx Hep C status post successful treatment with Harvoni, Vit D def, ETOH abuse  1. Pain in LT side stomach at nights x 1 wk -no N/V/D/No constipation over past 1 wk -rates 6/10. "Like a moving pain." -no pain during the day.  No pain with meals.  -drinks 4-5 beers on Saturdays.  "It's hard to stop drinking because I've been doing it for so long -taking Ibuprofen 3 tabs daily for this pain.   2.HTN noted to be elevated today -no on spironolactone or furosemide Does not limit salt in foods  3. Completed high-dose vitamin D that was ordered for him by Dr. Clide Dales  4. History of hep C with cirrhosis. -Cured with Harvoni -ID specialist recommended that he establish with GI for continued management and monitoring of cirrhosis. Right upper quadrant ultrasound for HCC screening Q 6-12 months also recommended. Ordered by Dr. Linus Salmons when he was seen in February but patient did not have it done  Patient Active Problem List   Diagnosis Date Noted  . Alcohol abuse 08/06/2016  . Vitamin D deficiency 12/06/2015  . Cirrhosis ([place]) 10/09/2014  . Chronic hepatitis C without hepatic coma ([place]) 10/09/2014     Current Outpatient Prescriptions on File Prior to Visit  Medication Sig Dispense Refill  . albuterol (PROVENTIL) (2.5 MG/3ML) 0.083% nebulizer solution Take 2.5 mg by nebulization every 6 (six) hours as needed for wheezing or shortness of breath. Reported on 12/06/2015     No current facility-administered medications on file prior to visit.     No Known Allergies  Social History   Social History  . Marital status: Single    Spouse name: N/A  . Number of children: N/A  . Years of education: N/A   Occupational History  . Not on file.    Social History Main Topics  . Smoking status: Never Smoker  . Smokeless tobacco: Never Used  . Alcohol use 1.2 oz/week    2 Cans of beer per week  . Drug use: No  . Sexual activity: Not on file   Other Topics Concern  . Not on file   Social History Narrative  . No narrative on file    Family History  Problem Relation Age of Onset  . Cancer Mother     No past surgical history on file.  ROS: Review of Systems Negative except as stated above PHYSICAL EXAM: BP (!) 161/89   Pulse 74   Temp 98.3 F (36.8 C) (Oral)   Resp 16   Wt 210 lb 3.2 oz (95.3 kg)   SpO2 96%   BMI 31.96 kg/m    Repeat BP 120/70 Physical Exam  General appearance - alert, well appearing, and in no distress Mental status - alert, oriented to person, place, and time, normal mood, behavior, speech, dress, motor activity, and thought processes Eyes - pupils equal and reactive, extraocular eye movements intact Mouth - mucous membranes moist, pharynx normal without lesions Neck - supple, no significant adenopathy Chest - clear to auscultation, no wheezes, rales or rhonchi, symmetric air entry Heart - normal rate, regular rhythm, normal S1, S2, no murmurs, rubs, clicks or gallops Abdomen - hyperactive bowel sounds. Nondistended. No tenderness Extremities -  no lower extremity edema  ASSESSMENT AND PLAN: 1. Left upper quadrant pain -Of questionable etiology. Will give a trial of omeprazole. Patient is agreeable to GI referral for colonoscopy and for management and monitoring of cirrhosis - Ambulatory referral to Gastroenterology - omeprazole (PRILOSEC) 20 MG capsule; Take 1 capsule (20 mg total) by mouth daily.  Dispense: 30 capsule; Refill: 3  2. Cirrhosis of liver without ascites, unspecified hepatic cirrhosis type ([place]) - Ambulatory referral to Gastroenterology  3. Vitamin D deficiency Advise to take Vit D 1000 IU daily OTC  4. Alcohol abuse Strongly advised complete abstinence  5. Chronic  hepatitis C without hepatic coma ([facility]) -cured.  My nurse will call and make the appointment for him to have that ultrasound done that was ordered by ID this year. Advise patient of need to have ultrasound done every 6-12 months  Patient was given the opportunity to ask questions.  Patient verbalized understanding of the plan and was able to repeat key elements of the plan.   Orders Placed This Encounter  Procedures  . Ambulatory referral to Gastroenterology     Requested Prescriptions   Signed Prescriptions Disp Refills  . omeprazole (PRILOSEC) 20 MG capsule 30 capsule 3    Sig: Take 1 capsule (20 mg total) by mouth daily.    Return in about 4 months (around 05/06/2017).  Karle Plumber, MD, FACP

## 2017-01-03 NOTE — Patient Instructions (Addendum)
Stop Ibuprofen. Start Omeprazole/Prilosec as discussed. Follow up if no improvement. Please keep appointment for ultra sound of Liver.  You have been referred to gastroenterology for colonoscopy and follow up of cirrhosis.   Try to cut back on drinking.

## 2017-01-06 ENCOUNTER — Telehealth: Payer: Self-pay

## 2017-01-06 NOTE — Telephone Encounter (Signed)
Contacted pt to go over appointment information for his [place].   [place] ischedule for January 10, 2017 @ 830am at [facility]. Pt is to arrive at 815am. Pt is to NPO after 12am. Pt is aware of appointment information.  Dr. Wynetta Emery pt is requesting something to help him with his pain. Pt states he has been having some pain and it gets worse sometimes. Could you f/u

## 2017-01-06 NOTE — Telephone Encounter (Signed)
Contacted pt to inform him of Dr. Wynetta Emery respone pt states he understands and he will get some tyenol

## 2017-01-10 ENCOUNTER — Ambulatory Visit (HOSPITAL_COMMUNITY)
Admission: RE | Admit: 2017-01-10 | Discharge: 2017-01-10 | Disposition: A | Discharge status: Home / Self Care | Payer: Self-pay | Source: Ambulatory Visit | Attending: Internal Medicine | Admitting: Internal Medicine

## 2017-01-10 DIAGNOSIS — K7460 Unspecified cirrhosis of liver: Secondary | ICD-10-CM | POA: Insufficient documentation

## 2017-01-10 DIAGNOSIS — Z1389 Encounter for screening for other disorder: Secondary | ICD-10-CM | POA: Insufficient documentation

## 2017-01-15 ENCOUNTER — Telehealth: Payer: Self-pay | Admitting: Internal Medicine

## 2017-01-15 DIAGNOSIS — K7469 Other cirrhosis of liver: Secondary | ICD-10-CM

## 2017-01-15 NOTE — Telephone Encounter (Signed)
Patient called would like to know Ultrasound Results,   Please follow up.

## 2017-01-15 NOTE — Telephone Encounter (Signed)
Haven't received results once I get them will contact pt

## 2017-01-17 NOTE — Telephone Encounter (Signed)
Patient called stated concerned because has not received phone call regarding [place] result,  Informed patient no results received yet, But will message nurse to check on it.  Per patient would like a call either way.  Please follow up.

## 2017-01-17 NOTE — Telephone Encounter (Signed)
When I receive results will give patient a call

## 2017-01-21 NOTE — Telephone Encounter (Signed)
Pt called baxck again need to talk about the result for the [place] plus he is having pain, please follow up

## 2017-01-21 NOTE — Telephone Encounter (Signed)
Will forward to pcp

## 2017-01-21 NOTE — Telephone Encounter (Signed)
Luis Morrison

## 2017-01-22 NOTE — Telephone Encounter (Signed)
Pt is aware of results and doesn't have any questions or concerns 

## 2017-01-24 ENCOUNTER — Encounter: Payer: Self-pay | Admitting: Gastroenterology

## 2017-01-27 ENCOUNTER — Other Ambulatory Visit: Payer: Self-pay | Admitting: Internal Medicine

## 2017-01-27 ENCOUNTER — Telehealth: Payer: Self-pay | Admitting: Internal Medicine

## 2017-01-27 DIAGNOSIS — R1012 Left upper quadrant pain: Secondary | ICD-10-CM

## 2017-01-27 MED ORDER — OMEPRAZOLE 20 MG PO CPDR: 20.0000 mg | DELAYED_RELEASE_CAPSULE | Freq: Two times a day (BID) | ORAL | 3 refills | Status: DC

## 2017-01-27 MED FILL — ?OMEPRAZOLE DR 20 MG CAPSUL: 20 | 20 days supply | Qty: 40 | Fill #0

## 2017-01-27 NOTE — Telephone Encounter (Signed)
Patient was not prescribed 2 capsules daily. Will forward to Dr. Wynetta Emery.

## 2017-01-27 NOTE — Telephone Encounter (Signed)
Per last telephone conversation routed to my nurse about pt, I did advise increase Omeprazole to 20 mg BID.

## 2017-01-27 NOTE — Telephone Encounter (Signed)
Patient came into office requesting omeprazole (PRILOSEC) 20 MG capsule to be changed to 90 pills instead of 30 since he is taking 2 capsules per day. Please f/up

## 2017-02-13 MED FILL — ?OMEPRAZOLE DR 20 MG CAPSUL: 20 | 20 days supply | Qty: 40 | Fill #1

## 2017-02-25 ENCOUNTER — Encounter: Payer: Self-pay | Admitting: Internal Medicine

## 2017-02-25 ENCOUNTER — Ambulatory Visit: Payer: Self-pay | Attending: Internal Medicine | Admitting: Internal Medicine

## 2017-02-25 VITALS — BP 134/84 | HR 77 | Temp 98.6°F | Resp 18 | Ht 70.0 in | Wt 214.0 lb

## 2017-02-25 DIAGNOSIS — R188 Other ascites: Secondary | ICD-10-CM | POA: Insufficient documentation

## 2017-02-25 DIAGNOSIS — E559 Vitamin D deficiency, unspecified: Secondary | ICD-10-CM | POA: Insufficient documentation

## 2017-02-25 DIAGNOSIS — Z809 Family history of malignant neoplasm, unspecified: Secondary | ICD-10-CM | POA: Insufficient documentation

## 2017-02-25 DIAGNOSIS — K7460 Unspecified cirrhosis of liver: Secondary | ICD-10-CM

## 2017-02-25 DIAGNOSIS — Z8619 Personal history of other infectious and parasitic diseases: Secondary | ICD-10-CM | POA: Insufficient documentation

## 2017-02-25 DIAGNOSIS — F1010 Alcohol abuse, uncomplicated: Secondary | ICD-10-CM | POA: Insufficient documentation

## 2017-02-25 DIAGNOSIS — Z23 Encounter for immunization: Secondary | ICD-10-CM

## 2017-02-25 DIAGNOSIS — L299 Pruritus, unspecified: Secondary | ICD-10-CM

## 2017-02-25 MED ORDER — HYDROCORTISONE 2.5 % EX CREA: TOPICAL_CREAM | Freq: Two times a day (BID) | CUTANEOUS | 0 refills | Status: DC

## 2017-02-25 MED ORDER — LORATADINE 10 MG PO TABS: 10.0000 mg | ORAL_TABLET | Freq: Every day | ORAL | 1 refills | Status: DC

## 2017-02-25 MED FILL — HYDROCORTISONE 2.5% CREAM: 2.5 | 7 days supply | Qty: 30 | Fill #0

## 2017-02-25 NOTE — Progress Notes (Signed)
Patient ID: Luis Morrison, male    DOB: 11-16-1955  MRN: [id number]  CC: No chief complaint on file.   Subjective: Luis Morrison is a 61 y.o. male who presents for UC visit.  His concerns today include:  Hx Hep C status post successful treatment with Harvoni, Vit D def, ETOH abuse  1."My hands are on fire." -burning and itching x 4 days. No rash, just burning.  -not used any chemicals at home, no plant exposure. No new medications except omeprazole which he has been on for several weeks now -no previous episodes -better with soaking hands in ice water.  He has not noticed whether it is worse in warm water with taking shower  2. History of hepatitis C with cirrhosis post successful treatment with Harvoni -He has not received GI appointment as yet for colonoscopy and for routine follow-up on cirrhosis as recommended by his ID specialist Dr. Linus Salmons -Abdominal pain much better with omeprazole Patient Active Problem List   Diagnosis Date Noted  . Alcohol abuse 08/06/2016  . Vitamin D deficiency 12/06/2015  . Cirrhosis ([place]) 10/09/2014  . Chronic hepatitis C without hepatic coma ([place]) 10/09/2014     Current Outpatient Prescriptions on File Prior to Visit  Medication Sig Dispense Refill  . omeprazole (PRILOSEC) 20 MG capsule Take 1 capsule (20 mg total) by mouth 2 (two) times daily before a meal. 40 capsule 3  . albuterol (PROVENTIL) (2.5 MG/3ML) 0.083% nebulizer solution Take 2.5 mg by nebulization every 6 (six) hours as needed for wheezing or shortness of breath. Reported on 12/06/2015     No current facility-administered medications on file prior to visit.     No Known Allergies  Social History   Social History  . Marital status: Single    Spouse name: N/A  . Number of children: N/A  . Years of education: N/A   Occupational History  . Not on file.   Social History Main Topics  . Smoking status: Never Smoker  . Smokeless tobacco: Never Used  . Alcohol use 1.2 oz/week   2 Cans of beer per week  . Drug use: No  . Sexual activity: Not on file   Other Topics Concern  . Not on file   Social History Narrative  . No narrative on file    Family History  Problem Relation Age of Onset  . Cancer Mother     No past surgical history on file.  ROS: Review of Systems  PHYSICAL EXAM: BP 134/84 (BP Location: Right Arm, Patient Position: Sitting, Cuff Size: Large)   Pulse 77   Temp 98.6 F (37 C) (Oral)   Resp 18   Ht 5\' 10"  (1.778 m)   Wt 214 lb (97.1 kg)   SpO2 97%   BMI 30.71 kg/m   Wt Readings from Last 3 Encounters:  02/25/17 214 lb (97.1 kg)  01/03/17 210 lb 3.2 oz (95.3 kg)  01/02/17 210 lb 12.8 oz (95.6 kg)   Physical Exam General appearance - alert, well appearing, and in no distress Mental status - alert, oriented to person, place, and time, normal mood, behavior, speech, dress, motor activity, and thought processes Eyes - non-icteric Skin - Hands: no rash. Palms warm, very slight erythema  Lab Results  Component Value Date   WBC 5.1 07/30/2016   HGB 15.2 07/30/2016   HCT 46.5 07/30/2016   MCV 87.2 07/30/2016   PLT 67 (L) 07/30/2016     Chemistry  Component Value Date/Time   NA 137 07/22/2016 0903   K 3.8 07/22/2016 0903   CL 109 07/22/2016 0903   CO2 23 07/22/2016 0903   BUN 10 07/22/2016 0903   CREATININE 1.00 07/22/2016 0903      Component Value Date/Time   CALCIUM 8.9 07/22/2016 0903   ALKPHOS 85 07/22/2016 0903   AST 33 07/22/2016 0903   ALT 19 07/22/2016 0903   BILITOT 0.9 07/22/2016 0903      Liver US 01/2017 Liver:  Nodular heterogeneous. No focal hepatic abnormality or biliary ductal dilatation.  IMPRESSION: Changes of cirrhosis.  No focal hepatic abnormality.  ASSESSMENT AND PLAN: 1. Itching with irritation ? Etiology. He has cut back on ETOH -will check some base labs including LFTs and HIV Trial of Claritin and hydrocortisone cream - Comprehensive metabolic panel - CBC - loratadine  (CLARITIN) 10 MG tablet; Take 1 tablet (10 mg total) by mouth daily.  Dispense: 30 tablet; Refill: 1 - hydrocortisone 2.5 % cream; Apply topically 2 (two) times daily.  Dispense: 30 g; Refill: 0 - HIV antibody  2. Need for influenza vaccination - Flu Vaccine QUAD 6+ mos PF IM (Fluarix Quad PF)  3. Cirrhosis of liver with ascites, unspecified hepatic cirrhosis type [facility]) - Ambulatory referral to Gastroenterology  Patient was given the opportunity to ask questions.  Patient verbalized understanding of the plan and was able to repeat key elements of the plan.   Orders Placed This Encounter  Procedures  . Flu Vaccine QUAD 6+ mos PF IM (Fluarix Quad PF)  . Comprehensive metabolic panel  . CBC  . HIV antibody  . Ambulatory referral to Gastroenterology     Requested Prescriptions   Signed Prescriptions Disp Refills  . loratadine (CLARITIN) 10 MG tablet 30 tablet 1    Sig: Take 1 tablet (10 mg total) by mouth daily.  . hydrocortisone 2.5 % cream 30 g 0    Sig: Apply topically 2 (two) times daily.    No Follow-up on file.  Karle Plumber, MD, FACP

## 2017-02-25 NOTE — Patient Instructions (Addendum)
Take the Claritin daily and use the Hydrocortisone cream twice a day as needed.  Let me know if symtoms improve.   Influenza Virus Vaccine injection (Fluarix) What is this medicine? INFLUENZA VIRUS VACCINE (in floo EN zuh VAHY ruhs vak SEEN) helps to reduce the risk of getting influenza also known as the flu. This medicine may be used for other purposes; ask your health care provider or pharmacist if you have questions. COMMON BRAND NAME(S): Fluarix, Fluzone What should I tell my health care provider before I take this medicine? They need to know if you have any of these conditions: -bleeding disorder like hemophilia -fever or infection -Guillain-Barre syndrome or other neurological problems -immune system problems -infection with the human immunodeficiency virus (HIV) or AIDS -low blood platelet counts -multiple sclerosis -an unusual or allergic reaction to influenza virus vaccine, eggs, chicken proteins, latex, gentamicin, other medicines, foods, dyes or preservatives -pregnant or trying to get pregnant -breast-feeding How should I use this medicine? This vaccine is for injection into a muscle. It is given by a health care professional. A copy of Vaccine Information Statements will be given before each vaccination. Read this sheet carefully each time. The sheet may change frequently. Talk to your pediatrician regarding the use of this medicine in children. Special care may be needed. Overdosage: If you think you have taken too much of this medicine contact a poison control center or emergency room at once. NOTE: This medicine is only for you. Do not share this medicine with others. What if I miss a dose? This does not apply. What may interact with this medicine? -chemotherapy or radiation therapy -medicines that lower your immune system like etanercept, anakinra, infliximab, and adalimumab -medicines that treat or prevent blood clots like warfarin -phenytoin -steroid medicines like  prednisone or cortisone -theophylline -vaccines This list may not describe all possible interactions. Give your health care provider a list of all the medicines, herbs, non-prescription drugs, or dietary supplements you use. Also tell them if you smoke, drink alcohol, or use illegal drugs. Some items may interact with your medicine. What should I watch for while using this medicine? Report any side effects that do not go away within 3 days to your doctor or health care professional. Call your health care provider if any unusual symptoms occur within 6 weeks of receiving this vaccine. You may still catch the flu, but the illness is not usually as bad. You cannot get the flu from the vaccine. The vaccine will not protect against colds or other illnesses that may cause fever. The vaccine is needed every year. What side effects may I notice from receiving this medicine? Side effects that you should report to your doctor or health care professional as soon as possible: -allergic reactions like skin rash, itching or hives, swelling of the face, lips, or tongue Side effects that usually do not require medical attention (report to your doctor or health care professional if they continue or are bothersome): -fever -headache -muscle aches and pains -pain, tenderness, redness, or swelling at site where injected -weak or tired This list may not describe all possible side effects. Call your doctor for medical advice about side effects. You may report side effects to FDA at 1-800-FDA-1088. Where should I keep my medicine? This vaccine is only given in a clinic, pharmacy, doctor's office, or other health care setting and will not be stored at home. NOTE: This sheet is a summary. It may not cover all possible information. If you have questions  about this medicine, talk to your doctor, pharmacist, or health care provider.  2018 Elsevier/Gold Standard (2007-12-23 09:30:40)

## 2017-02-26 ENCOUNTER — Telehealth: Payer: Self-pay | Admitting: Internal Medicine

## 2017-02-26 DIAGNOSIS — R7303 Prediabetes: Secondary | ICD-10-CM | POA: Insufficient documentation

## 2017-02-26 LAB — COMPREHENSIVE METABOLIC PANEL
ALT: 35 IU/L (ref 0–44)
AST: 46 IU/L — AB (ref 0–40)
Albumin/Globulin Ratio: 0.9 — ABNORMAL LOW (ref 1.2–2.2)
Albumin: 3.9 g/dL (ref 3.6–4.8)
Alkaline Phosphatase: 115 IU/L (ref 39–117)
BUN/Creatinine Ratio: 8 — ABNORMAL LOW (ref 10–24)
BUN: 8 mg/dL (ref 8–27)
Bilirubin Total: 0.9 mg/dL (ref 0.0–1.2)
CALCIUM: 9.3 mg/dL (ref 8.6–10.2)
CHLORIDE: 105 mmol/L (ref 96–106)
CO2: 20 mmol/L (ref 20–29)
Creatinine, Ser: 1.01 mg/dL (ref 0.76–1.27)
GFR, EST AFRICAN AMERICAN: 91 mL/min/{1.73_m2} (ref >59)
GFR, EST NON AFRICAN AMERICAN: 79 mL/min/{1.73_m2} (ref >59)
GLUCOSE: 110 mg/dL — AB (ref 65–99)
Globulin, Total: 4.3 g/dL (ref 1.5–4.5)
POTASSIUM: 4.2 mmol/L (ref 3.5–5.2)
Sodium: 139 mmol/L (ref 134–144)
TOTAL PROTEIN: 8.2 g/dL (ref 6.0–8.5)

## 2017-02-26 LAB — CBC
HEMOGLOBIN: 16.1 g/dL (ref 13.0–17.7)
Hematocrit: 47.9 % (ref 37.5–51.0)
MCH: 28.9 pg (ref 26.6–33.0)
MCHC: 33.6 g/dL (ref 31.5–35.7)
MCV: 86 fL (ref 79–97)
PLATELETS: 69 10*3/uL — AB (ref 150–379)
RBC: 5.57 x10E6/uL (ref 4.14–5.80)
RDW: 15.8 % — ABNORMAL HIGH (ref 12.3–15.4)
WBC: 6.5 10*3/uL (ref 3.4–10.8)

## 2017-02-26 LAB — HIV ANTIBODY (ROUTINE TESTING W REFLEX): HIV SCREEN 4TH GENERATION: NONREACTIVE

## 2017-02-26 NOTE — Telephone Encounter (Signed)
PC placed to pt today to discuss lab results. Pt informed that he has a mild elevation in one of his liver enzymes and low PLT both c/w known cirrhosis. PLT count stable compared to several mths ago.  HIV test negative. BS 110 (pt states he did not eat anything yesterday morning before blood test) in the range for preDM. In looking through his chart, he had A1C last yr May of 6.1. Discussed healthy eating habits (limiting sweet drinks, ETOH and sugar snacks and white carbs) and need for regular aerobic exercise to help prevent progression to DM. Pt reports that the itching in his hands went away after taking Claritin. Pt also informed that he has appt with [facility] next mth. I gave him the date and time that I see in the system.

## 2017-03-05 ENCOUNTER — Telehealth: Payer: Self-pay | Admitting: Internal Medicine

## 2017-03-05 DIAGNOSIS — L299 Pruritus, unspecified: Secondary | ICD-10-CM

## 2017-03-05 NOTE — Telephone Encounter (Signed)
Will forward to pcp

## 2017-03-05 NOTE — Telephone Encounter (Signed)
Contacted pt and pt states the itching did stop but it started back patient states he is requesting something better to help with the itching. Pt states the referral to a derm will be fine. But he is hoping to get something for the itching for the time being

## 2017-03-05 NOTE — Telephone Encounter (Signed)
Pt. Called requesting to speak with his nurse regarding   loratadine (CLARITIN) 10 MG tablet   hydrocortisone 2.5 % cream    Pt. States that it is not helping him and does not know what to do.  Please f/u

## 2017-03-06 ENCOUNTER — Encounter (HOSPITAL_COMMUNITY): Payer: Self-pay

## 2017-03-06 ENCOUNTER — Emergency Department (HOSPITAL_COMMUNITY)
Admission: EM | Admit: 2017-03-06 | Discharge: 2017-03-06 | Disposition: A | Discharge status: Home / Self Care | Payer: Self-pay | Attending: Emergency Medicine | Admitting: Emergency Medicine

## 2017-03-06 DIAGNOSIS — R202 Paresthesia of skin: Secondary | ICD-10-CM | POA: Insufficient documentation

## 2017-03-06 DIAGNOSIS — R208 Other disturbances of skin sensation: Secondary | ICD-10-CM

## 2017-03-06 MED ORDER — TRIAMCINOLONE ACETONIDE 0.1 % EX CREA: 1.0000 "application " | TOPICAL_CREAM | Freq: Two times a day (BID) | CUTANEOUS | 0 refills | Status: DC

## 2017-03-06 MED ORDER — DIPHENHYDRAMINE HCL 25 MG PO CAPS: 25.0000 mg | ORAL_CAPSULE | Freq: Four times a day (QID) | ORAL | 0 refills | Status: DC | PRN

## 2017-03-06 MED ORDER — DIPHENHYDRAMINE HCL 25 MG PO CAPS
25.0000 mg | ORAL_CAPSULE | Freq: Once | ORAL | Status: AC
  Administered 2017-03-06: 25 mg via ORAL
  Filled 2017-03-06: qty 1

## 2017-03-06 MED FILL — ?TRIAMCINOLONE 0.1% CRM: 0.1 | 15 days supply | Qty: 30 | Fill #0

## 2017-03-06 NOTE — Telephone Encounter (Signed)
Contacted pt to inform him of Dr. Wynetta Emery response pt states he is at the ED because he was up all night scratching and he was unable to go to sleep

## 2017-03-06 NOTE — ED Provider Notes (Signed)
[facility] DEPT Provider Note   CSN: [id number] Arrival date & time: 03/06/17  0086     History   Chief Complaint Chief Complaint  Patient presents with  . Rash    HPI Luis Morrison is a 61 y.o. male.  The history is provided by the patient and medical records.  Rash      61 year old male with history of alcohol abuse, vitamin D deficiency, cirrhosis, hepatitis C status post treatment with Harvoni, presenting to the ED with burning and itching sensation of his palms. He saw his primary care doctor for this on 02/25/2017 and had screening labs done including CBC, CMP, and HIV panel. All of these labs were overall reassuring. Did have mild elevation of his AST at 46.  States he was prescribed Claritin as well as hydrocortisone cream which she has been using without any relief. States he hardly got any sleep last night he was rubbing his palms together all night. He denies any rash of his hands or other bodily areas. 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 He has not had any bug bites or plant exposures. No other new medications other than what he was prescribed. No fever or chills. No known allergies. Patient states he did call his primary care doctor back today and they are referring him to dermatology, but states he did not feel he could wait for that appointment.  History reviewed. No pertinent past medical history.  Patient Active Problem List   Diagnosis Date Noted  . Prediabetes 02/26/2017  . Alcohol abuse 08/06/2016  . Vitamin D deficiency 12/06/2015  . Cirrhosis ([place]) 10/09/2014  . Hepatitis C virus infection cured after antiviral drug therapy 10/09/2014    History reviewed. No pertinent surgical history.     Home Medications    Prior to Admission medications   Medication Sig Start Date End Date Taking? Authorizing Provider  albuterol (PROVENTIL) (2.5 MG/3ML) 0.083% nebulizer solution Take  2.5 mg by nebulization every 6 (six) hours as needed for wheezing or shortness of breath. Reported on 12/06/2015    [provider]  hydrocortisone 2.5 % cream Apply topically 2 (two) times daily. 02/25/17   Ladell Pier, MD  loratadine (CLARITIN) 10 MG tablet Take 1 tablet (10 mg total) by mouth daily. 02/25/17   Ladell Pier, MD  omeprazole (PRILOSEC) 20 MG capsule Take 1 capsule (20 mg total) by mouth 2 (two) times daily before a meal. 01/27/17   Ladell Pier, MD    Family History Family History  Problem Relation Age of Onset  . Cancer Mother     Social History Social History  Substance Use Topics  . Smoking status: Never Smoker  . Smokeless tobacco: Never Used  . Alcohol use 1.2 oz/week    2 Cans of beer per week     Allergies   Patient has no known allergies.   Review of Systems Review of Systems  Skin:       Skin irritation  All other systems reviewed and are negative.    Physical Exam Updated Vital Signs BP 133/80 (BP Location: Right Arm)   Pulse 83   Temp 98 F (36.7 C) (Oral)   Resp 19   Ht 5\' 10"  (1.778 m)   Wt 96.6 kg (213 lb)   SpO2 96%   BMI 30.56 kg/m   Physical Exam  Constitutional: He is oriented to person, place,  and time. He appears well-developed and well-nourished.  HENT:  Head: Normocephalic and atraumatic.  Mouth/Throat: Oropharynx is clear and moist.  Eyes: Pupils are equal, round, and reactive to light. Conjunctivae and EOM are normal.  Neck: Normal range of motion.  Cardiovascular: Normal rate, regular rhythm and normal heart sounds.   Pulmonary/Chest: Effort normal and breath sounds normal. No respiratory distress. He has no wheezes.  Abdominal: Soft. Bowel sounds are normal. There is no tenderness. There is no rebound.  Musculoskeletal: Normal range of motion.  Hands overall normal in appearance, skin of the palms appears red and irritated as patient is actively scratching and rubbing his palms together during  exam, there is no focal rash, no swelling, no open wounds or sores, no tissue crepitus  Neurological: He is alert and oriented to person, place, and time.  Skin: Skin is warm and dry.  Psychiatric: He has a normal mood and affect.  Nursing note and vitals reviewed.    ED Treatments / Results  Labs (all labs ordered are listed, but only abnormal results are displayed) Labs Reviewed - No data to display  EKG  EKG Interpretation None       Radiology No results found.  Procedures Procedures (including critical care time)  Medications Ordered in ED Medications  diphenhydrAMINE (BENADRYL) capsule 25 mg (25 mg Oral Given 03/06/17 1159)     Initial Impression / Assessment and Plan / ED Course  I have reviewed the triage vital signs and the nursing notes.  Pertinent labs & imaging results that were available during my care of the patient were reviewed by me and considered in my medical decision making (see chart for details).  61 year old male here with itching and burning to the palms. Seen by PCP for same on 02/25/2017. Reports continued symptoms. He did trial of hydrocortisone and Claritin without change. He denies any fever or chills. He is afebrile and nontoxic in appearance here. His hands are overall normal in appearance without visible rash or wounds/sores.  His hands do appear splotchy as he is constantly rubbing and scratching his hands during exam.  Had recent labs done which I reviewed-- mild elevation of AST at 46 and chronic thrombocytopenia, otherwise largely WNL.  His white count was normal. HIV testing was negative. Given there is no actual rash, do not suspect that this is related to syphilis, RMSF, or other tickborne illness. No local plant contact.  Does report using chemicals to wash his car but has been doing so for years. Question if this is a contact related issue. Will have him start Benadryl, Kenalog ointment. He has been referred to dermatology by his PCP so will  follow-up with them. He understands to call his doctor if any acute changes arise.  Return precautions given.  Patient discharged home in stable condition.  Final Clinical Impressions(s) / ED Diagnoses   Final diagnoses:  Burning sensation of skin    New Prescriptions Discharge Medication List as of 03/06/2017  1:01 PM    START taking these medications   Details  diphenhydrAMINE (BENADRYL) 25 mg capsule Take 1 capsule (25 mg total) by mouth every 6 (six) hours as needed., Starting Thu 03/06/2017, Print    triamcinolone cream (KENALOG) 0.1 % Apply 1 application topically 2 (two) times daily., Starting Thu 03/06/2017, Print         Larene Pickett, PA-C 03/06/17 1436    Carmin Muskrat, MD 03/06/17 [phone]

## 2017-03-06 NOTE — ED Triage Notes (Signed)
Pt presents to the ed with complaints of itching in his palms bilaterally. Pt has been seen by his doctor and was given Claritin and hydrocortisone cream with no relief.

## 2017-03-06 NOTE — Discharge Instructions (Signed)
Take the prescribed medication as directed. Follow-up with your primary care doctor. They have referred you to dermatology so follow-up with them when scheduled. Return to the ED for new or worsening symptoms.

## 2017-03-10 MED FILL — ?OMEPRAZOLE DR 20 MG CAPSUL: 20 | 20 days supply | Qty: 40 | Fill #2

## 2017-03-14 ENCOUNTER — Ambulatory Visit: Payer: Self-pay | Attending: Internal Medicine

## 2017-03-20 ENCOUNTER — Encounter: Payer: Self-pay | Admitting: Gastroenterology

## 2017-03-20 ENCOUNTER — Ambulatory Visit (INDEPENDENT_AMBULATORY_CARE_PROVIDER_SITE_OTHER): Payer: Self-pay | Admitting: Gastroenterology

## 2017-03-20 ENCOUNTER — Other Ambulatory Visit (INDEPENDENT_AMBULATORY_CARE_PROVIDER_SITE_OTHER): Payer: Self-pay

## 2017-03-20 VITALS — BP 128/82 | HR 68 | Ht 70.0 in | Wt 211.8 lb

## 2017-03-20 DIAGNOSIS — D696 Thrombocytopenia, unspecified: Secondary | ICD-10-CM

## 2017-03-20 DIAGNOSIS — Z1211 Encounter for screening for malignant neoplasm of colon: Secondary | ICD-10-CM

## 2017-03-20 DIAGNOSIS — K7460 Unspecified cirrhosis of liver: Secondary | ICD-10-CM

## 2017-03-20 DIAGNOSIS — Z8619 Personal history of other infectious and parasitic diseases: Secondary | ICD-10-CM

## 2017-03-20 LAB — PROTIME-INR
INR: 1.3 ratio — AB (ref 0.8–1.0)
PROTHROMBIN TIME: 14.3 s — AB (ref 9.6–13.1)

## 2017-03-20 MED ORDER — NA SULFATE-K SULFATE-MG SULF 17.5-3.13-1.6 GM/177ML PO SOLN: 1.0000 | Freq: Once | ORAL | 0 refills | Status: AC

## 2017-03-20 MED FILL — SUPREP BOWEL PREP KIT: 17.5-3.13-1 | 1 days supply | Qty: 354 | Fill #0

## 2017-03-20 NOTE — Patient Instructions (Signed)
If you are age 63 or older, your body mass index should be between 23-30. Your Body mass index is 30.39 kg/m. If this is out of the aforementioned range listed, please consider follow up with your Primary Care Provider.  If you are age 81 or younger, your body mass index should be between 19-25. Your Body mass index is 30.39 kg/m. If this is out of the aformentioned range listed, please consider follow up with your Primary Care Provider.   You have been scheduled for an endoscopy and colonoscopy. Please follow the written instructions given to you at your visit today. Please pick up your prep supplies at the pharmacy within the next 1-3 days. If you use inhalers (even only as needed), please bring them with you on the day of your procedure. Your physician has requested that you go to www.startemmi.com and enter the access code given to you at your visit today. This web site gives a general overview about your procedure. However, you should still follow specific instructions given to you by our office regarding your preparation for the procedure.  Thank you for choosing [facility]  Dr Wilfrid Lund III

## 2017-03-20 NOTE — Progress Notes (Signed)
[facility] Consult Note:  History: Luis Morrison 03/20/2017  Referring physician: Ladell Pier, Morrison  Reason for consult/chief complaint: Cirrhosis (Discuss Endo/Colon, patient has no compliants)   Subjective  HPI:  This is a 61 year old man referred by primary care for cirrhosis in consideration of colon cancer screening. He was referred to [place] late last year, but rescheduled her visit in January and again in August of this year. Luis Morrison was diagnosed with liver cirrhosis at least 2 years ago, and it is from hepatitis C infection and alcohol abuse. His hepatitis C was cured with combination of harvoni and ribavirin, according to the last office note by infectious disease. He apparently cut back his alcohol use, but tells me that he still has some beer on weekends. He has no history of hepatic encephalopathy, volume overload or GI bleeding. He has no family history of colon or rectal cancer, and has not had prior screening. Luis Morrison denies abdominal pain, altered bowel habits, rectal bleeding, nausea, vomiting, dysphagia or weight change.  ROS:  Review of Systems  Constitutional: Negative for appetite change and unexpected weight change.  HENT: Negative for mouth sores and voice change.   Eyes: Negative for pain and redness.  Respiratory: Negative for cough and shortness of breath.   Cardiovascular: Negative for chest pain and palpitations.  Genitourinary: Negative for dysuria and hematuria.  Musculoskeletal: Negative for arthralgias and myalgias.  Skin: Negative for pallor and rash.  Neurological: Negative for weakness and headaches.  Hematological: Negative for adenopathy.     Past Medical History:  Chronic hepatitis C Cirrhosis  Past Surgical History: History reviewed. No pertinent surgical history.   Family History: Family History  Problem Relation Age of Onset  . Cancer Mother     Social History: Social History   Social History  .  Marital status: Single    Spouse name: N/A  . Number of children: N/A  . Years of education: N/A   Social History Main Topics  . Smoking status: Never Smoker  . Smokeless tobacco: Never Used  . Alcohol use 1.2 oz/week    2 Cans of beer per week  . Drug use: No  . Sexual activity: Not Asked   Other Topics Concern  . None   Social History Narrative  . None   Still beer on weekends  Allergies: No Known Allergies  Outpatient Meds: Current Outpatient Prescriptions  Medication Sig Dispense Refill  . omeprazole (PRILOSEC) 20 MG capsule Take 1 capsule (20 mg total) by mouth 2 (two) times daily before a meal. 40 capsule 3  . Na Sulfate-K Sulfate-Mg Sulf 17.5-3.13-1.6 GM/180ML SOLN Take 1 kit by mouth once. 354 mL 0   No current facility-administered medications for this visit.       ___________________________________________________________________ Objective   Exam:  BP 128/82   Pulse 68   Ht '5\' 10"'  (1.778 m)   Wt 211 lb 12.8 oz (96.1 kg)   BMI 30.39 kg/m    General: this is a(n) Overweight and well-appearing man   Eyes: sclera anicteric, no redness  ENT: oral mucosa moist without lesions, no cervical or supraclavicular lymphadenopathy, good dentition  CV: RRR without murmur, S1/S2, no JVD, no peripheral edema  Resp: clear to auscultation bilaterally, normal RR and effort noted  GI: soft, no tenderness, with active bowel sounds. No appreciable hepatomegaly and no spleen tip palpable  Skin; warm and dry, no rash or jaundice noted  Neuro: awake, alert and oriented x 3. Normal gross motor  function and fluent speech  Labs:  CMP Latest Ref Rng & Units 02/25/2017 07/22/2016 10/10/2015  Glucose 65 - 99 mg/dL 110(H) 110(H) 81  BUN 8 - 27 mg/dL '8 10 8  ' Creatinine 0.76 - 1.27 mg/dL 1.01 1.00 0.95  Sodium 134 - 144 mmol/L 139 137 138  Potassium 3.5 - 5.2 mmol/L 4.2 3.8 3.8  Chloride 96 - 106 mmol/L 105 109 105  CO2 20 - 29 mmol/L '20 23 23  ' Calcium 8.6 - 10.2 mg/dL  9.3 8.9 8.7  Total Protein 6.0 - 8.5 g/dL 8.2 7.9 7.9  Total Bilirubin 0.0 - 1.2 mg/dL 0.9 0.9 1.1  Alkaline Phos 39 - 117 IU/L 115 85 96  AST 0 - 40 IU/L 46(H) 33 65(H)  ALT 0 - 44 IU/L 35 19 47(H)   CBC Latest Ref Rng & Units 02/25/2017 07/30/2016 04/09/2016  WBC 3.4 - 10.8 x10E3/uL 6.5 5.1 5.7  Hemoglobin 13.0 - 17.7 g/dL 16.1 15.2 13.7  Hematocrit 37.5 - 51.0 % 47.9 46.5 41.4  Platelets 150 - 379 x10E3/uL 69(LL) 67(L) 71(L)   Last known INR = 1.24 in may 2016  Radiologic Studies:  RUQ U/S 01/10/17:  Cirrhosis, no liver mass  Assessment: Encounter Diagnoses  Name Primary?  . Cirrhosis of liver without ascites, unspecified hepatic cirrhosis type ([place]) Yes  . Thrombocytopenia ([place])   . Special screening for malignant neoplasms, colon   . Hepatitis C virus infection cured after antiviral drug therapy     Compensated cirrhosis. We discussed the possible complications of this and the long-term risk of liver cancer. He is up-to-date on ultrasound, and needs an AFP lab. He also needs an INR for updated MELD calculation and because he will have endoscopic procedures.  I also showed him a diagram of GI anatomy and explained that he must be screened for esophageal varices. He is also in need of colon cancer screening.  Plan:  EGD and colonoscopy, he is agreeable  The benefits and risks of the planned procedure were described in detail with the patient or (when appropriate) their health care proxy.  Risks were outlined as including, but not limited to, bleeding, infection, perforation, adverse medication reaction leading to cardiac or pulmonary decompensation, or pancreatitis (if ERCP).  The limitation of incomplete mucosal visualization was also discussed.  No guarantees or warranties were given.  He reports that he has had his flu shot already. I will send this note to his primary care physician and recommend that he receive a pneumococcal vaccine if not previously given.  He is advised  never to have sushi due to an increased risk of vibrio infection.  Thank you for the courtesy of this consult.  Please call me with any questions or concerns.  Luis Morrison  CC: Luis Morrison  Luis Morrison

## 2017-03-21 LAB — AFP TUMOR MARKER: AFP-Tumor Marker: 5.1 ng/mL (ref <6.1)

## 2017-04-07 MED FILL — ?OMEPRAZOLE DR 20 MG CAPSUL: 20 | 20 days supply | Qty: 40 | Fill #3

## 2017-04-18 ENCOUNTER — Ambulatory Visit: Payer: Self-pay | Attending: Internal Medicine | Admitting: Internal Medicine

## 2017-04-18 ENCOUNTER — Encounter: Payer: Self-pay | Admitting: Internal Medicine

## 2017-04-18 VITALS — BP 133/84 | HR 91 | Temp 98.2°F | Resp 16 | Wt 217.0 lb

## 2017-04-18 DIAGNOSIS — F1010 Alcohol abuse, uncomplicated: Secondary | ICD-10-CM | POA: Insufficient documentation

## 2017-04-18 DIAGNOSIS — B1920 Unspecified viral hepatitis C without hepatic coma: Secondary | ICD-10-CM | POA: Insufficient documentation

## 2017-04-18 DIAGNOSIS — R7303 Prediabetes: Secondary | ICD-10-CM | POA: Insufficient documentation

## 2017-04-18 DIAGNOSIS — K219 Gastro-esophageal reflux disease without esophagitis: Secondary | ICD-10-CM | POA: Insufficient documentation

## 2017-04-18 DIAGNOSIS — Z79899 Other long term (current) drug therapy: Secondary | ICD-10-CM | POA: Insufficient documentation

## 2017-04-18 DIAGNOSIS — K7460 Unspecified cirrhosis of liver: Secondary | ICD-10-CM | POA: Insufficient documentation

## 2017-04-18 DIAGNOSIS — H5210 Myopia, unspecified eye: Secondary | ICD-10-CM

## 2017-04-18 DIAGNOSIS — E559 Vitamin D deficiency, unspecified: Secondary | ICD-10-CM | POA: Insufficient documentation

## 2017-04-18 NOTE — Progress Notes (Signed)
Patient ID: Luis Morrison, male    DOB: 11-07-1955  MRN: [id number]  CC: Referral (eye doctor)   Subjective: Luis Morrison is a 61 y.o. male who presents for UC visit. His concerns today include:  Hx Hep Cstatus post successful treatment with Harvoni, cirrhosis, GERD, Vit D def, ETOH abuse, preDM  1. Needs referral for eye exam  -problems with distance vision.  -he said he spoke with receptionist at Dr. Marylouise Morrison office and was told that they do accept the OC.  2. He has seen [facility]. Plan for EGD (screening for varices) and c-scope. Patient Active Problem List   Diagnosis Date Noted  . Prediabetes 02/26/2017  . Alcohol abuse 08/06/2016  . Vitamin D deficiency 12/06/2015  . Cirrhosis ([place]) 10/09/2014  . Hepatitis C virus infection cured after antiviral drug therapy 10/09/2014     Current Outpatient Medications on File Prior to Visit  Medication Sig Dispense Refill  . omeprazole (PRILOSEC) 20 MG capsule Take 1 capsule (20 mg total) by mouth 2 (two) times daily before a meal. 40 capsule 3   No current facility-administered medications on file prior to visit.     No Known Allergies  Social History   Socioeconomic History  . Marital status: Single    Spouse name: Not on file  . Number of children: Not on file  . Years of education: Not on file  . Highest education level: Not on file  Social Needs  . Financial resource strain: Not on file  . Food insecurity - worry: Not on file  . Food insecurity - inability: Not on file  . Transportation needs - medical: Not on file  . Transportation needs - non-medical: Not on file  Occupational History  . Not on file  Tobacco Use  . Smoking status: Never Smoker  . Smokeless tobacco: Never Used  Substance and Sexual Activity  . Alcohol use: Yes    Alcohol/week: 1.2 oz    Types: 2 Cans of beer per week  . Drug use: No  . Sexual activity: Not on file  Other Topics Concern  . Not on file  Social History Narrative  . Not on  file    Family History  Problem Relation Age of Onset  . Cancer Mother     No past surgical history on file.  ROS: Review of Systems  PHYSICAL EXAM: BP 133/84   Pulse 91   Temp 98.2 F (36.8 C) (Oral)   Resp 16   Wt 217 lb (98.4 kg)   SpO2 96%   BMI 31.14 kg/m   Wt Readings from Last 3 Encounters:  04/18/17 217 lb (98.4 kg)  03/20/17 211 lb 12.8 oz (96.1 kg)  03/06/17 213 lb (96.6 kg)   Physical Exam  General appearance - alert, well appearing, and in no distress Mental status - alert, oriented to person, place, and time, normal mood, behavior, speech, dress, motor activity, and thought processes    Chemistry      Component Value Date/Time   NA 139 02/25/2017 1014   K 4.2 02/25/2017 1014   CL 105 02/25/2017 1014   CO2 20 02/25/2017 1014   BUN 8 02/25/2017 1014   CREATININE 1.01 02/25/2017 1014   CREATININE 1.00 07/22/2016 0903      Component Value Date/Time   CALCIUM 9.3 02/25/2017 1014   ALKPHOS 115 02/25/2017 1014   AST 46 (H) 02/25/2017 1014   ALT 35 02/25/2017 1014   BILITOT 0.9 02/25/2017 1014  Lab Results  Component Value Date   WBC 6.5 02/25/2017   HGB 16.1 02/25/2017   HCT 47.9 02/25/2017   MCV 86 02/25/2017   PLT 69 (LL) 02/25/2017    ASSESSMENT AND PLAN: 1. Myopia, unspecified laterality - Ambulatory referral to Optometry  Patient was given the opportunity to ask questions.  Patient verbalized understanding of the plan and was able to repeat key elements of the plan.   Orders Placed This Encounter  Procedures  . Ambulatory referral to Optometry     Requested Prescriptions    No prescriptions requested or ordered in this encounter    F/u in 4 mths Luis Plumber, MD, Rosalita Chessman

## 2017-04-18 NOTE — Patient Instructions (Signed)
I have submitted referral for you to see the optometristt.

## 2017-05-08 ENCOUNTER — Encounter: Payer: Self-pay | Admitting: Gastroenterology

## 2017-05-08 ENCOUNTER — Other Ambulatory Visit: Payer: Self-pay

## 2017-05-08 ENCOUNTER — Ambulatory Visit (AMBULATORY_SURGERY_CENTER): Payer: Self-pay | Admitting: Gastroenterology

## 2017-05-08 VITALS — BP 126/79 | HR 82 | Temp 98.6°F | Resp 18 | Ht 70.0 in | Wt 211.0 lb

## 2017-05-08 DIAGNOSIS — Z1212 Encounter for screening for malignant neoplasm of rectum: Secondary | ICD-10-CM

## 2017-05-08 DIAGNOSIS — K7030 Alcoholic cirrhosis of liver without ascites: Secondary | ICD-10-CM

## 2017-05-08 DIAGNOSIS — Z1211 Encounter for screening for malignant neoplasm of colon: Secondary | ICD-10-CM

## 2017-05-08 MED ORDER — SODIUM CHLORIDE 0.9 % IV SOLN: 500.0000 mL | INTRAVENOUS | Status: DC

## 2017-05-08 MED ORDER — NADOLOL 20 MG PO TABS: 20.0000 mg | ORAL_TABLET | Freq: Every day | ORAL | 2 refills | Status: DC

## 2017-05-08 MED FILL — NADOLOL 20 MG TAB: 20 | 30 days supply | Qty: 30 | Fill #0

## 2017-05-08 NOTE — Patient Instructions (Signed)
Discharge instructions given. Prescription sent to pharmacy. Resume previous medications. YOU HAD AN ENDOSCOPIC PROCEDURE TODAY AT [facility]:   Refer to the procedure report that was given to you for any specific questions about what was found during the examination.  If the procedure report does not answer your questions, please call your gastroenterologist to clarify.  If you requested that your care partner not be given the details of your procedure findings, then the procedure report has been included in a sealed envelope for you to review at your convenience later.  YOU SHOULD EXPECT: Some feelings of bloating in the abdomen. Passage of more gas than usual.  Walking can help get rid of the air that was put into your GI tract during the procedure and reduce the bloating. If you had a lower endoscopy (such as a colonoscopy or flexible sigmoidoscopy) you may notice spotting of blood in your stool or on the toilet paper. If you underwent a bowel prep for your procedure, you may not have a normal bowel movement for a few days.  Please Note:  You might notice some irritation and congestion in your nose or some drainage.  This is from the oxygen used during your procedure.  There is no need for concern and it should clear up in a day or so.  SYMPTOMS TO REPORT IMMEDIATELY:   Following lower endoscopy (colonoscopy or flexible sigmoidoscopy):  Excessive amounts of blood in the stool  Significant tenderness or worsening of abdominal pains  Swelling of the abdomen that is new, acute  Fever of 100F or higher   Following upper endoscopy (EGD)  Vomiting of blood or coffee ground material  New chest pain or pain under the shoulder blades  Painful or persistently difficult swallowing  New shortness of breath  Fever of 100F or higher  Black, tarry-looking stools  For urgent or emergent issues, a gastroenterologist can be reached at any hour by calling [phone].   DIET:   We do recommend a small meal at first, but then you may proceed to your regular diet.  Drink plenty of fluids but you should avoid alcoholic beverages for 24 hours.  ACTIVITY:  You should plan to take it easy for the rest of today and you should NOT DRIVE or use heavy machinery until tomorrow (because of the sedation medicines used during the test).    FOLLOW UP: Our staff will call the number listed on your records the next business day following your procedure to check on you and address any questions or concerns that you may have regarding the information given to you following your procedure. If we do not reach you, we will leave a message.  However, if you are feeling well and you are not experiencing any problems, there is no need to return our call.  We will assume that you have returned to your regular daily activities without incident.  If any biopsies were taken you will be contacted by phone or by letter within the next 1-3 weeks.  Please call us at [phone] if you have not heard about the biopsies in 3 weeks.    SIGNATURES/CONFIDENTIALITY: You and/or your care partner have signed paperwork which will be entered into your electronic medical record.  These signatures attest to the fact that that the information above on your After Visit Summary has been reviewed and is understood.  Full responsibility of the confidentiality of this discharge information lies with you and/or your care-partner.

## 2017-05-08 NOTE — Op Note (Signed)
[facility] Patient Name: Luis Morrison Procedure Date: 05/08/2017 10:44 AM MRN: [id number] Endoscopist: Mallie Mussel L. Loletha Carrow , MD Age: 61 Referring MD:  Date of Birth: 10-Feb-1956 Gender: Male Account #: [id number] Procedure:                Upper GI endoscopy Indications:              Cirrhosis rule out esophageal varices Medicines:                Monitored Anesthesia Care Procedure:                Pre-Anesthesia Assessment:                           - Prior to the procedure, a History and Physical                            was performed, and patient medications and                            allergies were reviewed. The patient's tolerance of                            previous anesthesia was also reviewed. The risks                            and benefits of the procedure and the sedation                            options and risks were discussed with the patient.                            All questions were answered, and informed consent                            was obtained. Prior Anticoagulants: The patient has                            taken no previous anticoagulant or antiplatelet                            agents. ASA Grade Assessment: III - A patient with                            severe systemic disease. After reviewing the risks                            and benefits, the patient was deemed in                            satisfactory condition to undergo the procedure.                           After obtaining informed consent, the endoscope was  passed under direct vision. Throughout the                            procedure, the patient's blood pressure, pulse, and                            oxygen saturations were monitored continuously. The                            Endoscope was introduced through the mouth, and                            advanced to the second part of duodenum. The upper                            GI endoscopy was  accomplished without difficulty.                            The patient tolerated the procedure well. Scope In: Scope Out: Findings:                 The larynx was normal.                           Grade II varices were found in the middle third of                            the esophagus and in the lower third of the                            esophagus.                           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                           The cardia and gastric fundus were normal on                            retroflexion. Specifically, no gastric varices were                            seen.                           The examined duodenum was normal. Complications:            No immediate complications. Estimated Blood Loss:     Estimated blood loss: none. Impression:               - Normal larynx.                           - Grade II esophageal varices.                           -  Portal hypertensive gastropathy.                           - Normal examined duodenum.                           - No specimens collected. Recommendation:           - Patient has a contact number available for                            emergencies. The signs and symptoms of potential                            delayed complications were discussed with the                            patient. Return to normal activities tomorrow.                            Written discharge instructions were provided to the                            patient.                           - Resume previous diet.                           - Continue present medications.                           - Begin nadolol 20 mg once daily for variceal                            bleeding prophylaxis. Disp #30, RF 2                           - Return to my office in 2 months.                           - Stop alcohol use.                           - See the other procedure  note for documentation of                            additional recommendations. Cathie Bonnell L. Loletha Carrow, MD 05/08/2017 11:27:12 AM This report has been signed electronically.

## 2017-05-08 NOTE — Progress Notes (Signed)
No egg or soy allergy per pt.  Pt's states no medical or surgical changes since previsit or office visit.

## 2017-05-08 NOTE — Op Note (Signed)
[facility] Patient Name: Quantrell Splitt Procedure Date: 05/08/2017 10:44 AM MRN: [id number] Endoscopist: Mallie Mussel L. Loletha Carrow , MD Age: 61 Referring MD:  Date of Birth: July 24, 1955 Gender: Male Account #: [id number] Procedure:                Colonoscopy Indications:              Screening for colorectal malignant neoplasm, This                            is the patient's first colonoscopy Medicines:                Monitored Anesthesia Care Procedure:                Pre-Anesthesia Assessment:                           - Prior to the procedure, a History and Physical                            was performed, and patient medications and                            allergies were reviewed. The patient's tolerance of                            previous anesthesia was also reviewed. The risks                            and benefits of the procedure and the sedation                            options and risks were discussed with the patient.                            All questions were answered, and informed consent                            was obtained. Prior Anticoagulants: The patient has                            taken no previous anticoagulant or antiplatelet                            agents. ASA Grade Assessment: III - A patient with                            severe systemic disease. After reviewing the risks                            and benefits, the patient was deemed in                            satisfactory condition to undergo the procedure.  After obtaining informed consent, the colonoscope                            was passed under direct vision. Throughout the                            procedure, the patient's blood pressure, pulse, and                            oxygen saturations were monitored continuously. The                            Colonoscope was introduced through the anus and                            advanced to the the cecum,  identified by                            appendiceal orifice and ileocecal valve. The                            colonoscopy was performed without difficulty. The                            patient tolerated the procedure well. The quality                            of the bowel preparation was excellent. The                            ileocecal valve, appendiceal orifice, and rectum                            were photographed. The quality of the bowel                            preparation was evaluated using the BBPS [facility]                            Bowel Preparation Scale) with scores of: Right                            Colon = 3, Transverse Colon = 3 and Left Colon = 3                            (entire mucosa seen well with no residual staining,                            small fragments of stool or opaque liquid). The                            total BBPS score equals 9. The bowel preparation  used was SUPREP. Scope In: 11:04:03 AM Scope Out: 11:17:52 AM Scope Withdrawal Time: 0 hours 11 minutes 6 seconds  Total Procedure Duration: 0 hours 13 minutes 49 seconds  Findings:                 The perianal and digital rectal examinations were                            normal.                           Internal hemorrhoids were found during retroflexion                            and during anoscopy. The hemorrhoids were                            medium-sized and Grade I (internal hemorrhoids that                            do not prolapse).                           The exam was otherwise without abnormality on                            direct and retroflexion views. Complications:            No immediate complications. Estimated Blood Loss:     Estimated blood loss: none. Impression:               - Internal hemorrhoids.                           - The examination was otherwise normal on direct                            and retroflexion views.                            - No specimens collected. Recommendation:           - Patient has a contact number available for                            emergencies. The signs and symptoms of potential                            delayed complications were discussed with the                            patient. Return to normal activities tomorrow.                            Written discharge instructions were provided to the                            patient.                           -  Resume previous diet.                           - Continue present medications.                           - Repeat colonoscopy in 10 years for screening                            purposes. Henry L. Loletha Carrow, MD 05/08/2017 11:30:49 AM This report has been signed electronically.

## 2017-05-08 NOTE — Progress Notes (Signed)
TO PACU  Pt awake and alert. Report to RN 

## 2017-05-09 ENCOUNTER — Telehealth: Payer: Self-pay | Admitting: *Deleted

## 2017-05-09 ENCOUNTER — Other Ambulatory Visit: Payer: Self-pay | Admitting: Internal Medicine

## 2017-05-09 DIAGNOSIS — R1012 Left upper quadrant pain: Secondary | ICD-10-CM

## 2017-05-09 MED FILL — ?OMEPRAZOLE DR 20MG CAPSULE: 20 | 30 days supply | Qty: 60 | Fill #0

## 2017-05-09 NOTE — Telephone Encounter (Signed)
  Follow up Call-  Call back number 05/08/2017  Post procedure Call Back phone  # [phone]  or [phone]  Permission to leave phone message Yes  Some recent data might be hidden     Patient questions:  Do you have a fever, pain , or abdominal swelling? No. Pain Score  0 *  Have you tolerated food without any problems? Yes.    Have you been able to return to your normal activities? Yes.    Do you have any questions about your discharge instructions: Diet   No. Medications  No. Follow up visit  No.  Do you have questions or concerns about your Care? No.  Actions: * If pain score is 4 or above: No action needed, pain <4.

## 2017-05-21 ENCOUNTER — Telehealth: Payer: Self-pay | Admitting: Internal Medicine

## 2017-05-21 MED ORDER — PANTOPRAZOLE SODIUM 40 MG PO TBEC: 40.0000 mg | DELAYED_RELEASE_TABLET | Freq: Every day | ORAL | 3 refills | Status: DC

## 2017-05-21 NOTE — Telephone Encounter (Signed)
Patients friend called to say that the medication omeprazole gives him bad headaches. Please fu with patient. [phone]

## 2017-05-22 NOTE — Telephone Encounter (Signed)
Patient verified DOB Patient is aware of Protonix being sent to the pharmacy in place of the omeprazole.

## 2017-05-30 ENCOUNTER — Ambulatory Visit: Payer: Self-pay | Attending: Internal Medicine | Admitting: Internal Medicine

## 2017-05-30 ENCOUNTER — Encounter: Payer: Self-pay | Admitting: Internal Medicine

## 2017-05-30 VITALS — BP 130/80 | HR 70 | Temp 98.3°F | Resp 16 | Wt 220.8 lb

## 2017-05-30 DIAGNOSIS — Z79899 Other long term (current) drug therapy: Secondary | ICD-10-CM | POA: Insufficient documentation

## 2017-05-30 DIAGNOSIS — E669 Obesity, unspecified: Secondary | ICD-10-CM | POA: Insufficient documentation

## 2017-05-30 DIAGNOSIS — I8510 Secondary esophageal varices without bleeding: Secondary | ICD-10-CM | POA: Insufficient documentation

## 2017-05-30 DIAGNOSIS — R7303 Prediabetes: Secondary | ICD-10-CM | POA: Insufficient documentation

## 2017-05-30 DIAGNOSIS — F1010 Alcohol abuse, uncomplicated: Secondary | ICD-10-CM | POA: Insufficient documentation

## 2017-05-30 DIAGNOSIS — R188 Other ascites: Secondary | ICD-10-CM | POA: Insufficient documentation

## 2017-05-30 DIAGNOSIS — K219 Gastro-esophageal reflux disease without esophagitis: Secondary | ICD-10-CM | POA: Insufficient documentation

## 2017-05-30 DIAGNOSIS — K7460 Unspecified cirrhosis of liver: Secondary | ICD-10-CM | POA: Insufficient documentation

## 2017-05-30 DIAGNOSIS — K3189 Other diseases of stomach and duodenum: Secondary | ICD-10-CM | POA: Insufficient documentation

## 2017-05-30 DIAGNOSIS — H00011 Hordeolum externum right upper eyelid: Secondary | ICD-10-CM | POA: Insufficient documentation

## 2017-05-30 DIAGNOSIS — Z808 Family history of malignant neoplasm of other organs or systems: Secondary | ICD-10-CM | POA: Insufficient documentation

## 2017-05-30 DIAGNOSIS — Z6830 Body mass index (BMI) 30.0-30.9, adult: Secondary | ICD-10-CM | POA: Insufficient documentation

## 2017-05-30 DIAGNOSIS — E559 Vitamin D deficiency, unspecified: Secondary | ICD-10-CM | POA: Insufficient documentation

## 2017-05-30 DIAGNOSIS — B1920 Unspecified viral hepatitis C without hepatic coma: Secondary | ICD-10-CM | POA: Insufficient documentation

## 2017-05-30 MED FILL — ?PANTOPRAZOLE SOD DR 40MG: 40 MG | 30 days supply | Qty: 30 | Fill #0

## 2017-05-30 NOTE — Progress Notes (Signed)
Patient ID: Luis Morrison, male    DOB: 1955-09-04  MRN: [id number]  CC: Eye Problem   Subjective: Luis Morrison is a 61 y.o. male who presents for chronic ds management.  His concerns today include:  Hx Hep Cstatus post successful treatment with Harvoni, cirrhosis, GERD, Vit D def, ETOH abuse, preDM  1. Cirrhosis: had EDG which revealed grade 2 esophageal varices and portal hypertensive gastropathy.  Patient started on nadolol but he is not picked up the prescription as yet. -He is down to 1 beer on the weekends.  2. GERD: Prilosec caused HA. We changed it to Protonix.  Tolerating this much better.  3.  Has sty on RT upper eye lid x 2 wks. He has been squeezing it and had "plan to cut it off."   4. Gained 9 lbs since last visit.  Snacking a lot at nights while watching TV. Instead of fresh fruits, he eats fruit cuptail.  Down to drinking 1 beer on wkend Patient Active Problem List   Diagnosis Date Noted  . Secondary esophageal varices without bleeding ([place]) 05/30/2017  . Prediabetes 02/26/2017  . Alcohol abuse 08/06/2016  . Vitamin D deficiency 12/06/2015  . Cirrhosis ([place]) 10/09/2014  . Hepatitis C virus infection cured after antiviral drug therapy 10/09/2014     Current Outpatient Medications on File Prior to Visit  Medication Sig Dispense Refill  . cholecalciferol (VITAMIN D) 1000 units tablet Take 1,000 Units by mouth daily.    . hydrocortisone (ANUSOL-HC) 2.5 % rectal cream APPLY TOPICALLY 2 TIMES DAILY  0  . loratadine (CLARITIN) 10 MG tablet TAKE 1 TABLET  10 MG TOTAL  BY MOUTH DAILY  1  . nadolol (CORGARD) 20 MG tablet Take 1 tablet (20 mg total) by mouth daily. 30 tablet 2  . pantoprazole (PROTONIX) 40 MG tablet Take 1 tablet (40 mg total) by mouth daily. 30 tablet 3   No current facility-administered medications on file prior to visit.     No Known Allergies  Social History   Socioeconomic History  . Marital status: Single    Spouse name: Not on file  .  Number of children: Not on file  . Years of education: Not on file  . Highest education level: Not on file  Social Needs  . Financial resource strain: Not on file  . Food insecurity - worry: Not on file  . Food insecurity - inability: Not on file  . Transportation needs - medical: Not on file  . Transportation needs - non-medical: Not on file  Occupational History  . Not on file  Tobacco Use  . Smoking status: Never Smoker  . Smokeless tobacco: Never Used  Substance and Sexual Activity  . Alcohol use: Yes    Alcohol/week: 1.2 oz    Types: 2 Cans of beer per week  . Drug use: No  . Sexual activity: Not on file  Other Topics Concern  . Not on file  Social History Narrative  . Not on file    Family History  Problem Relation Age of Onset  . Cancer Mother   . Brain cancer Mother   . Colon cancer Neg Hx   . Colon polyps Neg Hx   . Esophageal cancer Neg Hx   . Rectal cancer Neg Hx   . Stomach cancer Neg Hx     No past surgical history on file.  ROS: Review of Systems Negative except as stated above. PHYSICAL EXAM: BP 130/80   Pulse  70   Temp 98.3 F (36.8 C) (Oral)   Resp 16   Wt 220 lb 12.8 oz (100.2 kg)   SpO2 95%   BMI 31.68 kg/m   Wt Readings from Last 3 Encounters:  05/30/17 220 lb 12.8 oz (100.2 kg)  05/08/17 211 lb (95.7 kg)  04/18/17 217 lb (98.4 kg)    Physical Exam General appearance - alert, well appearing, and in no distress Mental status - alert, oriented to person, place, and time, normal mood, behavior, speech, dress, motor activity, and thought processes Eyes -small stye right upper eyelid.   Chest -clear to auscultation bilaterally. Heart - normal rate, regular rhythm, normal S1, S2, no murmurs, rubs, clicks or gallops Abdomen - soft, nontender, nondistended, no masses or organomegaly Extremities - peripheral pulses normal, no pedal edema, no clubbing or cyanosis   ASSESSMENT AND PLAN: 1. Cirrhosis of liver with ascites, unspecified  hepatic cirrhosis type [facility]) Advised patient to pick up the prescription for the nadolol at the pharmacy today. Encouraged him to try to abstain from alcohol altogether.  2. Gastroesophageal reflux disease without esophagitis Continue Protonix.  3. Hordeolum externum of right upper eyelid Advised him to stop squeezing the lesion.  Advised to apply warm compresses 3 times a day for 10 minutes. If no improvement he will follow-up  4. Secondary esophageal varices without bleeding ([place]) See #1 above.  5. Obesity (BMI 30-39.9) Cautioned about weight gain.  Discussed healthy eating habits.  Advised to avoid snacking so much at nights.  Recommended fresh fruits and vegetables rather than canned fruits.  Patient was given the opportunity to ask questions.  Patient verbalized understanding of the plan and was able to repeat key elements of the plan.   No orders of the defined types were placed in this encounter.    Requested Prescriptions    No prescriptions requested or ordered in this encounter    Return in about 3 months (around 08/28/2017).  Karle Plumber, MD, FACP

## 2017-05-30 NOTE — Patient Instructions (Signed)
Stop squeezing the sty on your eye.  Apply warm compresses 3 times a day.   Cut back on snacking.  Try to eat more fresh fruits and vegetables rather than can fruits.   Please stop a the pharmacy and pick up the medication called Nadolol that was prescribed by the gastroenterologist.   Stye A stye is a bump on your eyelid caused by a bacterial infection. A stye can form inside the eyelid (internal stye) or outside the eyelid (external stye). An internal stye may be caused by an infected oil-producing gland inside your eyelid. An external stye may be caused by an infection at the base of your eyelash (hair follicle). Styes are very common. Anyone can get them at any age. They usually occur in just one eye, but you may have more than one in either eye. What are the causes? The infection is almost always caused by bacteria called Staphylococcus aureus. This is a common type of bacteria that lives on your skin. What increases the risk? You may be at higher risk for a stye if you have had one before. You may also be at higher risk if you have:  Diabetes.  Long-term illness.  Long-term eye redness.  A skin condition called seborrhea.  High fat levels in your blood (lipids).  What are the signs or symptoms? Eyelid pain is the most common symptom of a stye. Internal styes are more painful than external styes. Other signs and symptoms may include:  Painful swelling of your eyelid.  A scratchy feeling in your eye.  Tearing and redness of your eye.  Pus draining from the stye.  How is this diagnosed? Your health care provider may be able to diagnose a stye just by examining your eye. The health care provider may also check to make sure:  You do not have a fever or other signs of a more serious infection.  The infection has not spread to other parts of your eye or areas around your eye.  How is this treated? Most styes will clear up in a few days without treatment. In some cases, you  may need to use antibiotic drops or ointment to prevent infection. Your health care provider may have to drain the stye surgically if your stye is:  Large.  Causing a lot of pain.  Interfering with your vision.  This can be done using a thin blade or a needle. Follow these instructions at home:  Take medicines only as directed by your health care provider.  Apply a clean, warm compress to your eye for 10 minutes, 4 times a day.  Do not wear contact lenses or eye makeup until your stye has healed.  Do not try to pop or drain the stye. Contact a health care provider if:  You have chills or a fever.  Your stye does not go away after several days.  Your stye affects your vision.  Your eyeball becomes swollen, red, or painful. This information is not intended to replace advice given to you by your health care provider. Make sure you discuss any questions you have with your health care provider. Document Released: 03/06/2005 Document Revised: 01/21/2016 Document Reviewed: 09/10/2013 Elsevier Interactive Patient Education  [employer].

## 2017-06-20 ENCOUNTER — Ambulatory Visit: Payer: Self-pay | Attending: Internal Medicine

## 2017-06-20 MED FILL — NADOLOL 20 MG TAB: 20 | 30 days supply | Qty: 30 | Fill #1

## 2017-07-03 ENCOUNTER — Encounter: Payer: Self-pay | Admitting: Gastroenterology

## 2017-07-16 ENCOUNTER — Encounter: Payer: Self-pay | Admitting: Internal Medicine

## 2017-07-16 NOTE — Progress Notes (Signed)
Patient seen 06/17/2017.  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  Recommendation is to monitor once a year.  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  Patient advised to discontinue drinking.  Plan is for follow-up again in 1 year.

## 2017-07-21 MED FILL — ?PANTOPRAZOLE SOD DR 40MG: 40 MG | 30 days supply | Qty: 30 | Fill #1

## 2017-08-08 MED FILL — NADOLOL 20 MG TAB: 20 | 30 days supply | Qty: 30 | Fill #2

## 2017-09-02 ENCOUNTER — Ambulatory Visit: Payer: Self-pay | Admitting: Gastroenterology

## 2017-09-08 MED FILL — ?PANTOPRAZOLE SO DR 40MG TA: 40 | 30 days supply | Qty: 30 | Fill #2

## 2017-10-06 MED FILL — ?PANTOPRAZOLE SO DR 40MG TA: 40 | 30 days supply | Qty: 30 | Fill #3

## 2017-10-08 ENCOUNTER — Other Ambulatory Visit (INDEPENDENT_AMBULATORY_CARE_PROVIDER_SITE_OTHER): Payer: Self-pay

## 2017-10-08 MED ORDER — PANTOPRAZOLE SODIUM 40 MG PO TBEC: 40.0000 mg | DELAYED_RELEASE_TABLET | Freq: Every day | ORAL | 2 refills | Status: DC

## 2017-10-09 ENCOUNTER — Encounter: Payer: Self-pay | Admitting: Internal Medicine

## 2017-10-09 ENCOUNTER — Telehealth: Payer: Self-pay | Admitting: Internal Medicine

## 2017-10-09 ENCOUNTER — Ambulatory Visit: Payer: Self-pay | Attending: Internal Medicine | Admitting: Internal Medicine

## 2017-10-09 VITALS — BP 133/85 | HR 105 | Temp 99.1°F | Resp 16 | Wt 212.8 lb

## 2017-10-09 DIAGNOSIS — K219 Gastro-esophageal reflux disease without esophagitis: Secondary | ICD-10-CM

## 2017-10-09 DIAGNOSIS — B1920 Unspecified viral hepatitis C without hepatic coma: Secondary | ICD-10-CM | POA: Insufficient documentation

## 2017-10-09 DIAGNOSIS — Z79899 Other long term (current) drug therapy: Secondary | ICD-10-CM | POA: Insufficient documentation

## 2017-10-09 DIAGNOSIS — Z8719 Personal history of other diseases of the digestive system: Secondary | ICD-10-CM

## 2017-10-09 DIAGNOSIS — I8510 Secondary esophageal varices without bleeding: Secondary | ICD-10-CM | POA: Insufficient documentation

## 2017-10-09 DIAGNOSIS — H25013 Cortical age-related cataract, bilateral: Secondary | ICD-10-CM

## 2017-10-09 DIAGNOSIS — K7460 Unspecified cirrhosis of liver: Secondary | ICD-10-CM | POA: Insufficient documentation

## 2017-10-09 DIAGNOSIS — R030 Elevated blood-pressure reading, without diagnosis of hypertension: Secondary | ICD-10-CM

## 2017-10-09 DIAGNOSIS — E559 Vitamin D deficiency, unspecified: Secondary | ICD-10-CM | POA: Insufficient documentation

## 2017-10-09 DIAGNOSIS — R7303 Prediabetes: Secondary | ICD-10-CM | POA: Insufficient documentation

## 2017-10-09 DIAGNOSIS — E669 Obesity, unspecified: Secondary | ICD-10-CM | POA: Insufficient documentation

## 2017-10-09 DIAGNOSIS — F1010 Alcohol abuse, uncomplicated: Secondary | ICD-10-CM | POA: Insufficient documentation

## 2017-10-09 MED ORDER — NADOLOL 20 MG PO TABS: 20.0000 mg | ORAL_TABLET | Freq: Every day | ORAL | 2 refills | Status: DC

## 2017-10-09 MED FILL — NADOLOL 20 MG TAB: 20 | 30 days supply | Qty: 30 | Fill #0

## 2017-10-09 NOTE — Telephone Encounter (Signed)
-----   Message from Jackelyn Knife, [place] sent at 10/08/2017  5:20 PM EDT ----- Regarding: Medication Refill Pt is requesting a 90 day supply of Nadolol sent to [facility] pharmacy. Please f/u

## 2017-10-09 NOTE — Patient Instructions (Signed)
Please cut back on added salt to your food.

## 2017-10-09 NOTE — Progress Notes (Signed)
Patient ID: Luis Morrison, male    DOB: [birth year]  MRN: [id number]  CC: Medication Management   Subjective: Luis Morrison is a 62 y.o. male who presents for chronic ds management.  Last seen in 05/2018 His concerns today include:  Hx Hep Cstatus post successful treatment with Harvoni,cirrhosis, GERD,Vit D def, ETOH abuse, preDM  1.  Saw optometrist since last visit.  Dx with cataract.  Given rxn for glasses she has not required as yet.  2.  Cirrhosis: Patient with history of esophageal varices.  Requesting refill on nadolol.  This is already been sent to the pharmacy.  He denies any abdominal pain nausea or vomiting at this time.  No jaundice.  No lower extremity edema.  No abdominal distention.  3.  Obesity: He has lost 8 pounds since last visit.  He reports that he is eating better and moving more.  Diastolic blood pressure noted to be borderline today.  Patient admits that he does not limit salt in the foods. Patient Active Problem List   Diagnosis Date Noted  . Cortical age-related cataract of both eyes 10/09/2017  . Secondary esophageal varices without bleeding ([place]) 05/30/2017  . Prediabetes 02/26/2017  . Alcohol abuse 08/06/2016  . Vitamin D deficiency 12/06/2015  . Cirrhosis ([place]) 10/09/2014  . Hepatitis C virus infection cured after antiviral drug therapy 10/09/2014     Current Outpatient Medications on File Prior to Visit  Medication Sig Dispense Refill  . cholecalciferol (VITAMIN D) 1000 units tablet Take 1,000 Units by mouth daily.    . hydrocortisone (ANUSOL-HC) 2.5 % rectal cream APPLY TOPICALLY 2 TIMES DAILY  0  . loratadine (CLARITIN) 10 MG tablet TAKE 1 TABLET  10 MG TOTAL  BY MOUTH DAILY  1  . pantoprazole (PROTONIX) 40 MG tablet Take 1 tablet (40 mg total) by mouth daily. 90 tablet 2   No current facility-administered medications on file prior to visit.     No Known Allergies  Social History   Socioeconomic History  . Marital status: Single   Spouse name: Not on file  . Number of children: Not on file  . Years of education: Not on file  . Highest education level: Not on file  Occupational History  . Not on file  Social Needs  . Financial resource strain: Not on file  . Food insecurity:    Worry: Not on file    Inability: Not on file  . Transportation needs:    Medical: Not on file    Non-medical: Not on file  Tobacco Use  . Smoking status: Never Smoker  . Smokeless tobacco: Never Used  Substance and Sexual Activity  . Alcohol use: Yes    Alcohol/week: 1.2 oz    Types: 2 Cans of beer per week  . Drug use: No  . Sexual activity: Not on file  Lifestyle  . Physical activity:    Days per week: Not on file    Minutes per session: Not on file  . Stress: Not on file  Relationships  . Social connections:    Talks on phone: Not on file    Gets together: Not on file    Attends religious service: Not on file    Active member of club or organization: Not on file    Attends meetings of clubs or organizations: Not on file    Relationship status: Not on file  . Intimate partner violence:    Fear of current or ex partner: Not on  file    Emotionally abused: Not on file    Physically abused: Not on file    Forced sexual activity: Not on file  Other Topics Concern  . Not on file  Social History Narrative  . Not on file    Family History  Problem Relation Age of Onset  . Cancer Mother   . Brain cancer Mother   . Colon cancer Neg Hx   . Colon polyps Neg Hx   . Esophageal cancer Neg Hx   . Rectal cancer Neg Hx   . Stomach cancer Neg Hx     No past surgical history on file.  ROS: Review of Systems Negative except as stated above   PHYSICAL EXAM: BP 133/85   Pulse (!) 105   Temp 99.1 F (37.3 C) (Oral)   Resp 16   Wt 212 lb 12.8 oz (96.5 kg)   SpO2 97%   BMI 30.53 kg/m   Wt Readings from Last 3 Encounters:  10/09/17 212 lb 12.8 oz (96.5 kg)  05/30/17 220 lb 12.8 oz (100.2 kg)  05/08/17 211 lb (95.7 kg)     Repeat BP 116/84  Physical Exam General appearance - alert, well appearing, and in no distress Mental status - alert, oriented to person, place, and time, normal mood, behavior, speech, dress, motor activity, and thought processes Neck - supple, no significant adenopathy Chest - clear to auscultation, no wheezes, rales or rhonchi, symmetric air entry Heart - normal rate, regular rhythm, normal S1, S2, no murmurs, rubs, clicks or gallops Extremities - peripheral pulses normal, no pedal edema, no clubbing or cyanosis   ASSESSMENT AND PLAN: 1. Gastroesophageal reflux disease without esophagitis He has refill on Protonix.  Patient states that he was taking twice a day.  I advised cutting back to just once a day. 2. Cortical age-related cataract of both eyes No need for any surgical intervention at this time as it is not limiting him in his ADLs.  3. Elevated blood pressure reading [place] low-salt diet.  4. History of cirrhosis Will be due for liver ultrasound in August.  Encouraged him to continue to abstain from alcohol Patient was given the opportunity to ask questions.  Patient verbalized understanding of the plan and was able to repeat key elements of the plan.   No orders of the defined types were placed in this encounter.    Requested Prescriptions    No prescriptions requested or ordered in this encounter    Return in about 3 months (around 01/09/2018).  Karle Plumber, MD, FACP

## 2017-10-09 NOTE — Progress Notes (Signed)
Pt is requesting a 90 supply for medications

## 2017-11-10 MED FILL — NADOLOL 20 MG TAB: 20 | 30 days supply | Qty: 30 | Fill #1

## 2017-11-25 MED FILL — ?PANTOPRAZOLE SO DR 40MG TA: 40 | 30 days supply | Qty: 30 | Fill #0

## 2017-12-08 ENCOUNTER — Ambulatory Visit: Payer: Self-pay | Attending: Internal Medicine

## 2018-01-13 ENCOUNTER — Ambulatory Visit: Payer: Self-pay | Attending: Internal Medicine

## 2018-04-20 ENCOUNTER — Encounter: Payer: Self-pay | Admitting: Internal Medicine

## 2018-04-20 ENCOUNTER — Ambulatory Visit: Payer: Self-pay | Attending: Internal Medicine | Admitting: Internal Medicine

## 2018-04-20 VITALS — BP 152/92 | HR 73 | Temp 98.3°F | Resp 16 | Wt 200.2 lb

## 2018-04-20 DIAGNOSIS — J069 Acute upper respiratory infection, unspecified: Secondary | ICD-10-CM | POA: Insufficient documentation

## 2018-04-20 DIAGNOSIS — Z8619 Personal history of other infectious and parasitic diseases: Secondary | ICD-10-CM | POA: Insufficient documentation

## 2018-04-20 DIAGNOSIS — K7460 Unspecified cirrhosis of liver: Secondary | ICD-10-CM | POA: Insufficient documentation

## 2018-04-20 DIAGNOSIS — K219 Gastro-esophageal reflux disease without esophagitis: Secondary | ICD-10-CM | POA: Insufficient documentation

## 2018-04-20 DIAGNOSIS — Z79899 Other long term (current) drug therapy: Secondary | ICD-10-CM | POA: Insufficient documentation

## 2018-04-20 DIAGNOSIS — N529 Male erectile dysfunction, unspecified: Secondary | ICD-10-CM | POA: Insufficient documentation

## 2018-04-20 DIAGNOSIS — I10 Essential (primary) hypertension: Secondary | ICD-10-CM

## 2018-04-20 DIAGNOSIS — R030 Elevated blood-pressure reading, without diagnosis of hypertension: Secondary | ICD-10-CM | POA: Insufficient documentation

## 2018-04-20 DIAGNOSIS — R7303 Prediabetes: Secondary | ICD-10-CM | POA: Insufficient documentation

## 2018-04-20 DIAGNOSIS — E559 Vitamin D deficiency, unspecified: Secondary | ICD-10-CM | POA: Insufficient documentation

## 2018-04-20 DIAGNOSIS — Z808 Family history of malignant neoplasm of other organs or systems: Secondary | ICD-10-CM | POA: Insufficient documentation

## 2018-04-20 DIAGNOSIS — F1010 Alcohol abuse, uncomplicated: Secondary | ICD-10-CM | POA: Insufficient documentation

## 2018-04-20 DIAGNOSIS — Z23 Encounter for immunization: Secondary | ICD-10-CM | POA: Insufficient documentation

## 2018-04-20 MED ORDER — SILDENAFIL CITRATE 50 MG PO TABS: ORAL_TABLET | ORAL | 2 refills | Status: DC

## 2018-04-20 MED ORDER — NADOLOL 20 MG PO TABS: 20.0000 mg | ORAL_TABLET | Freq: Every day | ORAL | 2 refills | Status: DC

## 2018-04-20 MED FILL — NADOLOL 20 MG TAB: 20 | 30 days supply | Qty: 30 | Fill #0

## 2018-04-20 MED FILL — SILDENAFIL CITRATE 50 MG TA: 50 | 30 days supply | Qty: 10 | Fill #0 | Status: TO

## 2018-04-20 NOTE — Progress Notes (Signed)
Patient ID: Luis Morrison, male    DOB: 06/30/1955  MRN: [id number]  CC: Follow-up   Subjective: Luis Morrison is a 62 y.o. male who presents for chronic ds management. His concerns today include:  Hx Hep Cstatus post successful treatment with Harvoni,cirrhosis, GERD,Vit D def, ETOH abuse, preDM  C/o having a "bad cold for a wk."   Symptoms include wet cough, little SOB with cough, no fever.  + Sore throat.  Taking Nyquil and [place] Cough drop.  BP noted to be elevated again today.  "I put salt on everything."   Walking QOD for exercise.  Does okay with eating habits.  Loss 12 lbs since last visit. C/o problems getting and maintaining an erection.  With same partner for 18 yrs.  Reports the relationship is good.  Endorses good sexual desire.  Cirrhosis/cure of Hep C: Patient had a liver ultrasound in August of this year.  It revealed changes consistent with cirrhosis but no HCC.  He reports compliance with nadolol.   Patient Active Problem List   Diagnosis Date Noted  . Cortical age-related cataract of both eyes 10/09/2017  . Secondary esophageal varices without bleeding ([place]) 05/30/2017  . Prediabetes 02/26/2017  . Alcohol abuse 08/06/2016  . Vitamin D deficiency 12/06/2015  . Cirrhosis ([place]) 10/09/2014  . Hepatitis C virus infection cured after antiviral drug therapy 10/09/2014     Current Outpatient Medications on File Prior to Visit  Medication Sig Dispense Refill  . cholecalciferol (VITAMIN D) 1000 units tablet Take 1,000 Units by mouth daily.    . hydrocortisone (ANUSOL-HC) 2.5 % rectal cream APPLY TOPICALLY 2 TIMES DAILY  0  . loratadine (CLARITIN) 10 MG tablet TAKE 1 TABLET  10 MG TOTAL  BY MOUTH DAILY  1  . pantoprazole (PROTONIX) 40 MG tablet Take 1 tablet (40 mg total) by mouth daily. 90 tablet 2   No current facility-administered medications on file prior to visit.     No Known Allergies  Social History   Socioeconomic History  . Marital status: Single   Spouse name: Not on file  . Number of children: Not on file  . Years of education: Not on file  . Highest education level: Not on file  Occupational History  . Not on file  Social Needs  . Financial resource strain: Not on file  . Food insecurity:    Worry: Not on file    Inability: Not on file  . Transportation needs:    Medical: Not on file    Non-medical: Not on file  Tobacco Use  . Smoking status: Never Smoker  . Smokeless tobacco: Never Used  Substance and Sexual Activity  . Alcohol use: Yes    Alcohol/week: 2.0 standard drinks    Types: 2 Cans of beer per week  . Drug use: No  . Sexual activity: Not on file  Lifestyle  . Physical activity:    Days per week: Not on file    Minutes per session: Not on file  . Stress: Not on file  Relationships  . Social connections:    Talks on phone: Not on file    Gets together: Not on file    Attends religious service: Not on file    Active member of club or organization: Not on file    Attends meetings of clubs or organizations: Not on file    Relationship status: Not on file  . Intimate partner violence:    Fear of current or ex  partner: Not on file    Emotionally abused: Not on file    Physically abused: Not on file    Forced sexual activity: Not on file  Other Topics Concern  . Not on file  Social History Narrative  . Not on file    Family History  Problem Relation Age of Onset  . Cancer Mother   . Brain cancer Mother   . Colon cancer Neg Hx   . Colon polyps Neg Hx   . Esophageal cancer Neg Hx   . Rectal cancer Neg Hx   . Stomach cancer Neg Hx     No past surgical history on file.  ROS: Review of Systems Negative except as stated above PHYSICAL EXAM: BP (!) 152/92   Pulse 73   Temp 98.3 F (36.8 C) (Oral)   Resp 16   Wt 200 lb 3.2 oz (90.8 kg)   SpO2 97%   BMI 28.73 kg/m   Wt Readings from Last 3 Encounters:  04/20/18 200 lb 3.2 oz (90.8 kg)  10/09/17 212 lb 12.8 oz (96.5 kg)  05/30/17 220 lb 12.8  oz (100.2 kg)  Repeat BP 150/90 right, 150/80 left.  Physical Exam  General appearance - alert, well appearing, and in no distress Mental status - normal mood, behavior, speech, dress, motor activity, and thought processes Eyes -pink conjunctiva  nose - normal and patent, no erythema, discharge or polyps Mouth - mucous membranes moist, pharynx normal without lesions Neck - supple, no significant adenopathy Chest - clear to auscultation, no wheezes, rales or rhonchi, symmetric air entry Heart - normal rate, regular rhythm, normal S1, S2, no murmurs, rubs, clicks or gallops Extremities - peripheral pulses normal, no pedal edema, no clubbing or cyanosis   ASSESSMENT AND PLAN: 1. Upper respiratory tract infection, unspecified type Discussed conservatory measures.  Can use teaspoon of honey PRN cough.   Advised to stop over-the-counter cold remedies as they may be contributing to the increased blood pressure.  2. Elevated blood pressure reading DASH discussed and encouraged. Return in 2 wks to see clinical pharmacist for repeat blood pressure check.  If still elevated I recommend adding amlodipine. - CBC - Lipid panel - Comprehensive metabolic panel  3. Cirrhosis of liver without ascites, unspecified hepatic cirrhosis type ([facility]) - CBC - Lipid panel - Comprehensive metabolic panel - nadolol (CORGARD) 20 MG tablet; Take 1 tablet (20 mg total) by mouth daily.  Dispense: 90 tablet; Refill: 2  4. Erectile dysfunction, unspecified erectile dysfunction type Patient wanting to try Viagra.  I went over with him how the Viagra works. Pt advised of possible side effects of this medication including prolong erection, flushing, headaches, stuff nose and sudden vision and hearing changes.  Pt told to be seen in ER if he has erection lasting longer than 3-4 hrs, or if he has sudden vision changes or hearing loss. - sildenafil (VIAGRA) 50 MG tablet; 1 tab PO 1/2-1 hr prior to intercourse.  Limit use  to 1 tab/Q 24 hr  Dispense: 15 tablet; Refill: 2  5. Need for immunization against influenza - Flu Vaccine QUAD 36+ mos IM   Patient was given the opportunity to ask questions.  Patient verbalized understanding of the plan and was able to repeat key elements of the plan.   Orders Placed This Encounter  Procedures  . Flu Vaccine QUAD 36+ mos IM  . CBC  . Lipid panel  . Comprehensive metabolic panel     Requested Prescriptions   Signed  Prescriptions Disp Refills  . nadolol (CORGARD) 20 MG tablet 90 tablet 2    Sig: Take 1 tablet (20 mg total) by mouth daily.  . sildenafil (VIAGRA) 50 MG tablet 15 tablet 2    Sig: 1 tab PO 1/2-1 hr prior to intercourse.  Limit use to 1 tab/Q 24 hr    Return in about 3 months (around 07/21/2018).  Karle Plumber, MD, FACP

## 2018-04-20 NOTE — Patient Instructions (Addendum)
Please give patient an appointment with the clinical pharmacist in 2 weeks for repeat blood pressure check.  Please cut back on adding salt to the foods.    Influenza Virus Vaccine injection (Fluarix) What is this medicine? INFLUENZA VIRUS VACCINE (in floo EN zuh VAHY ruhs vak SEEN) helps to reduce the risk of getting influenza also known as the flu. This medicine may be used for other purposes; ask your health care provider or pharmacist if you have questions. COMMON BRAND NAME(S): Fluarix, Fluzone What should I tell my health care provider before I take this medicine? They need to know if you have any of these conditions: -bleeding disorder like hemophilia -fever or infection -Guillain-Barre syndrome or other neurological problems -immune system problems -infection with the human immunodeficiency virus (HIV) or AIDS -low blood platelet counts -multiple sclerosis -an unusual or allergic reaction to influenza virus vaccine, eggs, chicken proteins, latex, gentamicin, other medicines, foods, dyes or preservatives -pregnant or trying to get pregnant -breast-feeding How should I use this medicine? This vaccine is for injection into a muscle. It is given by a health care professional. A copy of Vaccine Information Statements will be given before each vaccination. Read this sheet carefully each time. The sheet may change frequently. Talk to your pediatrician regarding the use of this medicine in children. Special care may be needed. Overdosage: If you think you have taken too much of this medicine contact a poison control center or emergency room at once. NOTE: This medicine is only for you. Do not share this medicine with others. What if I miss a dose? This does not apply. What may interact with this medicine? -chemotherapy or radiation therapy -medicines that lower your immune system like etanercept, anakinra, infliximab, and adalimumab -medicines that treat or prevent blood clots like  warfarin -phenytoin -steroid medicines like prednisone or cortisone -theophylline -vaccines This list may not describe all possible interactions. Give your health care provider a list of all the medicines, herbs, non-prescription drugs, or dietary supplements you use. Also tell them if you smoke, drink alcohol, or use illegal drugs. Some items may interact with your medicine. What should I watch for while using this medicine? Report any side effects that do not go away within 3 days to your doctor or health care professional. Call your health care provider if any unusual symptoms occur within 6 weeks of receiving this vaccine. You may still catch the flu, but the illness is not usually as bad. You cannot get the flu from the vaccine. The vaccine will not protect against colds or other illnesses that may cause fever. The vaccine is needed every year. What side effects may I notice from receiving this medicine? Side effects that you should report to your doctor or health care professional as soon as possible: -allergic reactions like skin rash, itching or hives, swelling of the face, lips, or tongue Side effects that usually do not require medical attention (report to your doctor or health care professional if they continue or are bothersome): -fever -headache -muscle aches and pains -pain, tenderness, redness, or swelling at site where injected -weak or tired This list may not describe all possible side effects. Call your doctor for medical advice about side effects. You may report side effects to FDA at 1-800-FDA-1088. Where should I keep my medicine? This vaccine is only given in a clinic, pharmacy, doctor's office, or other health care setting and will not be stored at home. NOTE: This sheet is a summary. It may not cover  all possible information. If you have questions about this medicine, talk to your doctor, pharmacist, or health care provider.  2018 Elsevier/Gold Standard (2007-12-23  09:30:40)

## 2018-04-21 LAB — CBC
HEMATOCRIT: 49 % (ref 37.5–51.0)
HEMOGLOBIN: 16.3 g/dL (ref 13.0–17.7)
MCH: 27.7 pg (ref 26.6–33.0)
MCHC: 33.3 g/dL (ref 31.5–35.7)
MCV: 83 fL (ref 79–97)
Platelets: 80 10*3/uL — CL (ref 150–450)
RBC: 5.88 x10E6/uL — AB (ref 4.14–5.80)
RDW: 13.8 % (ref 12.3–15.4)
WBC: 5.1 10*3/uL (ref 3.4–10.8)

## 2018-04-21 LAB — COMPREHENSIVE METABOLIC PANEL
ALT: 27 IU/L (ref 0–44)
AST: 39 IU/L (ref 0–40)
Albumin/Globulin Ratio: 0.9 — ABNORMAL LOW (ref 1.2–2.2)
Albumin: 4.1 g/dL (ref 3.6–4.8)
Alkaline Phosphatase: 138 IU/L — ABNORMAL HIGH (ref 39–117)
BILIRUBIN TOTAL: 0.7 mg/dL (ref 0.0–1.2)
BUN/Creatinine Ratio: 9 — ABNORMAL LOW (ref 10–24)
BUN: 10 mg/dL (ref 8–27)
CHLORIDE: 104 mmol/L (ref 96–106)
CO2: 20 mmol/L (ref 20–29)
Calcium: 9.1 mg/dL (ref 8.6–10.2)
Creatinine, Ser: 1.06 mg/dL (ref 0.76–1.27)
GFR calc Af Amer: 85 mL/min/{1.73_m2} (ref >59)
GFR calc non Af Amer: 74 mL/min/{1.73_m2} (ref >59)
GLUCOSE: 81 mg/dL (ref 65–99)
Globulin, Total: 4.4 g/dL (ref 1.5–4.5)
Potassium: 3.7 mmol/L (ref 3.5–5.2)
Sodium: 141 mmol/L (ref 134–144)
TOTAL PROTEIN: 8.5 g/dL (ref 6.0–8.5)

## 2018-04-21 LAB — LIPID PANEL
CHOL/HDL RATIO: 2.7 ratio (ref 0.0–5.0)
Cholesterol, Total: 145 mg/dL (ref 100–199)
HDL: 53 mg/dL (ref >39)
LDL Calculated: 80 mg/dL (ref 0–99)
Triglycerides: 60 mg/dL (ref 0–149)
VLDL Cholesterol Cal: 12 mg/dL (ref 5–40)

## 2018-04-24 ENCOUNTER — Telehealth: Payer: Self-pay

## 2018-04-24 NOTE — Telephone Encounter (Signed)
Contacted pt to go over lab results. Contacted the home number per lady that answered phone pt doesn't live there anymore. Contacted the mobile number and was unable to reach pt and unable to lvm    If pt calls back please give results: cholesterol level is good. Blood count is nl except for low platelet count, This is not new and due to his history of cirrhosis. Platelets are the blood cells that help the blood clot normally. Kidney function is good. One of his liver enzyme is mildly elevated.

## 2018-05-04 ENCOUNTER — Encounter: Payer: Self-pay | Admitting: Pharmacist

## 2018-05-04 ENCOUNTER — Ambulatory Visit: Payer: Self-pay | Attending: Internal Medicine | Admitting: Pharmacist

## 2018-05-04 VITALS — BP 173/73 | HR 73

## 2018-05-04 DIAGNOSIS — I10 Essential (primary) hypertension: Secondary | ICD-10-CM | POA: Insufficient documentation

## 2018-05-04 NOTE — Progress Notes (Signed)
   S:    PCP: Dr. Wynetta Emery   Patient arrives in good spirits. Presents to the clinic for hypertension management. Patient was referred by Dr. Wynetta Emery on 04/20/18. BP at that visit 152/92. Pt without dx of HTN at that visit. Dr. Wynetta Emery recommended to start Norvasc if BP elevated today.   Today, pt denies chest pain, shortness of breath, headache, or blurred vision. Denies BLE edema.   Current BP Medications include: none  Dietary habits include: does not limit salt; goes through 2L of coke/week Exercise habits include: walks 1 hour daily Family / Social history: nothing pertinent to HTN/CVD, never smoker, drinks on the weekends and holidays  Home BP readings:  - 160s SBPs  - Does not get DBPs   O:  L arm after 5 minutes rest: 173/73, HR 73  Last 3 Office BP readings: BP Readings from Last 3 Encounters:  04/20/18 (!) 152/92  10/09/17 133/85  05/30/17 130/80   BMET    Component Value Date/Time   NA 141 04/20/2018 0905   K 3.7 04/20/2018 0905   CL 104 04/20/2018 0905   CO2 20 04/20/2018 0905   GLUCOSE 81 04/20/2018 0905   GLUCOSE 110 (H) 07/22/2016 0903   BUN 10 04/20/2018 0905   CREATININE 1.06 04/20/2018 0905   CREATININE 1.00 07/22/2016 0903   CALCIUM 9.1 04/20/2018 0905   GFRNONAA 74 04/20/2018 0905   GFRAA 85 04/20/2018 0905   Renal function: CrCl cannot be calculated (Unknown ideal weight.).  A/P: Hypertension currently uncontrolled and patient does not take anti-HTN medication. BP Goal <130/80 mmHg.   Explained patient's risk of clinical ASCVD given his recent BP elevations. Specifically, we spent a good amount of time discussing his risk of ACS and stroke given his degree of BP elevation. Even after this, pt refuses BP medications today. Encouraged him to make lifestyle changes and to f/u with his PCP.    -Pt refused anti-HTN medications.  -Counseled on lifestyle modifications for blood pressure control including reduced dietary sodium, increased exercise,  adequate sleep  Results reviewed and written information provided. Total time in face-to-face counseling 15 minutes.   F/U Clinic Visit in 05/29/18.    Patient seen with: Dixon Boos, PharmD Candidate [employer]  Benard Halsted, PharmD, [place] [phone]

## 2018-05-04 NOTE — Patient Instructions (Signed)
Thank you for coming to see us today.   Blood pressure today is elevated.   Limiting salt and caffeine, as well as exercising as able for at least 30 minutes for 5 days out of the week, can also help you lower your blood pressure.  Take your blood pressure at home if you are able. Please write down these numbers and bring them to your visits.  If you have any questions about medications, please call me [phone].  Luke.  

## 2018-05-13 MED FILL — ?PANTOPRAZOLE SO DR 40MG TA: 40 | 30 days supply | Qty: 30 | Fill #1

## 2018-05-28 ENCOUNTER — Ambulatory Visit: Payer: Self-pay | Admitting: Internal Medicine

## 2018-05-29 ENCOUNTER — Ambulatory Visit: Payer: Self-pay | Admitting: Internal Medicine

## 2018-06-12 ENCOUNTER — Other Ambulatory Visit: Payer: Self-pay | Admitting: Internal Medicine

## 2018-06-12 DIAGNOSIS — N529 Male erectile dysfunction, unspecified: Secondary | ICD-10-CM

## 2018-06-18 ENCOUNTER — Encounter (HOSPITAL_COMMUNITY): Payer: Self-pay | Admitting: Emergency Medicine

## 2018-06-18 ENCOUNTER — Emergency Department (HOSPITAL_COMMUNITY)
Admission: EM | Admit: 2018-06-18 | Discharge: 2018-06-18 | Disposition: A | Discharge status: Home / Self Care | Payer: Self-pay | Attending: Emergency Medicine | Admitting: Emergency Medicine

## 2018-06-18 ENCOUNTER — Ambulatory Visit: Payer: Self-pay | Attending: Internal Medicine | Admitting: Internal Medicine

## 2018-06-18 VITALS — BP 160/84 | HR 77 | Temp 98.7°F | Resp 16 | Ht 70.0 in | Wt 190.8 lb

## 2018-06-18 DIAGNOSIS — S00531A Contusion of lip, initial encounter: Secondary | ICD-10-CM

## 2018-06-18 DIAGNOSIS — Y999 Unspecified external cause status: Secondary | ICD-10-CM | POA: Insufficient documentation

## 2018-06-18 DIAGNOSIS — S0993XA Unspecified injury of face, initial encounter: Secondary | ICD-10-CM

## 2018-06-18 DIAGNOSIS — Y929 Unspecified place or not applicable: Secondary | ICD-10-CM | POA: Insufficient documentation

## 2018-06-18 DIAGNOSIS — Z5321 Procedure and treatment not carried out due to patient leaving prior to being seen by health care provider: Secondary | ICD-10-CM | POA: Insufficient documentation

## 2018-06-18 DIAGNOSIS — S01512A Laceration without foreign body of oral cavity, initial encounter: Secondary | ICD-10-CM | POA: Insufficient documentation

## 2018-06-18 DIAGNOSIS — Y939 Activity, unspecified: Secondary | ICD-10-CM | POA: Insufficient documentation

## 2018-06-18 MED ORDER — TRAMADOL HCL 50 MG PO TABS: ORAL_TABLET | ORAL | 0 refills | Status: DC

## 2018-06-18 MED FILL — traMADol HCL 50 MG TABS: 50 | 6 days supply | Qty: 40 | Fill #0

## 2018-06-18 NOTE — Patient Instructions (Signed)
Please ice your lips intermittently today.  Starting tomorrow you can use warm compresses.  I have given some tramadol for you to use as needed for pain.  The tramadol can cause drowsiness.  I would recommend a liquid diet until the swelling and pain decreases.
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
## 2018-06-18 NOTE — ED Notes (Signed)
Pt stating he felt better and will come back tomorrow
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
## 2018-07-10 ENCOUNTER — Ambulatory Visit: Payer: Self-pay | Admitting: Internal Medicine

## 2018-07-24 ENCOUNTER — Ambulatory Visit: Payer: Self-pay | Admitting: Internal Medicine

## 2018-08-11 ENCOUNTER — Other Ambulatory Visit: Payer: Self-pay | Admitting: Internal Medicine

## 2018-08-11 DIAGNOSIS — N529 Male erectile dysfunction, unspecified: Secondary | ICD-10-CM

## 2018-10-13 ENCOUNTER — Other Ambulatory Visit: Payer: Self-pay

## 2018-10-13 DIAGNOSIS — N529 Male erectile dysfunction, unspecified: Secondary | ICD-10-CM

## 2018-10-13 MED ORDER — SILDENAFIL CITRATE 50 MG PO TABS: ORAL_TABLET | ORAL | 2 refills | Status: DC

## 2018-12-04 ENCOUNTER — Telehealth: Payer: Self-pay | Admitting: Internal Medicine

## 2018-12-04 DIAGNOSIS — N529 Male erectile dysfunction, unspecified: Secondary | ICD-10-CM

## 2018-12-04 NOTE — Telephone Encounter (Signed)
New Message  1) Medication(s) Requested (by name): sildenafil (VIAGRA) 50 MG tablet  2) Pharmacy [facility] Choice: [facility] on [address]  3) Special Requests:   Approved medications will be sent to the pharmacy, we will reach out if there is an issue.  Requests made after 3pm may not be addressed until the following business day!  If a patient is unsure of the name of the medication(s) please note and ask patient to call back when they are able to provide all info, do not send to responsible party until all information is available!

## 2018-12-07 MED ORDER — SILDENAFIL CITRATE 50 MG PO TABS: ORAL_TABLET | ORAL | 2 refills | Status: DC

## 2018-12-19 ENCOUNTER — Inpatient Hospital Stay (HOSPITAL_COMMUNITY)
Admission: EM | Admit: 2018-12-19 | Discharge: 2018-12-21 | DRG: 964 | Disposition: A | Discharge status: Home / Self Care | Payer: Medicare Other | Attending: Physician Assistant | Admitting: Physician Assistant

## 2018-12-19 ENCOUNTER — Emergency Department (HOSPITAL_COMMUNITY): Payer: Medicare Other

## 2018-12-19 ENCOUNTER — Other Ambulatory Visit: Payer: Self-pay

## 2018-12-19 ENCOUNTER — Encounter (HOSPITAL_COMMUNITY): Payer: Self-pay | Admitting: *Deleted

## 2018-12-19 DIAGNOSIS — S2241XA Multiple fractures of ribs, right side, initial encounter for closed fracture: Secondary | ICD-10-CM | POA: Diagnosis present

## 2018-12-19 DIAGNOSIS — B182 Chronic viral hepatitis C: Secondary | ICD-10-CM | POA: Diagnosis present

## 2018-12-19 DIAGNOSIS — S270XXA Traumatic pneumothorax, initial encounter: Principal | ICD-10-CM | POA: Diagnosis present

## 2018-12-19 DIAGNOSIS — S2231XA Fracture of one rib, right side, initial encounter for closed fracture: Secondary | ICD-10-CM

## 2018-12-19 DIAGNOSIS — B1920 Unspecified viral hepatitis C without hepatic coma: Secondary | ICD-10-CM

## 2018-12-19 DIAGNOSIS — S36113A Laceration of liver, unspecified degree, initial encounter: Secondary | ICD-10-CM | POA: Diagnosis present

## 2018-12-19 DIAGNOSIS — S01312A Laceration without foreign body of left ear, initial encounter: Secondary | ICD-10-CM | POA: Diagnosis present

## 2018-12-19 DIAGNOSIS — J939 Pneumothorax, unspecified: Secondary | ICD-10-CM

## 2018-12-19 DIAGNOSIS — Z808 Family history of malignant neoplasm of other organs or systems: Secondary | ICD-10-CM

## 2018-12-19 DIAGNOSIS — R7303 Prediabetes: Secondary | ICD-10-CM | POA: Diagnosis present

## 2018-12-19 DIAGNOSIS — D696 Thrombocytopenia, unspecified: Secondary | ICD-10-CM | POA: Diagnosis present

## 2018-12-19 DIAGNOSIS — Z1159 Encounter for screening for other viral diseases: Secondary | ICD-10-CM

## 2018-12-19 DIAGNOSIS — F1010 Alcohol abuse, uncomplicated: Secondary | ICD-10-CM | POA: Diagnosis present

## 2018-12-19 DIAGNOSIS — S27321A Contusion of lung, unilateral, initial encounter: Secondary | ICD-10-CM | POA: Diagnosis present

## 2018-12-19 DIAGNOSIS — K7460 Unspecified cirrhosis of liver: Secondary | ICD-10-CM | POA: Diagnosis present

## 2018-12-19 DIAGNOSIS — Z23 Encounter for immunization: Secondary | ICD-10-CM
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
## 2018-12-20 ENCOUNTER — Emergency Department (HOSPITAL_COMMUNITY): Payer: Medicare Other

## 2018-12-20 ENCOUNTER — Encounter (HOSPITAL_COMMUNITY): Payer: Self-pay | Admitting: Surgery

## 2018-12-20 ENCOUNTER — Other Ambulatory Visit: Payer: Self-pay

## 2018-12-20 DIAGNOSIS — S27321A Contusion of lung, unilateral, initial encounter: Secondary | ICD-10-CM | POA: Diagnosis present

## 2018-12-20 DIAGNOSIS — S270XXA Traumatic pneumothorax, initial encounter: Secondary | ICD-10-CM | POA: Diagnosis present

## 2018-12-20 DIAGNOSIS — R7303 Prediabetes: Secondary | ICD-10-CM | POA: Diagnosis present

## 2018-12-20 DIAGNOSIS — B1920 Unspecified viral hepatitis C without hepatic coma: Secondary | ICD-10-CM

## 2018-12-20 DIAGNOSIS — K7469 Other cirrhosis of liver: Secondary | ICD-10-CM

## 2018-12-20 DIAGNOSIS — Z23 Encounter for immunization: Secondary | ICD-10-CM | POA: Diagnosis not present

## 2018-12-20 DIAGNOSIS — S2241XA Multiple fractures of ribs, right side, initial encounter for closed fracture: Secondary | ICD-10-CM | POA: Diagnosis present

## 2018-12-20 DIAGNOSIS — B182 Chronic viral hepatitis C: Secondary | ICD-10-CM | POA: Diagnosis present

## 2018-12-20 DIAGNOSIS — S01312A Laceration without foreign body of left ear, initial encounter: Secondary | ICD-10-CM | POA: Diagnosis present

## 2018-12-20 DIAGNOSIS — D696 Thrombocytopenia, unspecified: Secondary | ICD-10-CM

## 2018-12-20 DIAGNOSIS — K7460 Unspecified cirrhosis of liver: Secondary | ICD-10-CM | POA: Diagnosis present

## 2018-12-20 DIAGNOSIS — F1010 Alcohol abuse, uncomplicated: Secondary | ICD-10-CM | POA: Diagnosis present

## 2018-12-20 DIAGNOSIS — J939 Pneumothorax, unspecified: Secondary | ICD-10-CM

## 2018-12-20 DIAGNOSIS — Z808 Family history of malignant neoplasm of other organs or systems: Secondary | ICD-10-CM | POA: Diagnosis not present

## 2018-12-20 DIAGNOSIS — S36113A Laceration of liver, unspecified degree, initial encounter: Secondary | ICD-10-CM | POA: Diagnosis present

## 2018-12-20 DIAGNOSIS — Z1159 Encounter for screening for other viral diseases: Secondary | ICD-10-CM | POA: Diagnosis not present

## 2018-12-20 LAB — URINALYSIS, ROUTINE W REFLEX MICROSCOPIC
Bacteria, UA: NONE SEEN
Bilirubin Urine: NEGATIVE
Glucose, UA: NEGATIVE mg/dL
Ketones, ur: NEGATIVE mg/dL
Leukocytes,Ua: NEGATIVE
Nitrite: NEGATIVE
Protein, ur: 30 mg/dL — AB
Specific Gravity, Urine: 1.013 (ref 1.005–1.030)
pH: 6 (ref 5.0–8.0)

## 2018-12-20 LAB — COMPREHENSIVE METABOLIC PANEL
ALT: 44 U/L (ref 0–44)
AST: 74 U/L — ABNORMAL HIGH (ref 15–41)
Albumin: 3.6 g/dL (ref 3.5–5.0)
Alkaline Phosphatase: 123 U/L (ref 38–126)
Anion gap: 10 (ref 5–15)
BUN: 8 mg/dL (ref 8–23)
CO2: 21 mmol/L — ABNORMAL LOW (ref 22–32)
Calcium: 8.7 mg/dL — ABNORMAL LOW (ref 8.9–10.3)
Chloride: 107 mmol/L (ref 98–111)
Creatinine, Ser: 1.37 mg/dL — ABNORMAL HIGH (ref 0.61–1.24)
GFR calc Af Amer: >60 mL/min (ref >60)
GFR calc non Af Amer: 54 mL/min — ABNORMAL LOW (ref >60)
Glucose, Bld: 126 mg/dL — ABNORMAL HIGH (ref 70–99)
Potassium: 3.6 mmol/L (ref 3.5–5.1)
Sodium: 138 mmol/L (ref 135–145)
Total Bilirubin: 1.2 mg/dL (ref 0.3–1.2)
Total Protein: 8.1 g/dL (ref 6.5–8.1)

## 2018-12-20 LAB — CBC
HCT: 47 % (ref 39.0–52.0)
Hemoglobin: 15.6 g/dL (ref 13.0–17.0)
MCH: 29.5 pg (ref 26.0–34.0)
MCHC: 33.2 g/dL (ref 30.0–36.0)
MCV: 89 fL (ref 80.0–100.0)
Platelets: 63 10*3/uL — ABNORMAL LOW (ref 150–400)
RBC: 5.28 MIL/uL (ref 4.22–5.81)
RDW: 14.4 % (ref 11.5–15.5)
WBC: 9.3 10*3/uL (ref 4.0–10.5)
nRBC: 0 % (ref 0.0–0.2)

## 2018-12-20 LAB — I-STAT CHEM 8, ED
BUN: 10 mg/dL (ref 8–23)
Calcium, Ion: 1.09 mmol/L — ABNORMAL LOW (ref 1.15–1.40)
Chloride: 107 mmol/L (ref 98–111)
Creatinine, Ser: 1.5 mg/dL — ABNORMAL HIGH (ref 0.61–1.24)
Glucose, Bld: 123 mg/dL — ABNORMAL HIGH (ref 70–99)
HCT: 49 % (ref 39.0–52.0)
Hemoglobin: 16.7 g/dL (ref 13.0–17.0)
Potassium: 3.6 mmol/L (ref 3.5–5.1)
Sodium: 142 mmol/L (ref 135–145)
TCO2: 25 mmol/L (ref 22–32)

## 2018-12-20 LAB — SARS CORONAVIRUS 2 BY RT PCR (HOSPITAL ORDER, PERFORMED IN ~~LOC~~ HOSPITAL LAB): SARS Coronavirus 2: NEGATIVE

## 2018-12-20 LAB — SAMPLE TO BLOOD BANK

## 2018-12-20 LAB — PROTIME-INR
INR: 1.2 (ref 0.8–1.2)
Prothrombin Time: 15 seconds (ref 11.4–15.2)

## 2018-12-20 LAB — ETHANOL: Alcohol, Ethyl (B): 172 mg/dL — ABNORMAL HIGH (ref <10)

## 2018-12-20 LAB — CDS SEROLOGY

## 2018-12-20 LAB — LACTIC ACID, PLASMA: Lactic Acid, Venous: 1.9 mmol/L (ref 0.5–1.9)

## 2018-12-20 MED ORDER — ONDANSETRON 4 MG PO TBDP: 4.0000 mg | ORAL_TABLET | Freq: Four times a day (QID) | ORAL | Status: DC | PRN

## 2018-12-20 MED ORDER — ALUM & MAG HYDROXIDE-SIMETH 200-200-20 MG/5ML PO SUSP: 30.0000 mL | Freq: Four times a day (QID) | ORAL | Status: DC | PRN

## 2018-12-20 MED ORDER — HYDROMORPHONE HCL 1 MG/ML IJ SOLN: 1.0000 mg | INTRAMUSCULAR | Status: DC | PRN

## 2018-12-20 MED ORDER — GABAPENTIN 300 MG PO CAPS
300.0000 mg | ORAL_CAPSULE | Freq: Three times a day (TID) | ORAL | Status: DC
  Administered 2018-12-20: 300 mg via ORAL
  Filled 2018-12-20: qty 1

## 2018-12-20 MED ORDER — LACTULOSE 10 GM/15ML PO SOLN: 20.0000 g | Freq: Two times a day (BID) | ORAL | Status: DC | PRN

## 2018-12-20 MED ORDER — ACETAMINOPHEN 325 MG PO TABS: 650.0000 mg | ORAL_TABLET | ORAL | Status: DC | PRN

## 2018-12-20 MED ORDER — MENTHOL 3 MG MT LOZG: 1.0000 | LOZENGE | OROMUCOSAL | Status: DC | PRN

## 2018-12-20 MED ORDER — SODIUM CHLORIDE 0.9% FLUSH: 3.0000 mL | INTRAVENOUS | Status: DC | PRN

## 2018-12-20 MED ORDER — TETANUS-DIPHTH-ACELL PERTUSSIS 5-2.5-18.5 LF-MCG/0.5 IM SUSP
0.5000 mL | Freq: Once | INTRAMUSCULAR | Status: AC
  Administered 2018-12-20: 0.5 mL via INTRAMUSCULAR
  Filled 2018-12-20: qty 0.5

## 2018-12-20 MED ORDER — PHENOL 1.4 % MT LIQD: 1.0000 | OROMUCOSAL | Status: DC | PRN

## 2018-12-20 MED ORDER — MAGIC MOUTHWASH: 15.0000 mL | Freq: Four times a day (QID) | ORAL | Status: DC | PRN

## 2018-12-20 MED ORDER — GABAPENTIN 600 MG PO TABS: 300.0000 mg | ORAL_TABLET | Freq: Three times a day (TID) | ORAL | Status: DC

## 2018-12-20 MED ORDER — HYDROCORTISONE 1 % EX CREA: 1.0000 "application " | TOPICAL_CREAM | Freq: Three times a day (TID) | CUTANEOUS | Status: DC | PRN

## 2018-12-20 MED ORDER — METHOCARBAMOL 1000 MG/10ML IJ SOLN: 1000.0000 mg | Freq: Three times a day (TID) | INTRAVENOUS | Status: DC | PRN

## 2018-12-20 MED ORDER — METHOCARBAMOL 500 MG PO TABS
1000.0000 mg | ORAL_TABLET | Freq: Four times a day (QID) | ORAL | Status: DC
  Administered 2018-12-20 – 2018-12-21 (×4): 1000 mg via ORAL
  Filled 2018-12-20 (×4): qty 2

## 2018-12-20 MED ORDER — LIP MEDEX EX OINT
1.0000 "application " | TOPICAL_OINTMENT | Freq: Two times a day (BID) | CUTANEOUS | Status: DC
  Administered 2018-12-20 (×2): 1 via TOPICAL
  Filled 2018-12-20: qty 7

## 2018-12-20 MED ORDER — FENTANYL CITRATE (PF) 100 MCG/2ML IJ SOLN
50.0000 ug | Freq: Once | INTRAMUSCULAR | Status: AC
  Administered 2018-12-20: 50 ug via INTRAVENOUS
  Filled 2018-12-20: qty 2

## 2018-12-20 MED ORDER — PROCHLORPERAZINE EDISYLATE 10 MG/2ML IJ SOLN: 5.0000 mg | INTRAMUSCULAR | Status: DC | PRN

## 2018-12-20 MED ORDER — LACTATED RINGERS IV SOLN
INTRAVENOUS | Status: AC
  Administered 2018-12-20: 01:00:00 via INTRAVENOUS

## 2018-12-20 MED ORDER — HYDRALAZINE HCL 20 MG/ML IJ SOLN: 10.0000 mg | INTRAMUSCULAR | Status: DC | PRN

## 2018-12-20 MED ORDER — PSYLLIUM 95 % PO PACK
1.0000 | PACK | Freq: Every day | ORAL | Status: DC
  Administered 2018-12-20: 13:00:00 1 via ORAL
  Filled 2018-12-20: qty 1

## 2018-12-20 MED ORDER — ONDANSETRON HCL 4 MG/2ML IJ SOLN: 4.0000 mg | Freq: Four times a day (QID) | INTRAMUSCULAR | Status: DC | PRN

## 2018-12-20 MED ORDER — HYDROCORTISONE (PERIANAL) 2.5 % EX CREA: 1.0000 "application " | TOPICAL_CREAM | Freq: Four times a day (QID) | CUTANEOUS | Status: DC | PRN

## 2018-12-20 MED ORDER — GUAIFENESIN-DM 100-10 MG/5ML PO SYRP: 10.0000 mL | ORAL_SOLUTION | ORAL | Status: DC | PRN

## 2018-12-20 MED ORDER — OXYCODONE HCL 5 MG PO TABS
5.0000 mg | ORAL_TABLET | ORAL | Status: DC | PRN
  Administered 2018-12-21: 5 mg via ORAL
  Filled 2018-12-20: qty 1

## 2018-12-20 MED ORDER — BISACODYL 10 MG RE SUPP: 10.0000 mg | Freq: Two times a day (BID) | RECTAL | Status: DC | PRN

## 2018-12-20 MED ORDER — GABAPENTIN 400 MG PO CAPS
400.0000 mg | ORAL_CAPSULE | Freq: Three times a day (TID) | ORAL | Status: DC
  Administered 2018-12-20 – 2018-12-21 (×3): 400 mg via ORAL
  Filled 2018-12-20 (×3): qty 1

## 2018-12-20 MED ORDER — IOHEXOL 300 MG/ML  SOLN
100.0000 mL | Freq: Once | INTRAMUSCULAR | Status: AC | PRN
  Administered 2018-12-20: 100 mL via INTRAVENOUS

## 2018-12-20 MED ORDER — SODIUM CHLORIDE 0.9% FLUSH
3.0000 mL | Freq: Two times a day (BID) | INTRAVENOUS | Status: DC
  Administered 2018-12-20 (×2): 3 mL via INTRAVENOUS

## 2018-12-20 MED ORDER — POTASSIUM CHLORIDE IN NACL 20-0.9 MEQ/L-% IV SOLN
INTRAVENOUS | Status: DC
  Administered 2018-12-20: 04:00:00 via INTRAVENOUS
  Filled 2018-12-20: qty 1000

## 2018-12-20 MED ORDER — OXYCODONE HCL 5 MG PO TABS
10.0000 mg | ORAL_TABLET | ORAL | Status: DC | PRN
  Administered 2018-12-20 (×4): 10 mg via ORAL
  Filled 2018-12-20 (×5): qty 2

## 2018-12-20 MED ORDER — ALBUMIN HUMAN 5 % IV SOLN: 12.5000 g | Freq: Four times a day (QID) | INTRAVENOUS | Status: DC | PRN

## 2018-12-20 MED ORDER — SODIUM CHLORIDE 0.9 % IV SOLN: 250.0000 mL | INTRAVENOUS | Status: DC | PRN
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
## 2018-12-20 NOTE — ED Notes (Signed)
Patient transported to CT 
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
## 2018-12-21 ENCOUNTER — Inpatient Hospital Stay (HOSPITAL_COMMUNITY): Payer: Medicare Other

## 2018-12-21 LAB — COMPREHENSIVE METABOLIC PANEL
ALT: 34 U/L (ref 0–44)
AST: 51 U/L — ABNORMAL HIGH (ref 15–41)
Albumin: 2.9 g/dL — ABNORMAL LOW (ref 3.5–5.0)
Alkaline Phosphatase: 109 U/L (ref 38–126)
Anion gap: 8 (ref 5–15)
BUN: 10 mg/dL (ref 8–23)
CO2: 24 mmol/L (ref 22–32)
Calcium: 8.3 mg/dL — ABNORMAL LOW (ref 8.9–10.3)
Chloride: 102 mmol/L (ref 98–111)
Creatinine, Ser: 1.03 mg/dL (ref 0.61–1.24)
GFR calc Af Amer: >60 mL/min (ref >60)
GFR calc non Af Amer: >60 mL/min (ref >60)
Glucose, Bld: 125 mg/dL — ABNORMAL HIGH (ref 70–99)
Potassium: 3.7 mmol/L (ref 3.5–5.1)
Sodium: 134 mmol/L — ABNORMAL LOW (ref 135–145)
Total Bilirubin: 1 mg/dL (ref 0.3–1.2)
Total Protein: 7 g/dL (ref 6.5–8.1)

## 2018-12-21 LAB — CBC
HCT: 42.5 % (ref 39.0–52.0)
Hemoglobin: 13.8 g/dL (ref 13.0–17.0)
MCH: 29.2 pg (ref 26.0–34.0)
MCHC: 32.5 g/dL (ref 30.0–36.0)
MCV: 89.9 fL (ref 80.0–100.0)
Platelets: 47 10*3/uL — ABNORMAL LOW (ref 150–400)
RBC: 4.73 MIL/uL (ref 4.22–5.81)
RDW: 14.5 % (ref 11.5–15.5)
WBC: 6.6 10*3/uL (ref 4.0–10.5)
nRBC: 0 % (ref 0.0–0.2)

## 2018-12-21 LAB — HIV ANTIBODY (ROUTINE TESTING W REFLEX): HIV Screen 4th Generation wRfx: NONREACTIVE

## 2018-12-21 MED ORDER — METHOCARBAMOL 500 MG PO TABS: 1000.0000 mg | ORAL_TABLET | Freq: Four times a day (QID) | ORAL | 1 refills | Status: DC

## 2018-12-21 MED ORDER — OXYCODONE HCL 10 MG PO TABS: 10.0000 mg | ORAL_TABLET | ORAL | 0 refills | Status: DC | PRN

## 2018-12-21 MED ORDER — ACETAMINOPHEN 325 MG PO TABS: 650.0000 mg | ORAL_TABLET | Freq: Four times a day (QID) | ORAL | Status: DC | PRN

## 2018-12-21 MED ORDER — GABAPENTIN 400 MG PO CAPS: 400.0000 mg | ORAL_CAPSULE | Freq: Three times a day (TID) | ORAL | 0 refills | Status: DC
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
## 2018-12-21 NOTE — Progress Notes (Signed)
[name] to be D/C'd  per MD order. Discussed with the patient and all questions fully answered.  VSS, Skin clean, dry and intact without evidence of skin break down, no evidence of skin tears noted.  IV catheter discontinued intact. Site without signs and symptoms of complications. Dressing and pressure applied.  An After Visit Summary was printed and given to the patient. Patient received prescription.  D/c education completed with patient/family including follow up instructions, medication list, d/c activities limitations if indicated, with other d/c instructions as indicated by MD - patient able to verbalize understanding, all questions fully answered.   Patient instructed to return to ED, call 911, or call MD for any changes in condition.   Patient to be escorted via [place], and D/C home via private auto.

## 2018-12-21 NOTE — Discharge Instructions (Signed)
Rib Fracture  A rib fracture is a break or crack in one of the bones of the ribs. The ribs are like a cage that goes around your upper chest. A broken or cracked rib is often painful, but most do not cause other problems. Most rib fractures usually heal on their own in 1-3 months. Follow these instructions at home: Managing pain, stiffness, and swelling  If directed, apply ice to the injured area. ? Put ice in a plastic bag. ? Place a towel between your skin and the bag. ? Leave the ice on for 20 minutes, 2-3 times a day.  Take over-the-counter and prescription medicines only as told by your doctor. Activity  Avoid activities that cause pain to the injured area. Protect your injured area.  Slowly increase activity as told by your doctor. General instructions  Do deep breathing as told by your doctor. You may be told to: ? Take deep breaths many times a day. ? Cough many times a day while hugging a pillow. ? Use a device (incentive spirometer) to do deep breathing many times a day.  Drink enough fluid to keep your pee (urine) clear or pale yellow.  Do not wear a rib belt or binder. These do not allow you to breathe deeply.  Keep all follow-up visits as told by your doctor. This is important. Contact a doctor if:  You have a fever. Get help right away if:  You have trouble breathing.  You are short of breath.  You cannot stop coughing.  You cough up thick or bloody spit (sputum).  You feel sick to your stomach (nauseous), throw up (vomit), or have belly (abdominal) pain.  Your pain gets worse and medicine does not help. Summary  A rib fracture is a break or crack in one of the bones of the ribs.  Apply ice to the injured area and take medicines for pain as told by your doctor.  Take deep breaths and cough many times a day. Hug a pillow every time you cough. This information is not intended to replace advice given to you by your health care provider. Make sure you  discuss any questions you have with your health care provider. Document Released: 03/05/2008 Document Revised: 05/09/2017 Document Reviewed: 08/27/2016 Elsevier Patient Education  [place].   Pneumothorax A pneumothorax is commonly called a collapsed lung. It is a condition in which air leaks from a lung and builds up between the thin layer of tissue that covers the lungs (visceral pleura) and the interior wall of the chest cavity (parietal pleura). The air gets trapped outside the lung, between the lung and the chest wall (pleural space). The air takes up space and prevents the lung from fully expanding. This condition sometimes occurs suddenly with no apparent cause. The buildup of air may be small or large. A small pneumothorax may go away on its own. A large pneumothorax will require treatment and hospitalization. What are the causes? This condition may be caused by:  Trauma and injury to the chest wall.  Surgery and other medical procedures.  A complication of an underlying lung problem, especially chronic obstructive pulmonary disease (COPD) or emphysema. Sometimes the cause of this condition is not known. What increases the risk? You are more likely to develop this condition if:  You have an underlying lung problem.  You smoke.  You are 20-59 years old, male, tall, and underweight.  You have a personal or family history of pneumothorax.  You have  an eating disorder (anorexia nervosa). This condition can also happen quickly, even in people with no history of lung problems. What are the signs or symptoms? Sometimes a pneumothorax will have no symptoms. When symptoms are present, they can include:  Chest pain.  Shortness of breath.  Increased rate of breathing.  Bluish color to your lips or skin (cyanosis). How is this diagnosed? This condition may be diagnosed by:  A medical history and physical exam.  A chest X-ray, chest CT scan, or ultrasound. How is  this treated? Treatment depends on how severe your condition is. The goal of treatment is to remove the extra air and allow your lung to expand back to its normal size.  For a small pneumothorax: ? No treatment may be needed. ? Extra oxygen is sometimes used to make it go away more quickly.  For a large pneumothorax or a pneumothorax that is causing symptoms, a procedure is done to drain the air from your lungs. To do this, a health care provider may use: ? A needle with a syringe. This is used to suck air from a pleural space where no additional leakage is taking place. ? A chest tube. This is used to suck air where there is ongoing leakage into the pleural space. The chest tube may need to remain in place for several days until the air leak has healed.  In more severe cases, surgery may be needed to repair the damage that is causing the leak.  If you have multiple pneumothorax episodes or have an air leak that will not heal, a procedure called a pleurodesis may be done. A medicine is placed in the pleural space to irritate the tissues around the lung so that the lung will stick to the chest wall, seal any leaks, and stop any buildup of air in that space. If you have an underlying lung problem, severe symptoms, or a large pneumothorax you will usually need to stay in the hospital. Follow these instructions at home: Lifestyle  Do not use any products that contain nicotine or tobacco, such as cigarettes and e-cigarettes. These are major risk factors in pneumothorax. If you need help quitting, ask your health care provider.  Do not lift anything that is heavier than 10 lb (4.5 kg), or the limit that your health care provider tells you, until he or she says that it is safe.  Avoid activities that take a lot of effort (strenuous) for as long as told by your health care provider.  Return to your normal activities as told by your health care provider. Ask your health care provider what activities  are safe for you.  Do not fly in an airplane or scuba dive until your health care provider says it is okay. General instructions  Take over-the-counter and prescription medicines only as told by your health care provider.  If a cough or pain makes it difficult for you to sleep at night, try sleeping in a semi-upright position in a recliner or by using 2 or 3 pillows.  If you had a chest tube and it was removed, ask your health care provider when you can remove the bandage (dressing). While the dressing is in place, do not allow it to get wet.  Keep all follow-up visits as told by your health care provider. This is important. Contact a health care provider if:  You cough up thick mucus (sputum) that is yellow or green in color.  You were treated with a chest tube, and  you have redness, increasing pain, or discharge at the site where it was placed. Get help right away if:  You have increasing chest pain or shortness of breath.  You have a cough that will not go away.  You begin coughing up blood.  You have pain that is getting worse or is not controlled with medicines.  The site where your chest tube was located opens up.  You feel air coming out of the site where the chest tube was placed.  You have a fever or persistent symptoms for more than 2-3 days.  You have a fever and your symptoms suddenly get worse. These symptoms may represent a serious problem that is an emergency. Do not wait to see if the symptoms will go away. Get medical help right away. Call your local emergency services (911 in the [place] Do not drive yourself to the hospital. Summary  A pneumothorax, commonly called a collapsed lung, is a condition in which air leaks from a lung and gets trapped between the lung and the chest wall (pleural space).  The buildup of air may be small or large. A small pneumothorax may go away on its own. A large pneumothorax will require treatment and hospitalization.  Treatment  for this condition depends on how severe the pneumothorax is. The goal of treatment is to remove the extra air and allow the lung to expand back to its normal size. This information is not intended to replace advice given to you by your health care provider. Make sure you discuss any questions you have with your health care provider. Document Released: 05/27/2005 Document Revised: 05/09/2017 Document Reviewed: 05/05/2017 Elsevier Patient Education  2020 [employer].

## 2019-01-18 ENCOUNTER — Telehealth: Payer: Self-pay | Admitting: Internal Medicine

## 2019-01-18 NOTE — Telephone Encounter (Signed)
Attempted to reach patient but unable to reach at number listed. operator states Number is invalid

## 2019-01-18 NOTE — Telephone Encounter (Signed)
New Message   1) Medication(s) Requested (by name): Viagra  2) Pharmacy of Choice: [facility] on [address]  3) Special Requests:   Approved medications will be sent to the pharmacy, we will reach out if there is an issue.  Requests made after 3pm may not be addressed until the following business day!  If a patient is unsure of the name of the medication(s) please note and ask patient to call back when they are able to provide all info, do not send to responsible party until all information is available!

## 2019-01-20 ENCOUNTER — Other Ambulatory Visit: Payer: Self-pay

## 2019-01-20 ENCOUNTER — Ambulatory Visit: Payer: Medicare Other | Attending: Internal Medicine | Admitting: Physician Assistant

## 2019-01-20 VITALS — BP 133/72 | HR 85 | Temp 98.8°F | Wt 187.0 lb

## 2019-01-20 DIAGNOSIS — D696 Thrombocytopenia, unspecified: Secondary | ICD-10-CM

## 2019-01-20 DIAGNOSIS — R899 Unspecified abnormal finding in specimens from other organs, systems and tissues: Secondary | ICD-10-CM | POA: Diagnosis not present

## 2019-01-20 DIAGNOSIS — G4700 Insomnia, unspecified: Secondary | ICD-10-CM | POA: Diagnosis not present

## 2019-01-20 DIAGNOSIS — S2241XA Multiple fractures of ribs, right side, initial encounter for closed fracture: Secondary | ICD-10-CM

## 2019-01-20 DIAGNOSIS — Z09 Encounter for follow-up examination after completed treatment for conditions other than malignant neoplasm: Secondary | ICD-10-CM

## 2019-01-20 DIAGNOSIS — N529 Male erectile dysfunction, unspecified: Secondary | ICD-10-CM

## 2019-01-20 MED ORDER — SILDENAFIL CITRATE 50 MG PO TABS: 50.0000 mg | ORAL_TABLET | Freq: Every day | ORAL | 4 refills | Status: DC | PRN

## 2019-01-20 MED ORDER — PREDNISONE 10 MG PO TABS: ORAL_TABLET | ORAL | 0 refills | Status: DC

## 2019-01-20 MED ORDER — TRAZODONE HCL 50 MG PO TABS: 25.0000 mg | ORAL_TABLET | Freq: Every evening | ORAL | 0 refills | Status: DC | PRN

## 2019-01-20 MED ORDER — SILDENAFIL CITRATE 50 MG PO TABS: 50.0000 mg | ORAL_TABLET | Freq: Every day | ORAL | 0 refills | Status: DC | PRN

## 2019-01-20 NOTE — Progress Notes (Signed)
Pt. Is asking for a stronger pain medication for his rib pain.  Pt. Stated the medication he is on is not helping him with his pain.   Pt. Is asking for Viagra refills.

## 2019-01-20 NOTE — Patient Instructions (Signed)
Avoid alcohol.  [email or web address]

## 2019-01-20 NOTE — Progress Notes (Signed)
Patient ID: Luis Morrison, male   DOB: [birth year], 63 y.o.   MRN: [id number]   Asser Lucena, is a 63 y.o. male  [id number]  NKN:[id number]  DOB - February 04, 1956  Subjective:  Chief Complaint and HPI: Luis Morrison is a 63 y.o. male here today for a follow up visit after being seen in the ED about 1 month ago for multiple rib fractures.  Says he is still having a lot of pain on the right side of his sleep and having trouble sleeping at night.  Taking gabapentin and methocarbamol for pain with minimal help/relief.  Asking for something stronger  Wants Viagra RF.  No CP/SOB.     ED/Hospital notes reviewed.    ROS:   Constitutional:  No f/c, No night sweats, No unexplained weight loss. EENT:  No vision changes, No blurry vision, No hearing changes. No mouth, throat, or ear problems.  Respiratory: No cough, No SOB Cardiac: No CP, no palpitations GI:  No abd pain, No N/V/D. GU: No Urinary s/sx Musculoskeletal: + rib pain Neuro: No headache, no dizziness, no motor weakness.  Skin: No rash Endocrine:  No polydipsia. No polyuria.  Psych: Denies SI/HI  No problems updated.  ALLERGIES: No Known Allergies  PAST MEDICAL HISTORY: Past Medical History:  Diagnosis Date  . Cirrhosis ([place]) 10/09/2014    MEDICATIONS AT HOME: Prior to Admission medications   Medication Sig Start Date End Date Taking? Authorizing Provider  acetaminophen (TYLENOL) 325 MG tablet Take 2 tablets (650 mg total) by mouth every 6 (six) hours as needed for mild pain or fever. 12/21/18  Yes Rayburn, Floyce Stakes, PA-C  methocarbamol (ROBAXIN) 500 MG tablet Take 2 tablets (1,000 mg total) by mouth 4 (four) times daily. 12/21/18  Yes Rayburn, Floyce Stakes, PA-C  predniSONE (DELTASONE) 10 MG tablet 6,5,4,3,2,1 take each days dose in the morning with food 01/20/19   Freeman Caldron M, PA-C  sildenafil (VIAGRA) 50 MG tablet Take 1 tablet (50 mg total) by mouth daily as needed for erectile dysfunction. 01/20/19   Argentina Donovan, PA-C   traZODone (DESYREL) 50 MG tablet Take 0.5-1 tablets (25-50 mg total) by mouth at bedtime as needed for sleep. 01/20/19   Argentina Donovan, PA-C     Objective:  EXAM:   Vitals:   01/20/19 1504  BP: 133/72  Pulse: 85  Temp: 98.8 F (37.1 C)  TempSrc: Oral  SpO2: 95%  Weight: 187 lb (84.8 kg)    General appearance : A&OX3. NAD. Non-toxic-appearing HEENT: Atraumatic and Normocephalic.  PERRLA. EOM intact. Chest/Lungs:  Breathing-non-labored, Good air entry bilaterally, breath sounds normal without rales, rhonchi, or wheezing  CVS: S1 S2 regular, no murmurs, gallops, rubs  TTP along R ribs in axillary line/lower ribs.   Extremities: Bilateral Lower Ext shows no edema, both legs are warm to touch with = pulse throughout Neurology:  CN II-XII grossly intact, Non focal.   Psych:  TP linear. J/I WNL. Normal speech. Appropriate eye contact and affect.  Skin:  No Rash  Data Review Lab Results  Component Value Date   HGBA1C 6.1 (H) 10/10/2015     Assessment & Plan   1. Multiple fractures of ribs, right side, init for clos fx Continue methocarbamol.  Avoid alcohol.   - predniSONE (DELTASONE) 10 MG tablet; 6,5,4,3,2,1 take each days dose in the morning with food  Dispense: 21 tablet; Refill: 0  2. Thrombocytopenia ([place]) -avoid alcohol.  AA website given.  I have counseled the patient at length about  substance abuse and addiction.  12 step meetings/recovery recommended.  Local 12 step meeting lists were given and attendance was encouraged.  Patient expresses understanding.  - CBC with Differential/Platelet  3. Insomnia, unspecified type - traZODone (DESYREL) 50 MG tablet; Take 0.5-1 tablets (25-50 mg total) by mouth at bedtime as needed for sleep.  Dispense: 30 tablet; Refill: 0  4. Abnormal laboratory test - Comprehensive metabolic panel - CBC with Differential/Platelet  5. Erectile dysfunction, unspecified erectile dysfunction type Avoid nitrates.  R/B discussed.   -  sildenafil (VIAGRA) 50 MG tablet; Take 1 tablet (50 mg total) by mouth daily as needed for erectile dysfunction.  Dispense: 10 tablet; Refill: 4  6. Encounter for examination following treatment at hospital improving     Patient have been counseled extensively about nutrition and exercise  Return in about 3 months (around 04/22/2019) for with PCP.  The patient was given clear instructions to go to ER or return to medical center if symptoms don't improve, worsen or new problems develop. The patient verbalized understanding. The patient was told to call to get lab results if they haven't heard anything in the next week.     Freeman Caldron, PA-C [facility] and [facility], [place]   01/20/2019, 3:22 PM

## 2019-01-21 LAB — CBC WITH DIFFERENTIAL/PLATELET
Basophils Absolute: 0 10*3/uL (ref 0.0–0.2)
Basos: 0 %
EOS (ABSOLUTE): 0 10*3/uL (ref 0.0–0.4)
Eos: 1 %
Hematocrit: 45.5 % (ref 37.5–51.0)
Hemoglobin: 14.6 g/dL (ref 13.0–17.7)
Immature Grans (Abs): 0 10*3/uL (ref 0.0–0.1)
Immature Granulocytes: 0 %
Lymphocytes Absolute: 1 10*3/uL (ref 0.7–3.1)
Lymphs: 20 %
MCH: 28.6 pg (ref 26.6–33.0)
MCHC: 32.1 g/dL (ref 31.5–35.7)
MCV: 89 fL (ref 79–97)
Monocytes Absolute: 0.3 10*3/uL (ref 0.1–0.9)
Monocytes: 7 %
Neutrophils Absolute: 3.5 10*3/uL (ref 1.4–7.0)
Neutrophils: 72 %
Platelets: 50 10*3/uL — CL (ref 150–450)
RBC: 5.1 x10E6/uL (ref 4.14–5.80)
RDW: 13.5 % (ref 11.6–15.4)
WBC: 4.8 10*3/uL (ref 3.4–10.8)

## 2019-01-21 LAB — COMPREHENSIVE METABOLIC PANEL
ALT: 42 IU/L (ref 0–44)
AST: 54 IU/L — ABNORMAL HIGH (ref 0–40)
Albumin/Globulin Ratio: 1.1 — ABNORMAL LOW (ref 1.2–2.2)
Albumin: 3.9 g/dL (ref 3.8–4.8)
Alkaline Phosphatase: 165 IU/L — ABNORMAL HIGH (ref 39–117)
BUN/Creatinine Ratio: 9 — ABNORMAL LOW (ref 10–24)
BUN: 8 mg/dL (ref 8–27)
Bilirubin Total: 0.6 mg/dL (ref 0.0–1.2)
CO2: 23 mmol/L (ref 20–29)
Calcium: 8.8 mg/dL (ref 8.6–10.2)
Chloride: 103 mmol/L (ref 96–106)
Creatinine, Ser: 0.91 mg/dL (ref 0.76–1.27)
GFR calc Af Amer: 102 mL/min/{1.73_m2} (ref >59)
GFR calc non Af Amer: 88 mL/min/{1.73_m2} (ref >59)
Globulin, Total: 3.7 g/dL (ref 1.5–4.5)
Glucose: 146 mg/dL — ABNORMAL HIGH (ref 65–99)
Potassium: 4.2 mmol/L (ref 3.5–5.2)
Sodium: 138 mmol/L (ref 134–144)
Total Protein: 7.6 g/dL (ref 6.0–8.5)

## 2019-02-03 ENCOUNTER — Ambulatory Visit: Payer: Medicare Other | Attending: Family Medicine | Admitting: Family Medicine

## 2019-02-03 ENCOUNTER — Other Ambulatory Visit: Payer: Self-pay

## 2019-02-03 DIAGNOSIS — S2241XD Multiple fractures of ribs, right side, subsequent encounter for fracture with routine healing: Secondary | ICD-10-CM | POA: Diagnosis not present

## 2019-02-03 MED ORDER — NAPROXEN 500 MG PO TABS: 500.0000 mg | ORAL_TABLET | Freq: Two times a day (BID) | ORAL | 1 refills | Status: DC

## 2019-02-03 NOTE — Progress Notes (Signed)
Virtual Visit via Telephone Note  I connected with Luis Morrison, on 02/03/2019 at 1:43 PM by telephone due to the COVID-19 pandemic and verified that I am speaking with the correct person using two identifiers.   Consent: I discussed the limitations, risks, security and privacy concerns of performing an evaluation and management service by telephone and the availability of in person appointments. I also discussed with the patient that there may be a patient responsible charge related to this service. The patient expressed understanding and agreed to proceed.   Location of Patient: Home  Location of Provider: Clinic   Persons participating in Telemedicine visit: Guadlupe Spanish [place]-CMA Dr. Felecia Shelling     History of Present Illness: 63 year old male with a history of multiple rib fractures of the right side, hospitalized on 12/19/2018.  He was seen by the PA on 01/20/2019 for follow-up visit and treated with a prednisone taper and Robaxin.  Today he c/o pain in ribcage which occurs at night; daytime is better. Thinks he needs a stronger pain pill; pain is 5/10 right now and 8/10 at night.  He has completed his course of prednisone. Pain is also worse when he takes deep breaths but states at night he is unable to get comfortable.  Past Medical History:  Diagnosis Date  . Cirrhosis ([place]) 10/09/2014   No Known Allergies  Current Outpatient Medications on File Prior to Visit  Medication Sig Dispense Refill  . acetaminophen (TYLENOL) 325 MG tablet Take 2 tablets (650 mg total) by mouth every 6 (six) hours as needed for mild pain or fever.    . methocarbamol (ROBAXIN) 500 MG tablet Take 2 tablets (1,000 mg total) by mouth 4 (four) times daily. 60 tablet 1  . predniSONE (DELTASONE) 10 MG tablet 6,5,4,3,2,1 take each days dose in the morning with food 21 tablet 0  . sildenafil (VIAGRA) 50 MG tablet Take 1 tablet (50 mg total) by mouth daily as needed for erectile dysfunction. 10  tablet 4  . traZODone (DESYREL) 50 MG tablet Take 0.5-1 tablets (25-50 mg total) by mouth at bedtime as needed for sleep. 30 tablet 0   No current facility-administered medications on file prior to visit.     Observations/Objective: Awake, alert, oriented x3 Not in acute distress  Assessment and Plan: 1. Closed fracture of multiple ribs of right side with routine healing, subsequent encounter Continue Robaxin Advised that healing will be a slow process - naproxen (NAPROSYN) 500 MG tablet; Take 1 tablet (500 mg total) by mouth 2 (two) times daily with a meal.  Dispense: 60 tablet; Refill: 1   Follow Up Instructions: Return for follow up keep previously scheduled appointment with PCP.    I discussed the assessment and treatment plan with the patient. The patient was provided an opportunity to ask questions and all were answered. The patient agreed with the plan and demonstrated an understanding of the instructions.   The patient was advised to call back or seek an in-person evaluation if the symptoms worsen or if the condition fails to improve as anticipated.     I provided 9 minutes total of non-face-to-face time during this encounter including median intraservice time, reviewing previous notes, labs, imaging, medications, management and patient verbalized understanding.     Charlott Rakes, MD, FAAFP. [facility] and [place], [place]   02/03/2019, 1:43 PM

## 2019-02-03 NOTE — Progress Notes (Signed)
Patient has been called and DOB has been verified. Patient has been screened and transferred to PCP to start phone visit.     

## 2019-04-19 ENCOUNTER — Ambulatory Visit: Payer: Medicare Other | Admitting: Internal Medicine

## 2019-05-03 ENCOUNTER — Encounter: Payer: Self-pay | Admitting: Internal Medicine

## 2019-05-03 ENCOUNTER — Other Ambulatory Visit: Payer: Self-pay

## 2019-05-03 ENCOUNTER — Ambulatory Visit: Payer: Medicare Other | Attending: Internal Medicine | Admitting: Internal Medicine

## 2019-05-03 DIAGNOSIS — N529 Male erectile dysfunction, unspecified: Secondary | ICD-10-CM

## 2019-05-03 DIAGNOSIS — F10188 Alcohol abuse with other alcohol-induced disorder: Secondary | ICD-10-CM | POA: Diagnosis not present

## 2019-05-03 DIAGNOSIS — R7303 Prediabetes: Secondary | ICD-10-CM

## 2019-05-03 DIAGNOSIS — K7460 Unspecified cirrhosis of liver: Secondary | ICD-10-CM

## 2019-05-03 DIAGNOSIS — E559 Vitamin D deficiency, unspecified: Secondary | ICD-10-CM

## 2019-05-03 DIAGNOSIS — F1010 Alcohol abuse, uncomplicated: Secondary | ICD-10-CM | POA: Diagnosis not present

## 2019-05-03 DIAGNOSIS — Z23 Encounter for immunization: Secondary | ICD-10-CM

## 2019-05-03 MED ORDER — SILDENAFIL CITRATE 50 MG PO TABS: ORAL_TABLET | ORAL | 6 refills | Status: DC

## 2019-05-03 NOTE — Progress Notes (Signed)
Virtual Visit via Telephone Note Due to current restrictions/limitations of in-office visits due to the COVID-19 pandemic, this scheduled clinical appointment was converted to a telehealth visit  I connected with Luis Morrison on 05/03/19 at 10:15 a.m by telephone and verified that I am speaking with the correct person using two identifiers. I am in my office.  The patient is at home.  Only the patient and myself participated in this encounter.  I discussed the limitations, risks, security and privacy concerns of performing an evaluation and management service by telephone and the availability of in person appointments. I also discussed with the patient that there may be a patient responsible charge related to this service. The patient expressed understanding and agreed to proceed.   History of Present Illness: Hx Hep Cstatus post successful treatment with Harvoni,cirrhosis, GERD,Vit D def, ETOH abuse, preDM  ED:  Request RF on Viagra  ETOH:  States he has cut back a lot.  No longer drinks hard liquor.  He states he has a few beers on the weekends.  Blood test done back in August revealed persistently low platelet count and some elevation in AST/alkaline phosphatase.  He had CT scan of the abdomen done in July of this year through the emergency room.  It revealed liver changes consistent with cirrhosis. -he has hx ofd pre-DM.  Feels he does okay with eating habits.  HM:  Due for flu and Prevnar willing to have them  Outpatient Encounter Medications as of 05/03/2019  Medication Sig  . acetaminophen (TYLENOL) 325 MG tablet Take 2 tablets (650 mg total) by mouth every 6 (six) hours as needed for mild pain or fever. (Patient not taking: Reported on 05/03/2019)  . methocarbamol (ROBAXIN) 500 MG tablet Take 2 tablets (1,000 mg total) by mouth 4 (four) times daily. (Patient not taking: Reported on 05/03/2019)  . naproxen (NAPROSYN) 500 MG tablet Take 1 tablet (500 mg total) by mouth 2 (two) times  daily with a meal. (Patient not taking: Reported on 05/03/2019)  . predniSONE (DELTASONE) 10 MG tablet 6,5,4,3,2,1 take each days dose in the morning with food (Patient not taking: Reported on 05/03/2019)  . sildenafil (VIAGRA) 50 MG tablet Take 1 tablet (50 mg total) by mouth daily as needed for erectile dysfunction.  . traZODone (DESYREL) 50 MG tablet Take 0.5-1 tablets (25-50 mg total) by mouth at bedtime as needed for sleep.   No facility-administered encounter medications on file as of 05/03/2019.       Observations/Objective: No direct observation done  Assessment and Plan: 1. Erectile dysfunction, unspecified erectile dysfunction type - sildenafil (VIAGRA) 50 MG tablet; Take 1 tab 1/2 hr prior to sex PRN.  Limit use to 1 tab/24 hr period.  Dispense: 10 tablet; Refill: 6  2. Alcohol use disorder, mild, abuse -encourage him to abstain altogether  3. Cirrhosis of liver without ascites, unspecified hepatic cirrhosis type ([place]) Plan to do repeat imaging in 6-12 mths - Hepatic Function Panel; Future  4. Prediabetes Due for recheck - Hemoglobin A1c; Future  5. Vitamin D deficiency - VITAMIN D 25 Hydroxy (Vit-D Deficiency, Fractures); Future  6. Need for influenza vaccination 7. Need for vaccination against Streptococcus pneumoniae using pneumococcal conjugate vaccine 13 -pt will come as nurse only visit later this wk to get these vaccines   Follow Up Instructions: 4 mths   I discussed the assessment and treatment plan with the patient. The patient was provided an opportunity to ask questions and all were answered. The patient  agreed with the plan and demonstrated an understanding of the instructions.   The patient was advised to call back or seek an in-person evaluation if the symptoms worsen or if the condition fails to improve as anticipated.  I provided 5 minutes of non-face-to-face time during this encounter.   Karle Plumber, MD

## 2019-05-04 ENCOUNTER — Ambulatory Visit: Payer: Medicare Other | Attending: Internal Medicine | Admitting: Pharmacist

## 2019-05-04 ENCOUNTER — Other Ambulatory Visit: Payer: Self-pay

## 2019-05-04 DIAGNOSIS — E559 Vitamin D deficiency, unspecified: Secondary | ICD-10-CM

## 2019-05-04 DIAGNOSIS — R7303 Prediabetes: Secondary | ICD-10-CM

## 2019-05-04 DIAGNOSIS — Z23 Encounter for immunization: Secondary | ICD-10-CM

## 2019-05-04 DIAGNOSIS — K7460 Unspecified cirrhosis of liver: Secondary | ICD-10-CM

## 2019-05-04 NOTE — Progress Notes (Signed)
Patient presents for vaccination against influenza and strep pneumo per orders of Dr. Wynetta Emery. Consent given. Counseling provided. No contraindications exists. Vaccine administered without incident.

## 2019-05-05 ENCOUNTER — Other Ambulatory Visit: Payer: Self-pay | Admitting: Internal Medicine

## 2019-05-05 ENCOUNTER — Telehealth: Payer: Self-pay

## 2019-05-05 LAB — VITAMIN D 25 HYDROXY (VIT D DEFICIENCY, FRACTURES): Vit D, 25-Hydroxy: 12 ng/mL — ABNORMAL LOW (ref 30.0–100.0)

## 2019-05-05 LAB — HEPATIC FUNCTION PANEL
ALT: 36 IU/L (ref 0–44)
AST: 48 IU/L — ABNORMAL HIGH (ref 0–40)
Albumin: 3.9 g/dL (ref 3.8–4.8)
Alkaline Phosphatase: 178 IU/L — ABNORMAL HIGH (ref 39–117)
Bilirubin Total: 1.1 mg/dL (ref 0.0–1.2)
Bilirubin, Direct: 0.39 mg/dL (ref 0.00–0.40)
Total Protein: 7.9 g/dL (ref 6.0–8.5)

## 2019-05-05 LAB — HEMOGLOBIN A1C
Est. average glucose Bld gHb Est-mCnc: 111 mg/dL
Hgb A1c MFr Bld: 5.5 % (ref 4.8–5.6)

## 2019-05-05 MED ORDER — ERGOCALCIFEROL 1.25 MG (50000 UT) PO CAPS: 50000.0000 [IU] | ORAL_CAPSULE | ORAL | 1 refills | Status: DC

## 2019-05-05 NOTE — Telephone Encounter (Signed)
Contacted pt to go over lab results pt is aware and doesn't have any questions or concerns 

## 2019-06-24 ENCOUNTER — Other Ambulatory Visit: Payer: Self-pay

## 2019-06-24 ENCOUNTER — Ambulatory Visit: Payer: Medicare Other | Attending: Family | Admitting: Family

## 2019-06-24 DIAGNOSIS — R05 Cough: Secondary | ICD-10-CM | POA: Diagnosis not present

## 2019-06-24 DIAGNOSIS — Z20822 Contact with and (suspected) exposure to covid-19: Secondary | ICD-10-CM

## 2019-06-24 DIAGNOSIS — R059 Cough, unspecified: Secondary | ICD-10-CM

## 2019-06-24 MED ORDER — BENZONATATE 100 MG PO CAPS: 100.0000 mg | ORAL_CAPSULE | Freq: Two times a day (BID) | ORAL | 0 refills | Status: DC | PRN

## 2019-06-24 NOTE — Progress Notes (Signed)
Virtual Visit via Telephone Note  I connected with Luis Morrison, on 06/24/2019 at 11:10 AM by telephone due to the COVID-19 pandemic and verified that I am speaking with the correct person using two identifiers.  Due to current restrictions/limitations of in-office visits due to the COVID-19 pandemic, this scheduled clinical appointment was converted to a telehealth visit.   Consent: I discussed the limitations, risks, security and privacy concerns of performing an evaluation and management service by telephone and the availability of in person appointments. I also discussed with the patient that there may be a patient responsible charge related to this service. The patient expressed understanding and agreed to proceed.  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 1. Cough:  Cough began 3 to 4 weeks ago. Has taken [place] throat lozenges and Nyquil with no relief. Talking aggravates cough. Nothing alleviates cough. Admits hoarseness. Denies drooling. Admits nasal congestion. Admits runny nose with green mucus. Admits sore throat occasionally. Admits shortness of breath last week. Denies shortness of breath this week. Admits chest pain only with coughing. Denies palpitations. Admits bilateral eyes watery with red tint. Denies fever. Denies chills. Denies neck swelling. Denies neck tenderness. Denies white coating on tongue and throat. Denies earache. Drinking liquids, orange juice, teas,and soups to help soothe cough with no relief. Denies smoking. Drinks water and beer most of the time. Lives at home with uncle Requesting medication  for cough. Covid-19 last tested July 2020 with negative result.    Past Medical History:  Diagnosis Date  . Cirrhosis ([place]) 10/09/2014   No Known Allergies  Current Outpatient Medications on File Prior to Visit  Medication Sig Dispense Refill  . acetaminophen (TYLENOL) 325 MG tablet Take 2 tablets (650 mg total) by mouth every 6 (six) hours as needed for mild pain or fever. (Patient not taking: Reported on 05/03/2019)    . ergocalciferol (VITAMIN D2) 1.25 MG (50000 UT) capsule Take 1 capsule (50,000 Units total) by mouth once a week. Vit D deficiency - E55.9 12 capsule 1  . methocarbamol (ROBAXIN) 500 MG tablet Take 2 tablets (1,000 mg total) by mouth 4 (four) times daily. (Patient not taking: Reported on 05/03/2019) 60 tablet 1  . naproxen (NAPROSYN) 500 MG tablet Take 1 tablet (500 mg total) by mouth 2 (two) times daily with a meal. (Patient not taking: Reported on 05/03/2019) 60 tablet 1  . predniSONE (DELTASONE) 10 MG tablet 6,5,4,3,2,1 take each days dose in the morning with food (Patient not taking: Reported on 05/03/2019) 21 tablet 0  . sildenafil (VIAGRA) 50 MG tablet Take 1 tab 1/2 hr prior to sex PRN.  Limit use to 1 tab/24 hr period. 10 tablet 6  . traZODone (DESYREL) 50 MG tablet Take 0.5-1 tablets (25-50 mg total) by mouth at bedtime as needed for sleep. 30 tablet 0   No current facility-administered medications on file prior to visit.    Observations/Objective: Alert and oriented x 3. Not in acute distress. No physical examination as this is a tele visit.  Assessment and Plan: 1. Cough:  -Covid-19 testing ordered. Patient given phone number contact [phone]. Testing available Monday through Friday from 8:30 am until 3:30 pm. -Given the  upper respiratory symptoms of patients a viral cause may be likely therefore Covid-19 testing ordered -Benzonatate (TESSALON); Take 1 capsule (100 mg) total by mouth 2 (two) times daily as needed for cough. Dispense: 14 capsules Refill:  0 -Wash hands to prevent infection -Wear a mask -Continue to social distance  Follow Up Instructions: Awaiting Covid-19 results. Seek medical attention at the Emergency Department if symptoms worsen and orsevere.   I discussed the assessment and treatment plan with the patient. The patient was provided an opportunity to ask questions and all were answered. The patient agreed with the plan and demonstrated an understanding of the instructions.   The patient was advised to call back or seek an in-person evaluation if the symptoms worsen or if the condition fails to improve as anticipated.   I provided 20 minutes total of non-face-to-face time during this encounter including median intraservice time, reviewing previous notes, labs, imaging, medications, management and patient verbalized understanding.    Camillia Herter, NP  [facility] and [facility] [place], [place]   06/24/2019, 2:11 PM

## 2019-06-25 LAB — NOVEL CORONAVIRUS, NAA: SARS-CoV-2, NAA: DETECTED — AB

## 2019-06-26 ENCOUNTER — Encounter: Payer: Self-pay | Admitting: Nurse Practitioner

## 2019-06-26 ENCOUNTER — Telehealth: Payer: Self-pay | Admitting: Nurse Practitioner

## 2019-06-26 NOTE — Telephone Encounter (Signed)
Called to Discuss with patient about Covid symptoms and the use of bamlanivimab, a monoclonal antibody infusion for those with mild to moderate Covid symptoms and at a high risk of hospitalization.     Pt is qualified for this infusion at the [facility] infusion center due to co-morbid conditions and/or a member of an at-risk group.     Unable to reach pt. Left message to return call. Will also send info regarding bamlanivimab via mychart.   Beckey Rutter, DNP, AGNP-C

## 2019-07-23 ENCOUNTER — Other Ambulatory Visit: Payer: Self-pay | Admitting: Internal Medicine

## 2019-07-23 DIAGNOSIS — N529 Male erectile dysfunction, unspecified: Secondary | ICD-10-CM

## 2019-09-02 ENCOUNTER — Other Ambulatory Visit: Payer: Self-pay

## 2019-09-02 ENCOUNTER — Encounter: Payer: Self-pay | Admitting: Internal Medicine

## 2019-09-02 ENCOUNTER — Ambulatory Visit: Payer: Medicare Other | Attending: Internal Medicine | Admitting: Internal Medicine

## 2019-09-02 VITALS — BP 150/88 | HR 89 | Temp 98.6°F | Resp 16 | Wt 195.4 lb

## 2019-09-02 DIAGNOSIS — R030 Elevated blood-pressure reading, without diagnosis of hypertension: Secondary | ICD-10-CM

## 2019-09-02 DIAGNOSIS — E559 Vitamin D deficiency, unspecified: Secondary | ICD-10-CM

## 2019-09-02 DIAGNOSIS — K7460 Unspecified cirrhosis of liver: Secondary | ICD-10-CM | POA: Diagnosis not present

## 2019-09-02 DIAGNOSIS — D696 Thrombocytopenia, unspecified: Secondary | ICD-10-CM

## 2019-09-02 DIAGNOSIS — I10 Essential (primary) hypertension: Secondary | ICD-10-CM | POA: Diagnosis not present

## 2019-09-02 DIAGNOSIS — F10188 Alcohol abuse with other alcohol-induced disorder: Secondary | ICD-10-CM | POA: Insufficient documentation

## 2019-09-02 DIAGNOSIS — H5200 Hypermetropia, unspecified eye: Secondary | ICD-10-CM

## 2019-09-02 DIAGNOSIS — Z7189 Other specified counseling: Secondary | ICD-10-CM

## 2019-09-02 DIAGNOSIS — I8510 Secondary esophageal varices without bleeding: Secondary | ICD-10-CM

## 2019-09-02 MED ORDER — ERGOCALCIFEROL 1.25 MG (50000 UT) PO CAPS: 50000.0000 [IU] | ORAL_CAPSULE | ORAL | 1 refills | Status: DC

## 2019-09-02 NOTE — Patient Instructions (Addendum)
I have sent the prescription to your pharmacy for the vitamin D supplement to take once a week.  Try cut back to no more than 2 mixed drinks on Fridays and Saturdays.  Try to cut back on the amount of salt that you are putting in your foods.  You can call [phone] or go to [email or web address] to get an appointment for Luis Morrison the Covid vaccine.

## 2019-09-02 NOTE — Progress Notes (Signed)
Patient ID: Luis Morrison, male    DOB: 1955-11-16  MRN: [id number]  CC: Hypertension   Subjective: Luis Morrison is a 64 y.o. male who presents for chronic disease management. His concerns today include:  Hx Hep Cstatus post successful treatment with Harvoni,cirrhosis, GERD,Vit D def, ETOH abuse, preDM  Cirrhosis: Overall patient states he is doing well.  Stomach ache occasionally.  No bleeding -He reports drinking 4-5 glasses of mixed drink of Coke and bourbon on the weekends Last liver imaging 12/2018 -History of esophageal varices for which she was on nadolol in the past.  Currently not on any medications  BP elev today.  He denies any chest pains, shortness of breath, lower extremity edema. Admits that he is heavy on salt shaker He walks almost daily.  Vit D Def:  Out of supplementation.  Requests refills  Requests referral to an eye doctor.  He has problems seeing things close up.  Since last visit with me he has gotten his dentures above and below. Patient Active Problem List   Diagnosis Date Noted  . Alcohol abuse with other alcohol-induced disorder ([place]) 09/02/2019  . Multiple fractures of ribs, right side, init for clos fx 12/20/2018  . Thrombocytopenia ([place]) 12/20/2018  . Pneumothorax on right 12/20/2018  . Compensated HCV cirrhosis ([place]) 12/20/2018  . Cortical age-related cataract of both eyes 10/09/2017  . Secondary esophageal varices without bleeding ([place]) 05/30/2017  . Prediabetes 02/26/2017  . Alcohol abuse 08/06/2016  . Vitamin D deficiency 12/06/2015  . Cirrhosis of liver without ascites ([place]) 10/09/2014  . Hepatitis C virus infection cured after antiviral drug therapy 10/09/2014     Current Outpatient Medications on File Prior to Visit  Medication Sig Dispense Refill  . ergocalciferol (VITAMIN D2) 1.25 MG (50000 UT) capsule Take 1 capsule (50,000 Units total) by mouth once a week. Vit D deficiency - E55.9 (Patient not taking: Reported on 09/02/2019) 12  capsule 1  . sildenafil (VIAGRA) 50 MG tablet TAKE ONE TABLET BY MOUTH HALF AN HOUR PRIOR TO SEXUAL ACTIVITY AS NEEDED; LIMIT USE TO ONE TABLET EVERY 24 HOURS !-CAUTION (Patient not taking: Reported on 09/02/2019) 10 tablet 2  . traZODone (DESYREL) 50 MG tablet Take 0.5-1 tablets (25-50 mg total) by mouth at bedtime as needed for sleep. (Patient not taking: Reported on 09/02/2019) 30 tablet 0   No current facility-administered medications on file prior to visit.    No Known Allergies  Social History   Socioeconomic History  . Marital status: Single    Spouse name: Not on file  . Number of children: Not on file  . Years of education: Not on file  . Highest education level: Not on file  Occupational History  . Not on file  Tobacco Use  . Smoking status: Never Smoker  . Smokeless tobacco: Never Used  Substance and Sexual Activity  . Alcohol use: Yes    Alcohol/week: 2.0 standard drinks    Types: 2 Cans of beer per week  . Drug use: No  . Sexual activity: Not on file  Other Topics Concern  . Not on file  Social History Narrative  . Not on file   Social Determinants of Health   Financial Resource Strain:   . Difficulty of Paying Living Expenses:   Food Insecurity:   . Worried About Charity fundraiser in the Last Year:   . Arboriculturist in the Last Year:   Transportation Needs:   . Lack of Transportation (  Medical):   Marland Kitchen Lack of Transportation (Non-Medical):   Physical Activity:   . Days of Exercise per Week:   . Minutes of Exercise per Session:   Stress:   . Feeling of Stress :   Social Connections:   . Frequency of Communication with Friends and Family:   . Frequency of Social Gatherings with Friends and Family:   . Attends Religious Services:   . Active Member of Clubs or Organizations:   . Attends Archivist Meetings:   Marland Kitchen Marital Status:   Intimate Partner Violence:   . Fear of Current or Ex-Partner:   . Emotionally Abused:   Marland Kitchen Physically Abused:   .  Sexually Abused:     Family History  Problem Relation Age of Onset  . Cancer Mother   . Brain cancer Mother   . Colon cancer Neg Hx   . Colon polyps Neg Hx   . Esophageal cancer Neg Hx   . Rectal cancer Neg Hx   . Stomach cancer Neg Hx     No past surgical history on file.  ROS: Review of Systems Negative except as stated above  PHYSICAL EXAM: BP (!) 150/88   Pulse 89   Temp 98.6 F (37 C)   Resp 16   Wt 195 lb 6.4 oz (88.6 kg)   SpO2 97%   BMI 28.04 kg/m   Wt Readings from Last 3 Encounters:  09/02/19 195 lb 6.4 oz (88.6 kg)  01/20/19 187 lb (84.8 kg)  12/20/18 196 lb 3.4 oz (89 kg)    Physical Exam   General appearance - alert, well appearing, and in no distress Mental status - normal mood, behavior, speech, dress, motor activity, and thought processes Chest - clear to auscultation, no wheezes, rales or rhonchi, symmetric air entry Heart - normal rate, regular rhythm, normal S1, S2, no murmurs, rubs, clicks or gallops Abdomen - soft, nontender, nondistended, no masses or organomegaly Extremities - peripheral pulses normal, no pedal edema, no clubbing or cyanosis  CMP Latest Ref Rng & Units 05/04/2019 01/20/2019 12/21/2018  Glucose 65 - 99 mg/dL - 146(H) 125(H)  BUN 8 - 27 mg/dL - 8 10  Creatinine 0.76 - 1.27 mg/dL - 0.91 1.03  Sodium 134 - 144 mmol/L - 138 134(L)  Potassium 3.5 - 5.2 mmol/L - 4.2 3.7  Chloride 96 - 106 mmol/L - 103 102  CO2 20 - 29 mmol/L - 23 24  Calcium 8.6 - 10.2 mg/dL - 8.8 8.3(L)  Total Protein 6.0 - 8.5 g/dL 7.9 7.6 7.0  Total Bilirubin 0.0 - 1.2 mg/dL 1.1 0.6 1.0  Alkaline Phos 39 - 117 IU/L 178(H) 165(H) 109  AST 0 - 40 IU/L 48(H) 54(H) 51(H)  ALT 0 - 44 IU/L 36 42 34   Lipid Panel     Component Value Date/Time   CHOL 145 04/20/2018 0905   TRIG 60 04/20/2018 0905   HDL 53 04/20/2018 0905   CHOLHDL 2.7 04/20/2018 0905   LDLCALC 80 04/20/2018 0905    CBC    Component Value Date/Time   WBC 4.8 01/20/2019 1529   WBC 6.6  12/21/2018 0238   RBC 5.10 01/20/2019 1529   RBC 4.73 12/21/2018 0238   HGB 14.6 01/20/2019 1529   HCT 45.5 01/20/2019 1529   PLT 50 (LL) 01/20/2019 1529   MCV 89 01/20/2019 1529   MCH 28.6 01/20/2019 1529   MCH 29.2 12/21/2018 0238   MCHC 32.1 01/20/2019 1529   MCHC 32.5 12/21/2018 0238  RDW 13.5 01/20/2019 1529   LYMPHSABS 1.0 01/20/2019 1529   MONOABS 357 07/30/2016 1126   EOSABS 0.0 01/20/2019 1529   BASOSABS 0.0 01/20/2019 1529    ASSESSMENT AND PLAN:  1. Cirrhosis of liver without ascites, unspecified hepatic cirrhosis type ([place]) Will be due for ultrasound later this summer. He has thrombocytopenia.  We will recheck CBC today. Encouraged him to abstain from alcohol completely.  However patient has cut back but has not been able to abstain.  We talked about harm reduction.  If he is going to drink at least let it not exceed what is the recommended daily limit for men which is 2 standard drinks a day. - CBC - Comprehensive metabolic panel  2. Alcohol abuse with other alcohol-induced disorder ([place]) See #1 above  3. Thrombocytopenia ([place]) Likely due to history of cirrhosis  4. Vitamin D deficiency - ergocalciferol (VITAMIN D2) 1.25 MG (50000 UT) capsule; Take 1 capsule (50,000 Units total) by mouth once a week. Vit D deficiency - E55.9  Dispense: 12 capsule; Refill: 1  5. Secondary esophageal varices without bleeding ([place]) He has not had any recent GI bleed.  6. Elevated blood pressure reading DASH diet discussed and encouraged.  He will follow-up in 6 weeks for his welcome to Medicare wellness visit.  We will plan to recheck the blood pressure at that time.  If still elevated we will put him on nadolol.  7. Educated about COVID-19 virus infection Given information of phone number to call to schedule appointment for Covid vaccine which she would like to get.  8. Hypermetropia, unspecified laterality - Ambulatory referral to Ophthalmology   Patient was given the  opportunity to ask questions.  Patient verbalized understanding of the plan and was able to repeat key elements of the plan.   Orders Placed This Encounter  Procedures  . CBC  . Comprehensive metabolic panel     Requested Prescriptions    No prescriptions requested or ordered in this encounter    Return in about 7 weeks (around 10/21/2019) for Welcome To Medicare Wellness and recheck BP.  Karle Plumber, MD, FACP

## 2019-09-03 LAB — COMPREHENSIVE METABOLIC PANEL
ALT: 29 IU/L (ref 0–44)
AST: 46 IU/L — ABNORMAL HIGH (ref 0–40)
Albumin/Globulin Ratio: 0.9 — ABNORMAL LOW (ref 1.2–2.2)
Albumin: 3.5 g/dL — ABNORMAL LOW (ref 3.8–4.8)
Alkaline Phosphatase: 131 IU/L — ABNORMAL HIGH (ref 39–117)
BUN/Creatinine Ratio: 9 — ABNORMAL LOW (ref 10–24)
BUN: 9 mg/dL (ref 8–27)
Bilirubin Total: 0.5 mg/dL (ref 0.0–1.2)
CO2: 22 mmol/L (ref 20–29)
Calcium: 8.5 mg/dL — ABNORMAL LOW (ref 8.6–10.2)
Chloride: 106 mmol/L (ref 96–106)
Creatinine, Ser: 0.97 mg/dL (ref 0.76–1.27)
GFR calc Af Amer: 94 mL/min/{1.73_m2} (ref >59)
GFR calc non Af Amer: 82 mL/min/{1.73_m2} (ref >59)
Globulin, Total: 3.8 g/dL (ref 1.5–4.5)
Glucose: 84 mg/dL (ref 65–99)
Potassium: 3.9 mmol/L (ref 3.5–5.2)
Sodium: 141 mmol/L (ref 134–144)
Total Protein: 7.3 g/dL (ref 6.0–8.5)

## 2019-09-03 LAB — CBC
Hematocrit: 46.2 % (ref 37.5–51.0)
Hemoglobin: 15.3 g/dL (ref 13.0–17.7)
MCH: 29.4 pg (ref 26.6–33.0)
MCHC: 33.1 g/dL (ref 31.5–35.7)
MCV: 89 fL (ref 79–97)
Platelets: 55 10*3/uL — CL (ref 150–450)
RBC: 5.2 x10E6/uL (ref 4.14–5.80)
RDW: 14.2 % (ref 11.6–15.4)
WBC: 4.7 10*3/uL (ref 3.4–10.8)

## 2019-09-10 ENCOUNTER — Ambulatory Visit: Payer: Medicaid Other | Admitting: Internal Medicine

## 2019-10-07 ENCOUNTER — Ambulatory Visit: Payer: Medicare Other | Admitting: Family

## 2019-10-25 ENCOUNTER — Ambulatory Visit: Payer: Medicare Other | Admitting: Internal Medicine

## 2019-11-16 ENCOUNTER — Other Ambulatory Visit: Payer: Self-pay | Admitting: Internal Medicine

## 2019-11-16 DIAGNOSIS — N529 Male erectile dysfunction, unspecified: Secondary | ICD-10-CM

## 2019-11-20 ENCOUNTER — Other Ambulatory Visit: Payer: Self-pay | Admitting: Internal Medicine

## 2019-11-20 DIAGNOSIS — N529 Male erectile dysfunction, unspecified: Secondary | ICD-10-CM

## 2019-11-23 ENCOUNTER — Telehealth: Payer: Self-pay | Admitting: Internal Medicine

## 2019-11-23 NOTE — Telephone Encounter (Signed)
1) Medication(s) Requested (by name): sildenafil (VIAGRA) 50 MG tablet   2) Pharmacy of Choice: [facility] [address] [place]  [address] [place]  [address]   3) Special Requests:   Approved medications will be sent to the pharmacy, we will reach out if there is an issue.  Requests made after 3pm may not be addressed until the following business day!  If a patient is unsure of the name of the medication(s) please note and ask patient to call back when they are able to provide all info, do not send to responsible party until all information is available!

## 2019-11-24 MED ORDER — SILDENAFIL CITRATE 50 MG PO TABS: ORAL_TABLET | ORAL | 5 refills | Status: DC

## 2019-11-24 NOTE — Telephone Encounter (Signed)
Refill sent on Viagra.

## 2019-11-28 ENCOUNTER — Other Ambulatory Visit: Payer: Self-pay

## 2019-11-28 ENCOUNTER — Encounter (HOSPITAL_COMMUNITY): Payer: Self-pay

## 2019-11-28 ENCOUNTER — Emergency Department (HOSPITAL_COMMUNITY)
Admission: EM | Admit: 2019-11-28 | Discharge: 2019-11-28 | Disposition: A | Discharge status: Home / Self Care | Payer: Medicare Other | Attending: Emergency Medicine | Admitting: Emergency Medicine

## 2019-11-28 ENCOUNTER — Emergency Department (HOSPITAL_COMMUNITY): Payer: Medicare Other

## 2019-11-28 DIAGNOSIS — R0789 Other chest pain: Secondary | ICD-10-CM | POA: Diagnosis not present

## 2019-11-28 DIAGNOSIS — R0781 Pleurodynia: Secondary | ICD-10-CM

## 2019-11-28 DIAGNOSIS — Z79899 Other long term (current) drug therapy: Secondary | ICD-10-CM | POA: Diagnosis not present

## 2019-11-28 DIAGNOSIS — K7030 Alcoholic cirrhosis of liver without ascites: Secondary | ICD-10-CM | POA: Diagnosis not present

## 2019-11-28 DIAGNOSIS — M25552 Pain in left hip: Secondary | ICD-10-CM | POA: Insufficient documentation

## 2019-11-28 DIAGNOSIS — E559 Vitamin D deficiency, unspecified: Secondary | ICD-10-CM | POA: Insufficient documentation

## 2019-11-28 MED ORDER — ACETAMINOPHEN 325 MG PO TABS
650.0000 mg | ORAL_TABLET | Freq: Once | ORAL | Status: AC
  Administered 2019-11-28: 650 mg via ORAL
  Filled 2019-11-28: qty 2

## 2019-11-28 NOTE — Discharge Instructions (Addendum)
You have been seen for left rib pain and hip pain.  I want you to alternate between taking ibuprofen and Tylenol every 6 hours for pain.  For example you can take Tylenol wait 6 hours then take ibuprofen wait another 6 hours and repeat.  I also want you to apply ice to the area as this can help with inflammation and swelling.  Do not place ice on bare skin as this can cause a burn.  Please try to limit movement for the first few days and allow adequate time to heal.  I want you to follow-up with your primary care doctor in 1 week's time if symptoms persist  I want to come back to emergency department if you develop shortness of breath, chest pain, increased swelling of your ribs, as these symptoms require other further evaluation.
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
## 2019-11-30 ENCOUNTER — Ambulatory Visit: Payer: Medicare Other | Admitting: Internal Medicine

## 2020-01-03 ENCOUNTER — Telehealth: Payer: Self-pay | Admitting: Internal Medicine

## 2020-01-03 NOTE — Telephone Encounter (Signed)
Copied from [place] [id number]. Topic: Appointment Scheduling - Scheduling Inquiry for Clinic >> Jan 03, 2020 10:59 AM Erick Blinks wrote: Reason for CRM: Pt called to change his appt tomorrow to a virtual audio only. Appt notes updated, please advise if in office is required. Pt will cancel if so

## 2020-01-04 ENCOUNTER — Ambulatory Visit: Payer: Medicare Other | Attending: Internal Medicine | Admitting: Internal Medicine

## 2020-01-04 ENCOUNTER — Encounter: Payer: Self-pay | Admitting: Internal Medicine

## 2020-01-04 ENCOUNTER — Other Ambulatory Visit: Payer: Self-pay

## 2020-01-04 VITALS — BP 141/88 | HR 76 | Resp 16 | Ht 70.0 in | Wt 183.2 lb

## 2020-01-04 DIAGNOSIS — K7460 Unspecified cirrhosis of liver: Secondary | ICD-10-CM | POA: Diagnosis not present

## 2020-01-04 DIAGNOSIS — I10 Essential (primary) hypertension: Secondary | ICD-10-CM | POA: Diagnosis not present

## 2020-01-04 DIAGNOSIS — Z0000 Encounter for general adult medical examination without abnormal findings: Secondary | ICD-10-CM | POA: Diagnosis not present

## 2020-01-04 DIAGNOSIS — Z23 Encounter for immunization: Secondary | ICD-10-CM | POA: Diagnosis not present

## 2020-01-04 DIAGNOSIS — R634 Abnormal weight loss: Secondary | ICD-10-CM

## 2020-01-04 DIAGNOSIS — F1010 Alcohol abuse, uncomplicated: Secondary | ICD-10-CM

## 2020-01-04 MED ORDER — NADOLOL 20 MG PO TABS: 20.0000 mg | ORAL_TABLET | Freq: Every day | ORAL | 3 refills | Status: DC

## 2020-01-04 NOTE — Patient Instructions (Addendum)
Your blood pressure is elevated.  We have started you on a medication called nadolol to take once daily.  Try to cut back on eating canned vegetables and canned fruits.  Try to eat more fresh vegetables and fresh fruits.   Healthy Eating Following a healthy eating pattern may help you to achieve and maintain a healthy body weight, reduce the risk of chronic disease, and live a long and productive life. It is important to follow a healthy eating pattern at an appropriate calorie level for your body. Your nutritional needs should be met primarily through food by choosing a variety of nutrient-rich foods. What are tips for following this plan? Reading food labels  Read labels and choose the following: ? Reduced or low sodium. ? Juices with 100% fruit juice. ? Foods with low saturated fats and high polyunsaturated and monounsaturated fats. ? Foods with whole grains, such as whole wheat, cracked wheat, brown rice, and wild rice. ? Whole grains that are fortified with folic acid. This is recommended for women who are pregnant or who want to become pregnant.  Read labels and avoid the following: ? Foods with a lot of added sugars. These include foods that contain brown sugar, corn sweetener, corn syrup, dextrose, fructose, glucose, high-fructose corn syrup, honey, invert sugar, lactose, malt syrup, maltose, molasses, raw sugar, sucrose, trehalose, or turbinado sugar.  Do not eat more than the following amounts of added sugar per day:  6 teaspoons (25 g) for women.  9 teaspoons (38 g) for men. ? Foods that contain processed or refined starches and grains. ? Refined grain products, such as white flour, degermed cornmeal, white bread, and white rice. Shopping  Choose nutrient-rich snacks, such as vegetables, whole fruits, and nuts. Avoid high-calorie and high-sugar snacks, such as potato chips, fruit snacks, and candy.  Use oil-based dressings and spreads on foods instead of solid fats such as  butter, stick margarine, or cream cheese.  Limit pre-made sauces, mixes, and "instant" products such as flavored rice, instant noodles, and ready-made pasta.  Try more plant-protein sources, such as tofu, tempeh, black beans, edamame, lentils, nuts, and seeds.  Explore eating plans such as the Mediterranean diet or vegetarian diet. Cooking  Use oil to saut or stir-fry foods instead of solid fats such as butter, stick margarine, or lard.  Try baking, boiling, grilling, or broiling instead of frying.  Remove the fatty part of meats before cooking.  Steam vegetables in water or broth. Meal planning   At meals, imagine dividing your plate into fourths: ? One-half of your plate is fruits and vegetables. ? One-fourth of your plate is whole grains. ? One-fourth of your plate is protein, especially lean meats, poultry, eggs, tofu, beans, or nuts.  Include low-fat dairy as part of your daily diet. Lifestyle  Choose healthy options in all settings, including home, work, school, restaurants, or stores.  Prepare your food safely: ? Wash your hands after handling raw meats. ? Keep food preparation surfaces clean by regularly washing with hot, soapy water. ? Keep raw meats separate from ready-to-eat foods, such as fruits and vegetables. ? Cook seafood, meat, poultry, and eggs to the recommended internal temperature. ? Store foods at safe temperatures. In general:  Keep cold foods at 61F (4.4C) or below.  Keep hot foods at 161F (60C) or above.  Keep your freezer at [facility] (-17.8C) or below.  Foods are no longer safe to eat when they have been between the temperatures of 40-161F (4.4-60C) for more than  2 hours. What foods should I eat? Fruits Aim to eat 2 cup-equivalents of fresh, canned (in natural juice), or frozen fruits each day. Examples of 1 cup-equivalent of fruit include 1 small apple, 8 large strawberries, 1 cup canned fruit,  cup dried fruit, or 1 cup 100%  juice. Vegetables Aim to eat 2-3 cup-equivalents of fresh and frozen vegetables each day, including different varieties and colors. Examples of 1 cup-equivalent of vegetables include 2 medium carrots, 2 cups raw, leafy greens, 1 cup chopped vegetable (raw or cooked), or 1 medium baked potato. Grains Aim to eat 6 ounce-equivalents of whole grains each day. Examples of 1 ounce-equivalent of grains include 1 slice of bread, 1 cup ready-to-eat cereal, 3 cups popcorn, or  cup cooked rice, pasta, or cereal. Meats and other proteins Aim to eat 5-6 ounce-equivalents of protein each day. Examples of 1 ounce-equivalent of protein include 1 egg, 1/2 cup nuts or seeds, or 1 tablespoon (16 g) peanut butter. A cut of meat or fish that is the size of a deck of cards is about 3-4 ounce-equivalents.  Of the protein you eat each week, try to have at least 8 ounces come from seafood. This includes salmon, trout, herring, and anchovies. Dairy Aim to eat 3 cup-equivalents of fat-free or low-fat dairy each day. Examples of 1 cup-equivalent of dairy include 1 cup (240 mL) milk, 8 ounces (250 g) yogurt, 1 ounces (44 g) natural cheese, or 1 cup (240 mL) fortified soy milk. Fats and oils  Aim for about 5 teaspoons (21 g) per day. Choose monounsaturated fats, such as canola and olive oils, avocados, peanut butter, and most nuts, or polyunsaturated fats, such as sunflower, corn, and soybean oils, walnuts, pine nuts, sesame seeds, sunflower seeds, and flaxseed. Beverages  Aim for six 8-oz glasses of water per day. Limit coffee to three to five 8-oz cups per day.  Limit caffeinated beverages that have added calories, such as soda and energy drinks.  Limit alcohol intake to no more than 1 drink a day for nonpregnant women and 2 drinks a day for men. One drink equals 12 oz of beer (355 mL), 5 oz of wine (148 mL), or 1 oz of hard liquor (44 mL). Seasoning and other foods  Avoid adding excess amounts of salt to your foods.  Try flavoring foods with herbs and spices instead of salt.  Avoid adding sugar to foods.  Try using oil-based dressings, sauces, and spreads instead of solid fats. This information is based on general [place] nutrition guidelines. For more information, visit [employer].es. Exact amounts may vary based on your nutrition needs. Summary  A healthy eating plan may help you to maintain a healthy weight, reduce the risk of chronic diseases, and stay active throughout your life.  Plan your meals. Make sure you eat the right portions of a variety of nutrient-rich foods.  Try baking, boiling, grilling, or broiling instead of frying.  Choose healthy options in all settings, including home, work, school, restaurants, or stores. This information is not intended to replace advice given to you by your health care provider. Make sure you discuss any questions you have with your health care provider. Document Revised: 09/08/2017 Document Reviewed: 09/08/2017 Elsevier Patient Education  [place].

## 2020-01-04 NOTE — Progress Notes (Addendum)
Subjective:   Luis Morrison is a 64 y.o. male who presents for an Initial Medicare Annual Wellness Visit. Hx Hep Cstatus post successful treatment with Harvoni,cirrhosis, GERD,Vit D def, ETOH abuse, preDM  Review of Systems    elev BP again today as on last visit.  Limits salt in foods but tells me he eats mainly can veggies.  no device to check BP at home Loss 12 lbs since 08/2019.  Walking 3 x a wk for 45 mins.  Eating better and eating less.  Reports he has cut back signifantly on fried foods.  Eats a pre-package frozen meal for dinner most nights.   Eats can fruits and veggies Pulmonary: Denies any chronic cough.  No shortness of breath. GI: No abdominal pain.  No problems swallowing.  He is moving his bowels okay.  No blood in the stools. GU: Passing his urine okay.  No blood in the urine.      Objective:    Today's Vitals   01/04/20 1500  BP: (!) 141/88  Pulse: 76  Resp: 16  SpO2: 97%  Weight: 183 lb 3.2 oz (83.1 kg)  Height: 5\' 10"  (1.778 m)   Body mass index is 26.29 kg/m.  Wt Readings from Last 3 Encounters:  01/04/20 183 lb 3.2 oz (83.1 kg)  11/28/19 185 lb (83.9 kg)  09/02/19 195 lb 6.4 oz (88.6 kg)   Repeat BP 150/88 General: Older African-American male in NAD. HEENT: No cervical lymphadenopathy.  No thyromegaly or thyroid nodules palpated. Chest: Clear to auscultation bilaterally. CVS: Regular rate rhythm no gallops or murmurs heard. Abdomen: Normal bowel sounds, nondistended, soft and nontender.  No hepatosplenomegaly. Extremities: No lower extremity edema. Advanced Directives 01/04/2020 12/20/2018 12/20/2018 05/08/2017 03/06/2017 01/03/2017 01/02/2017  Does Patient Have a Medical Advance Directive? No No No No No No No  Would patient like information on creating a medical advance directive? No - Patient declined No - Patient declined No - Patient declined - - No - Patient declined No - Patient declined    Current Medications (verified) Outpatient Encounter  Medications as of 01/04/2020  Medication Sig  . ergocalciferol (VITAMIN D2) 1.25 MG (50000 UT) capsule Take 1 capsule (50,000 Units total) by mouth once a week. Vit D deficiency - E55.9  . sildenafil (VIAGRA) 50 MG tablet 1 tab p.o. as needed half hour prior to intercourse.  Limit use to 1 tab/24 hours  . traZODone (DESYREL) 50 MG tablet Take 0.5-1 tablets (25-50 mg total) by mouth at bedtime as needed for sleep. (Patient not taking: Reported on 09/02/2019)   No facility-administered encounter medications on file as of 01/04/2020.    Allergies (verified) Patient has no known allergies.   History: Past Medical History:  Diagnosis Date  . Cirrhosis ([place]) 10/09/2014   No past surgical history on file. Family History  Problem Relation Age of Onset  . Cancer Mother   . Brain cancer Mother   . Colon cancer Neg Hx   . Colon polyps Neg Hx   . Esophageal cancer Neg Hx   . Rectal cancer Neg Hx   . Stomach cancer Neg Hx    Social History   Socioeconomic History  . Marital status: Single    Spouse name: Not on file  . Number of children: Not on file  . Years of education: Not on file  . Highest education level: Not on file  Occupational History  . Not on file  Tobacco Use  . Smoking status: Never  Smoker  . Smokeless tobacco: Never Used  Substance and Sexual Activity  . Alcohol use: Yes    Alcohol/week: 2.0 standard drinks    Types: 2 Cans of beer per week  . Drug use: No  . Sexual activity: Not on file  Other Topics Concern  . Not on file  Social History Narrative  . Not on file   Social Determinants of Health   Financial Resource Strain:   . Difficulty of Paying Living Expenses:   Food Insecurity:   . Worried About Charity fundraiser in the Last Year:   . Arboriculturist in the Last Year:   Transportation Needs:   . Film/video editor (Medical):   Marland Kitchen Lack of Transportation (Non-Medical):   Physical Activity:   . Days of Exercise per Week:   . Minutes of Exercise  per Session:   Stress:   . Feeling of Stress :   Social Connections:   . Frequency of Communication with Friends and Family:   . Frequency of Social Gatherings with Friends and Family:   . Attends Religious Services:   . Active Member of Clubs or Organizations:   . Attends Archivist Meetings:   Marland Kitchen Marital Status:     Tobacco Counseling Never a smoker  Alcohol use:   On last visit, he reported drinking 4 to 5 glasses of mixed drinks and bourbon on the weekends.  Completely or at least cut back to no more than 2 standard drinks a day.  He tells me that he now drinks a 12 pk beer a wk.  Clinical Intake: Pain : No/denies pain   Diabetes: No   Activities of Daily Living In your present state of health, do you have any difficulty performing the following activities: 01/04/2020  Hearing? N  Vision? N  Difficulty concentrating or making decisions? N  Walking or climbing stairs? N  Dressing or bathing? N  Doing errands, shopping? N  Preparing Food and eating ? N  Using the Toilet? N  In the past six months, have you accidently leaked urine? N  Do you have problems with loss of bowel control? N  Managing your Medications? N  Managing your Finances? N  Housekeeping or managing your Housekeeping? N  Some recent data might be hidden    Patient Care Team: Ladell Pier, MD as PCP - General (Internal Medicine) Dr. Schuyler Amor - Ophthalmology Dr. Wilfrid Lund - [facility]  Indicate any recent Medical Services you may have received from other than [facility] providers in the past year (date may be approximate). Eye exam by Dr. Schuyler Amor     Assessment:   This is a routine wellness examination for Luis Morrison.  Hearing/Vision screen: Whisper test was perform and was nl  Hearing Screening   125Hz  250Hz  500Hz  1000Hz  2000Hz  3000Hz  4000Hz  6000Hz  8000Hz   Right ear:           Left ear:             Visual Acuity Screening   Right eye Left eye Both eyes  Without correction:     With  correction: 20 20 20 20 20 20     Dietary issues and exercise activities discussed: Loss 12 lbs since 08/2019.  Walking 3 x a wk for 45 mins.  Eating better and eating less.  Reports he has cut back signifantly on fried foods.  Eats a pre-package frozen meal for dinner most nights.   Eats can fruits and veggies -We  discussed having him eat more fresh fruits and vegetables rather than canned fruits and vegetables.  Canned vegetables tend to contain more salt as preservative.  Canned fruits contain small sugar.  Goals   None    Depression Screen PHQ 2/9 Scores 01/04/2020 01/20/2019 06/18/2018 10/09/2017 05/30/2017 04/18/2017 02/25/2017  PHQ - 2 Score 0 0 0 0 0 0 0  PHQ- 9 Score - 0 - - 1 0 2    Fall Risk Fall Risk  01/04/2020 08/06/2016 01/23/2016 12/06/2015 09/15/2015  Falls in the past year? 0 No No No No  Number falls in past yr: 0 - - - -  Injury with Fall? 0 - - - -    Any stairs in or around the home? Yes   If so, are there any without handrails? Yes  yes Home free of loose throw rugs in walkways, pet beds, electrical cords, etc? Yes  Adequate lighting in your home to reduce risk of falls? Yes   ASSISTIVE DEVICES UTILIZED TO PREVENT FALLS:  Life alert? No  Patient does not feel he needs one at this time. Use of a cane, walker or w/c? No  Grab bars in the bathroom? Yes   Shower chair or bench in shower?  No and he does not think he needs one as yet. Elevated toilet seat or a handicapped toilet? Yes    TIMED UP AND GO:  Was the test performed? Yes .  Length of time to ambulate 10 feet: 7 sec.   Gait steady and fast without use of assistive device  Cognitive Function: MMSE - Mini Mental State Exam 01/04/2020  Orientation to time 5  Orientation to Place 5  Registration 3  Attention/ Calculation 0  Recall 3  Language- name 2 objects 2  Language- repeat 1  Language- follow 3 step command 3  Language- read & follow direction 1  Write a sentence 1  Copy design 1  Total score 25         Immunizations Immunization History  Administered Date(s) Administered  . Hepatitis B, adult 01/23/2016, 02/26/2016  . Influenza,inj,Quad PF,6+ Mos 02/25/2017, 04/20/2018, 05/04/2019  . Pneumococcal Conjugate-13 05/04/2019  . Tdap 12/06/2015, 12/20/2018    TDAP status: Up to date Flu Vaccine status: Up to date Pneumococcal vaccine status: Up to date Covid-19 vaccine status: Information provided on how to obtain vaccines.     Qualifies for Shingles Vaccine? Yes   Zostavax completed No   Shingrix Completed?: No.    Education has been provided regarding the importance of this vaccine. Patient has been advised to call insurance company to determine out of pocket expense if they have not yet received this vaccine. Advised may also receive vaccine at local pharmacy or Health Dept. Verbalized acceptance and understanding.   Pt declines getting vaccine  Screening Tests Health Maintenance  Topic Date Due  . COVID-19 Vaccine (1) Never done  . INFLUENZA VACCINE  01/09/2020  . PNA vac Low Risk Adult (2 of 2 - PPSV23) 05/03/2020  . COLONOSCOPY  05/09/2027  . TETANUS/TDAP  12/19/2028  . Hepatitis C Screening  Completed    Health Maintenance  Health Maintenance Due  Topic Date Due  . COVID-19 Vaccine (1) Never done    Colorectal cancer screening: Completed 05/08/2017. Repeat every 10 years  Lung Cancer Screening: (Low Dose CT Chest recommended if Age 83-80 years, 30 pack-year currently smoking OR have quit w/in 15years.) does not qualify.   Lung Cancer Screening Referral: NA  Additional Screening:  Hepatitis C Screening: does qualify; Completed pt has hx of hep C that has been treated  Vision Screening: Recommended annual ophthalmology exams for early detection of glaucoma and other disorders of the eye. Is the patient up to date with their annual eye exam?  Yes  Who is the provider or what is the name of the office in which the patient attends annual eye exams? Dr.  Schuyler Amor.  Had eye exam several mths ago and was prescribed new glasses If pt is not established with a provider, would they like to be referred to a provider to establish care? NA.   Dental Screening: Recommended annual dental exams for proper oral hygiene Has dentures above and below.  He has a Pharmacist, community but does not recall the name of the provider or the practice.  Community Resource Referral / Chronic Care Management: CRR required this visit?  No   CCM required this visit?  No      Plan:    1. Encounter for Medicare annual wellness exam  2. Essential hypertension Persist in elev on recheck today. DASH diet discussed and encouraged.  Advised to eat more fresh veggies instead of can veggies Start Nadolol - nadolol (CORGARD) 20 MG tablet; Take 1 tablet (20 mg total) by mouth daily.  Dispense: 30 tablet; Refill: 3  3. Need for shingles vaccine -recommended and advised but pt declined  4. Cirrhosis of liver without ascites, unspecified hepatic cirrhosis type ([place]) Due for repeat US to screen for [facility] - US Abdomen Limited; Future  5. Weight loss Pt feels it is intentional and reports better eating habits and moving more to explain  6. Alcohol use disorder, mild, abuse Commended him on cutting back but I think pt may still be understating the amount that he consumes.  Discussed the importance of cutting back even more to helppreserve liver function as long as he can.  I have personally reviewed and noted the following in the patient's chart:   . Medical and social history . Use of alcohol, tobacco or illicit drugs  . Current medications and supplements . Functional ability and status . Nutritional status . Physical activity . Advanced directives . List of other physicians . Hospitalizations, surgeries, and ER visits in previous 12 months . Vitals . Screenings to include cognitive, depression, and falls . Referrals and appointments  In addition, I have reviewed and discussed  with patient certain preventive protocols, quality metrics, and best practice recommendations. A written personalized care plan for preventive services as well as general preventive health recommendations were provided to patient.     Karle Plumber, MD   01/04/2020

## 2020-01-10 ENCOUNTER — Ambulatory Visit (HOSPITAL_COMMUNITY): Payer: Medicare Other

## 2020-04-28 ENCOUNTER — Other Ambulatory Visit: Payer: Self-pay | Admitting: Internal Medicine

## 2020-06-26 ENCOUNTER — Ambulatory Visit: Payer: Medicare Other | Attending: Internal Medicine | Admitting: Internal Medicine

## 2020-06-26 ENCOUNTER — Other Ambulatory Visit: Payer: Self-pay

## 2020-06-26 DIAGNOSIS — N529 Male erectile dysfunction, unspecified: Secondary | ICD-10-CM

## 2020-06-26 DIAGNOSIS — Z289 Immunization not carried out for unspecified reason: Secondary | ICD-10-CM

## 2020-06-26 DIAGNOSIS — K7460 Unspecified cirrhosis of liver: Secondary | ICD-10-CM | POA: Diagnosis not present

## 2020-06-26 DIAGNOSIS — J069 Acute upper respiratory infection, unspecified: Secondary | ICD-10-CM | POA: Diagnosis not present

## 2020-06-26 DIAGNOSIS — F1020 Alcohol dependence, uncomplicated: Secondary | ICD-10-CM

## 2020-06-26 DIAGNOSIS — Z87898 Personal history of other specified conditions: Secondary | ICD-10-CM

## 2020-06-26 MED ORDER — SILDENAFIL CITRATE 50 MG PO TABS: ORAL_TABLET | ORAL | 1 refills | Status: DC

## 2020-06-26 NOTE — Progress Notes (Signed)
Virtual Visit via Telephone Note  I connected with Luis Morrison on 06/26/20 at 10:51 a.m by telephone and verified that I am speaking with the correct person using two identifiers.  Location: Patient: home Provider: office  The patient, my CMA Ms. Sallyanne Havers and myself participated in this visit. I discussed the limitations, risks, security and privacy concerns of performing an evaluation and management service by telephone and the availability of in person appointments. I also discussed with the patient that there may be a patient responsible charge related to this service. The patient expressed understanding and agreed to proceed.   History of Present Illness: Hx Hep Cstatus post successful treatment with Harvoni,cirrhosis with history of esophageal varices, GERD,Vit D def, ETOH abuse, preDM.  Patient was last seen July/2021.  Pt c/o having a cold x 1 wk. Symptoms include dry cough and raspy voice.  No nasal or chest congestion, sore throat, SOB, fever, loss taste or swell.  No problems swallowing.  No body aches.  No recent sick contacts. Stayed at home for 5 days last wk and felt he was getting better but went outside in cold and symptoms returned. Took some Nyquil.  Never got COVID vaccine.  Thinks he may get it once weather is better.  Cirrhosis/hep C treated/ETOH disorder:  I had ordered US liver on last visit for [facility] screening.  He did not have it done. Agreeable to have it rescheduled.  -reports he has slowed down on drinking because his license has been suspended for likely DWI.  Has court date 07/13/2020.  Last drank 2-4 beers this past Friday. Reports only time he drinks is when he is around friends.  Drank since age 65. He feels he can cut back but not able to quit completely.   Request RF on Viagra.   HM: Completed his Medicare wellness visit 12/2019.  Due for flu vaccine, Pneumovax and COVID-vaccine.  Outpatient Encounter Medications as of 06/26/2020  Medication Sig  .  ergocalciferol (VITAMIN D2) 1.25 MG (50000 UT) capsule Take 1 capsule (50,000 Units total) by mouth once a week. Vit D deficiency - E55.9  . nadolol (CORGARD) 20 MG tablet Take 1 tablet (20 mg total) by mouth daily.  . sildenafil (VIAGRA) 50 MG tablet TAKE 1 TABLET BY MOUTH AS NEEDED HALF HOUR PRIOR TO INTERCOURSE LIMIT USE TO 1 TAB/24 HOURS  . traZODone (DESYREL) 50 MG tablet Take 0.5-1 tablets (25-50 mg total) by mouth at bedtime as needed for sleep. (Patient not taking: Reported on 09/02/2019)   No facility-administered encounter medications on file as of 06/26/2020.    Observations/Objective: No direct observation done as this was a telephone encounter.  Audibly, he has mild hoarseness.  Assessment and Plan: 1. Acute upper respiratory infection I told the patient that with the pandemic going on and the fact that he is not vaccinated, we have to assume that his symptoms may be due to [place] until proven otherwise.  He declines coming to have COVID testing done due to lack of transportation.  I advised him to quarantine for 5 to 7 days and until symptoms get better.  After that if he does go out in public, he should wear a mask and continue to practice social distancing.  Recommend use of over-the-counter Robitussin-DM for the cough and drinking warm fluids like tea to help with the hoarseness.  Follow-up if the hoarseness does not resolve.  2. Cirrhosis of liver without ascites, unspecified hepatic cirrhosis type [facility]) Discussed the importance of getting  the liver ultrasound every 1 to 2 years for [facility] screening.  He is agreeable to having my CMA reschedule the liver ultrasound that was ordered in July of last year. - CBC; Future - Comprehensive metabolic panel; Future  3. Alcoholism, chronic ([place]) Encouraged him to quit completely but if unable to do so, he should try not to exceed the recommendation of no more than 2 standard drinks per day for men.  Encouraged him to consider joining a support  group like AA and to reconsider who he chooses to hang out with since it sounds as though his friends may also have problems with alcohol.  4. Erectile dysfunction, unspecified erectile dysfunction type - sildenafil (VIAGRA) 50 MG tablet; TAKE 1 TABLET BY MOUTH AS NEEDED HALF HOUR PRIOR TO INTERCOURSE LIMIT USE TO 1 TAB/24 HOURS  Dispense: 10 tablet; Refill: 1  5. COVID-19 vaccination not done Strongly encouraged him to get the COVID-19 vaccine series to protect himself and his community.  Patient states that he will do so once he gets over the current upper viral syndrome that he has going on.  I have submitted his name to our vaccine clinic so that they can call him with an appointment.  6. History of prediabetes - Lipid panel; Future - Hemoglobin A1c; Future   Follow Up Instructions: Follow-up again in 4 months.   I discussed the assessment and treatment plan with the patient. The patient was provided an opportunity to ask questions and all were answered. The patient agreed with the plan and demonstrated an understanding of the instructions.   The patient was advised to call back or seek an in-person evaluation if the symptoms worsen or if the condition fails to improve as anticipated.  I provided 17 minutes of non-face-to-face time during this encounter.   Karle Plumber, MD

## 2020-07-14 ENCOUNTER — Other Ambulatory Visit: Payer: Medicare Other

## 2020-08-15 ENCOUNTER — Ambulatory Visit: Payer: Medicare Other

## 2020-08-18 ENCOUNTER — Ambulatory Visit: Payer: Medicare Other

## 2020-10-24 ENCOUNTER — Ambulatory Visit: Payer: Medicare Other | Admitting: Internal Medicine

## 2020-12-15 ENCOUNTER — Ambulatory Visit: Payer: Medicare Other | Admitting: Internal Medicine

## 2021-02-08 ENCOUNTER — Other Ambulatory Visit: Payer: Self-pay

## 2021-02-08 ENCOUNTER — Ambulatory Visit: Payer: Medicare Other | Attending: Internal Medicine | Admitting: Internal Medicine

## 2021-02-08 DIAGNOSIS — F1020 Alcohol dependence, uncomplicated: Secondary | ICD-10-CM | POA: Diagnosis not present

## 2021-02-08 DIAGNOSIS — Z23 Encounter for immunization: Secondary | ICD-10-CM

## 2021-02-08 DIAGNOSIS — I10 Essential (primary) hypertension: Secondary | ICD-10-CM | POA: Diagnosis not present

## 2021-02-08 DIAGNOSIS — K7030 Alcoholic cirrhosis of liver without ascites: Secondary | ICD-10-CM

## 2021-02-08 DIAGNOSIS — N529 Male erectile dysfunction, unspecified: Secondary | ICD-10-CM | POA: Diagnosis not present

## 2021-02-08 DIAGNOSIS — Z8619 Personal history of other infectious and parasitic diseases: Secondary | ICD-10-CM | POA: Diagnosis not present

## 2021-02-08 MED ORDER — SILDENAFIL CITRATE 50 MG PO TABS: ORAL_TABLET | ORAL | 6 refills | Status: DC

## 2021-02-08 MED ORDER — NADOLOL 20 MG PO TABS: 20.0000 mg | ORAL_TABLET | Freq: Every day | ORAL | 3 refills | Status: DC

## 2021-02-08 NOTE — Progress Notes (Signed)
Patient ID: Luis Morrison, male   DOB: [birth year], 65 y.o.   MRN: [id number] Virtual Visit via Telephone Note  I connected with Luis Morrison on 02/08/2021 at 2:17 p.m by telephone and verified that I am speaking with the correct person using two identifiers  Location: Patient: home Provider: office  Participants: Myself Patient   I discussed the limitations, risks, security and privacy concerns of performing an evaluation and management service by telephone and the availability of in person appointments. I also discussed with the patient that there may be a patient responsible charge related to this service. The patient expressed understanding and agreed to proceed.   History of Present Illness: Hx Hep C status post successful treatment with Harvoni, cirrhosis, GERD, Vit D def, ETOH abuse, preDM.  Patient was last seen 06/2020.  Patient had canceled appointments with me in May and July of this year.  HTN: Supposed to be on nadolol.  He took until he completed refills.  Out of med for several mths No CP/SOB/LE edema/HA/dizziness  ED: request RF on Sildenafil.  Works well for him  Hx of hep C with Cirrhosis: [place] of liver ordered 1 year ago for [facility] screening.  Patient did not show for the appointment. .  Had DWI last Sept. Went to court yesterday and was given fine and community service.  License suspended.  Reports he drinks only on wkends.  Pt vague about how much he drinks on wkends.  "I know I need to quit.  I will quit one day but not now."    He was supposed to come to the lab from earlier this year to have baseline blood tests done.  He states he will try to get by here next week to have it done.  He is also due for flu vaccine and pneumonia vaccine.  He states he will have those done at the same time when he comes to have his blood test. Outpatient Encounter Medications as of 02/08/2021  Medication Sig   ergocalciferol (VITAMIN D2) 1.25 MG (50000 UT) capsule Take 1 capsule (50,000 Units  total) by mouth once a week. Vit D deficiency - E55.9   nadolol (CORGARD) 20 MG tablet Take 1 tablet (20 mg total) by mouth daily.   sildenafil (VIAGRA) 50 MG tablet TAKE 1 TABLET BY MOUTH AS NEEDED HALF HOUR PRIOR TO INTERCOURSE LIMIT USE TO 1 TAB/24 HOURS   traZODone (DESYREL) 50 MG tablet Take 0.5-1 tablets (25-50 mg total) by mouth at bedtime as needed for sleep. (Patient not taking: Reported on 09/02/2019)   No facility-administered encounter medications on file as of 02/08/2021.      Observations/Objective: No direct observation done as this was a telephone visit.  Assessment and Plan: 1. Essential hypertension Refill sent on nadolol.  Encouraged him to take the medicine as prescribed. - nadolol (CORGARD) 20 MG tablet; Take 1 tablet (20 mg total) by mouth daily.  Dispense: 90 tablet; Refill: 3  2. Alcoholism, chronic ([place]) Strongly advised to try to cut back.  He is not interested in quitting but states he will try to cut back on his own..  Not interested in doing any rehab.  3. Erectile dysfunction, unspecified erectile dysfunction type - sildenafil (VIAGRA) 50 MG tablet; TAKE 1 TABLET BY MOUTH AS NEEDED HALF HOUR PRIOR TO INTERCOURSE LIMIT USE TO 1 TAB/24 HOURS  Dispense: 10 tablet; Refill: 6  4. Hepatitis C virus infection cured after antiviral drug therapy 5. Alcoholic cirrhosis of liver without ascites (  [place]) Will reorder the liver ultrasound  6. Need for influenza vaccination 7. Need for vaccination against Streptococcus pneumoniae Will have them schedule an appointment for him to see our clinical pharmacist to get his flu shot and Pneumovax 23.   Follow Up Instructions: 4 mths   I discussed the assessment and treatment plan with the patient. The patient was provided an opportunity to ask questions and all were answered. The patient agreed with the plan and demonstrated an understanding of the instructions.   The patient was advised to call back or seek an in-person  evaluation if the symptoms worsen or if the condition fails to improve as anticipated.  I  Spent 10 minutes on this telephone encounter  Karle Plumber, MD

## 2021-02-13 ENCOUNTER — Telehealth: Payer: Self-pay

## 2021-02-13 NOTE — Telephone Encounter (Addendum)
Contacted pt to go over appointment information.   Ultrasound Abdomen is scheduled for September 12 at 10am at [facility]. Pt will need to arrive at 945am.    Pt will need to be NPO(nothing to eat or dink after midnight)  Pt didn't answer left a detailed vm making pt aware of appt

## 2021-02-14 ENCOUNTER — Telehealth: Payer: Self-pay

## 2021-02-14 NOTE — Telephone Encounter (Signed)
-----   Message from Ladell Pier, MD sent at 02/08/2021  5:16 PM EDT ----- Follow-up in 4 months. Give appointment with Lurena Joiner in about 2 weeks to have flu vaccine and Pneumovax 23.

## 2021-02-14 NOTE — Telephone Encounter (Signed)
Pt has been scheduled for vaccine and pt has been informed.

## 2021-02-19 ENCOUNTER — Ambulatory Visit (HOSPITAL_COMMUNITY): Payer: Medicare Other | Attending: Internal Medicine

## 2021-02-19 ENCOUNTER — Other Ambulatory Visit: Payer: Self-pay

## 2021-02-19 ENCOUNTER — Ambulatory Visit: Payer: Medicare Other | Attending: Internal Medicine

## 2021-02-19 DIAGNOSIS — K7460 Unspecified cirrhosis of liver: Secondary | ICD-10-CM

## 2021-02-19 DIAGNOSIS — Z23 Encounter for immunization: Secondary | ICD-10-CM

## 2021-02-19 DIAGNOSIS — Z87898 Personal history of other specified conditions: Secondary | ICD-10-CM

## 2021-02-19 NOTE — Progress Notes (Signed)
Pt arrived at clinic for flu and pneumonia vaccine. Pt was given both vaccines in right deltoid. Pt tolerated shots well.

## 2021-02-19 NOTE — Patient Instructions (Signed)
Pneumococcal Conjugate Vaccine: What You Need to Know 1. Why get vaccinated? Pneumococcal conjugate vaccine can prevent pneumococcal disease. Pneumococcal disease refers to any illness caused by pneumococcal bacteria. These bacteria can cause many types of illnesses, including pneumonia, which is an infection of the lungs. Pneumococcal bacteria are one of the most common causes of pneumonia. Besides pneumonia, pneumococcal bacteria can also cause: Ear infections Sinus infections Meningitis (infection of the tissue covering the brain and spinal cord) Bacteremia (infection of the blood) Anyone can get pneumococcal disease, but children under 55 years old, people with certain medical conditions or other risk factors, and adults 40 years or older are at the highest risk. Most pneumococcal infections are mild. However, some can result in long-term problems, such as brain damage or hearing loss. Meningitis, bacteremia, and pneumonia caused by pneumococcal disease can be fatal. 2. Pneumococcal conjugate vaccine Pneumococcal conjugate vaccine helps protect against bacteria that cause pneumococcal disease. There are three pneumococcal conjugate vaccines (PCV13, PCV15, and PCV20). The different vaccines are recommended for different people based on their age and medical status. PCV13 Infants and young children usually need 4 doses of PCV13, at ages 5, 110, 48, and 12-15 months. Older children (through age 19 months) may be vaccinated with PCV13 if they did not receive the recommended doses. Children and adolescents 74-40 years of age with certain medical conditions should receive a single dose of PCV13 if they did not already receive PCV13. PCV15 or PCV20 Adults 42 through 67 years old with certain medical conditions or other risk factors who have not already received a pneumococcal conjugate vaccine should receive either: a single dose of PCV15 followed by a dose of pneumococcal polysaccharide vaccine (PPSV23),  or a single dose of PCV20. Adults 36 years or older who have not already received a pneumococcal conjugate vaccine should receive either: a single dose of PCV15 followed by a dose of PPSV23, or a single dose of PCV20. Your health care provider can give you more information. 3. Talk with your health care provider Tell your vaccination provider if the person getting the vaccine: Has had an allergic reaction after a previous dose of any type of pneumococcal conjugate vaccine (PCV13, PCV15, PCV20, or an earlier pneumococcal conjugate vaccine known as PCV7), or to any vaccine containing diphtheria toxoid (for example, DTaP), or has any severe, life-threatening allergies In some cases, your health care provider may decide to postpone pneumococcal conjugate vaccination until a future visit. People with minor illnesses, such as a cold, may be vaccinated. People who are moderately or severely ill should usually wait until they recover. Your health care provider can give you more information. 4. Risks of a vaccine reaction Redness, swelling, pain, or tenderness where the shot is given, and fever, loss of appetite, fussiness (irritability), feeling tired, headache, muscle aches, joint pain, and chills can happen after pneumococcal conjugate vaccination. Young children may be at increased risk for seizures caused by fever after PCV13 if it is administered at the same time as inactivated influenza vaccine. Ask your health care provider for more information. People sometimes faint after medical procedures, including vaccination. Tell your provider if you feel dizzy or have vision changes or ringing in the ears. As with any medicine, there is a very remote chance of a vaccine causing a severe allergic reaction, other serious injury, or death. 5. What if there is a serious problem? An allergic reaction could occur after the vaccinated person leaves the clinic. If you see signs of a severe allergic  reaction (hives,  swelling of the face and throat, difficulty breathing, a fast heartbeat, dizziness, or weakness), call 9-1-1 and get the person to the nearest hospital. For other signs that concern you, call your health care provider. Adverse reactions should be reported to the Vaccine Adverse Event Reporting System (VAERS). Your health care provider will usually file this report, or you can do it yourself. Visit the VAERS website at www.vaers.SamedayNews.es or call 325-729-9235. VAERS is only for reporting reactions, and VAERS staff members do not give medical advice. 6. The National Vaccine Injury Compensation Program The [employer] Vaccine Injury Compensation Program (VICP) is a federal program that was created to compensate people who may have been injured by certain vaccines. Claims regarding alleged injury or death due to vaccination have a time limit for filing, which may be as short as two years. Visit the VICP website at GoldCloset.com.ee or call (873)303-6106 to learn about the program and about filing a claim. 7. How can I learn more? Ask your health care provider. Call your local or state health department. Visit the website of the Food and Drug Administration (FDA) for vaccine package inserts and additional information at TraderRating.uy. Contact the Centers for Disease Control and Prevention (CDC): Call 574 172 8905 (1-800-CDC-INFO) or Visit CDC's website at http://hunter.com/. Vaccine Information Statement (Interim) Pneumococcal Conjugate Vaccine (07/14/2020) This information is not intended to replace advice given to you by your health care provider. Make sure you discuss any questions you have with your health care provider. Document Revised: 07/28/2020 Document Reviewed: 07/28/2020 Elsevier Patient Education  [place]. Influenza Virus Vaccine injection (Fluarix) What is this medication? INFLUENZA VIRUS VACCINE (in floo EN zuh VAHY ruhs vak SEEN)  helps to reduce the risk of getting influenza also known as the flu. This medicine may be used for other purposes; ask your health care provider or pharmacist if you have questions. COMMON BRAND NAME(S): Fluarix, Fluzone What should I tell my care team before I take this medication? They need to know if you have any of these conditions: bleeding disorder like hemophilia fever or infection Guillain-Barre syndrome or other neurological problems immune system problems infection with the human immunodeficiency virus (HIV) or AIDS low blood platelet counts multiple sclerosis an unusual or allergic reaction to influenza virus vaccine, eggs, chicken proteins, latex, gentamicin, other medicines, foods, dyes or preservatives pregnant or trying to get pregnant breast-feeding How should I use this medication? This vaccine is for injection into a muscle. It is given by a health care professional. A copy of Vaccine Information Statements will be given before each vaccination. Read this sheet carefully each time. The sheet may change frequently. Talk to your pediatrician regarding the use of this medicine in children. Special care may be needed. Overdosage: If you think you have taken too much of this medicine contact a poison control center or emergency room at once. NOTE: This medicine is only for you. Do not share this medicine with others. What if I miss a dose? This does not apply. What may interact with this medication? chemotherapy or radiation therapy medicines that lower your immune system like etanercept, anakinra, infliximab, and adalimumab medicines that treat or prevent blood clots like warfarin phenytoin steroid medicines like prednisone or cortisone theophylline vaccines This list may not describe all possible interactions. Give your health care provider a list of all the medicines, herbs, non-prescription drugs, or dietary supplements you use. Also tell them if you smoke, drink  alcohol, or use illegal drugs. Some items may interact  with your medicine. What should I watch for while using this medication? Report any side effects that do not go away within 3 days to your doctor or health care professional. Call your health care provider if any unusual symptoms occur within 6 weeks of receiving this vaccine. You may still catch the flu, but the illness is not usually as bad. You cannot get the flu from the vaccine. The vaccine will not protect against colds or other illnesses that may cause fever. The vaccine is needed every year. What side effects may I notice from receiving this medication? Side effects that you should report to your doctor or health care professional as soon as possible: allergic reactions like skin rash, itching or hives, swelling of the face, lips, or tongue Side effects that usually do not require medical attention (report to your doctor or health care professional if they continue or are bothersome): fever headache muscle aches and pains pain, tenderness, redness, or swelling at site where injected weak or tired This list may not describe all possible side effects. Call your doctor for medical advice about side effects. You may report side effects to FDA at 1-800-FDA-1088. Where should I keep my medication? This vaccine is only given in a clinic, pharmacy, doctor's office, or other health care setting and will not be stored at home. NOTE: This sheet is a summary. It may not cover all possible information. If you have questions about this medicine, talk to your doctor, pharmacist, or health care provider.  2022 Elsevier/Gold Standard (2007-12-23 09:30:40)

## 2021-02-20 ENCOUNTER — Telehealth: Payer: Self-pay

## 2021-02-20 LAB — CBC
Hematocrit: 44.7 % (ref 37.5–51.0)
Hemoglobin: 15 g/dL (ref 13.0–17.7)
MCH: 28.7 pg (ref 26.6–33.0)
MCHC: 33.6 g/dL (ref 31.5–35.7)
MCV: 86 fL (ref 79–97)
Platelets: 63 10*3/uL — CL (ref 150–450)
RBC: 5.23 x10E6/uL (ref 4.14–5.80)
RDW: 14.4 % (ref 11.6–15.4)
WBC: 4.1 10*3/uL (ref 3.4–10.8)

## 2021-02-20 LAB — COMPREHENSIVE METABOLIC PANEL
ALT: 28 IU/L (ref 0–44)
AST: 45 IU/L — ABNORMAL HIGH (ref 0–40)
Albumin/Globulin Ratio: 1 — ABNORMAL LOW (ref 1.2–2.2)
Albumin: 4.1 g/dL (ref 3.8–4.8)
Alkaline Phosphatase: 135 IU/L — ABNORMAL HIGH (ref 44–121)
BUN/Creatinine Ratio: 9 — ABNORMAL LOW (ref 10–24)
BUN: 9 mg/dL (ref 8–27)
Bilirubin Total: 0.5 mg/dL (ref 0.0–1.2)
CO2: 22 mmol/L (ref 20–29)
Calcium: 9 mg/dL (ref 8.6–10.2)
Chloride: 105 mmol/L (ref 96–106)
Creatinine, Ser: 1.02 mg/dL (ref 0.76–1.27)
Globulin, Total: 4.1 g/dL (ref 1.5–4.5)
Glucose: 102 mg/dL — ABNORMAL HIGH (ref 65–99)
Potassium: 4.2 mmol/L (ref 3.5–5.2)
Sodium: 141 mmol/L (ref 134–144)
Total Protein: 8.2 g/dL (ref 6.0–8.5)
eGFR: 81 mL/min/{1.73_m2} (ref >59)

## 2021-02-20 LAB — LIPID PANEL
Chol/HDL Ratio: 2.5 ratio (ref 0.0–5.0)
Cholesterol, Total: 174 mg/dL (ref 100–199)
HDL: 71 mg/dL (ref >39)
LDL Chol Calc (NIH): 87 mg/dL (ref 0–99)
Triglycerides: 88 mg/dL (ref 0–149)
VLDL Cholesterol Cal: 16 mg/dL (ref 5–40)

## 2021-02-20 LAB — HEMOGLOBIN A1C
Est. average glucose Bld gHb Est-mCnc: 117 mg/dL
Hgb A1c MFr Bld: 5.7 % — ABNORMAL HIGH (ref 4.8–5.6)

## 2021-02-20 NOTE — Telephone Encounter (Signed)
Contacted pt to go over lab results pt didn't answer lvm asking pt to give me a call at her/his earliest convenience   Sent a CRM and forward labs to NT to give pt labs when they call back

## 2021-02-20 NOTE — Progress Notes (Signed)
Let patient know that he is in the range for prediabetes.  Healthy eating habits and regular exercise will prevent progression to diabetes.  Cholesterol level normal.  Kidney function normal.  Mild elevation in liver enzymes that are stable.  Low platelet count likely due to cirrhosis of the liver.  Platelets are the blood cells that helps a blood clot normally.

## 2021-05-28 ENCOUNTER — Other Ambulatory Visit: Payer: Self-pay

## 2021-05-28 ENCOUNTER — Ambulatory Visit: Payer: Medicare Other | Admitting: Internal Medicine

## 2021-05-28 ENCOUNTER — Telehealth: Payer: Self-pay | Admitting: Internal Medicine

## 2021-05-28 ENCOUNTER — Encounter: Payer: Self-pay | Admitting: Internal Medicine

## 2021-05-28 ENCOUNTER — Ambulatory Visit: Payer: Medicare Other | Attending: Internal Medicine | Admitting: Internal Medicine

## 2021-05-28 VITALS — BP 140/82 | HR 85 | Ht 70.0 in | Wt 207.2 lb

## 2021-05-28 DIAGNOSIS — R051 Acute cough: Secondary | ICD-10-CM | POA: Diagnosis not present

## 2021-05-28 DIAGNOSIS — I10 Essential (primary) hypertension: Secondary | ICD-10-CM

## 2021-05-28 MED ORDER — BENZONATATE 100 MG PO CAPS: 100.0000 mg | ORAL_CAPSULE | Freq: Two times a day (BID) | ORAL | 0 refills | Status: DC | PRN

## 2021-05-28 MED ORDER — LORATADINE 10 MG PO TABS: 10.0000 mg | ORAL_TABLET | Freq: Every day | ORAL | 0 refills | Status: DC

## 2021-05-28 MED ORDER — NADOLOL 20 MG PO TABS: 20.0000 mg | ORAL_TABLET | Freq: Every day | ORAL | 3 refills | Status: DC

## 2021-05-28 NOTE — Progress Notes (Signed)
Patient ID: [name], male    DOB: [birth year]  MRN: [id number]  CC: Cough   Subjective: Luis Morrison is a 65 y.o. male who presents for UC visit His concerns today include:  Hx Hep C status post successful treatment with Harvoni, cirrhosis, GERD, Vit D def, ETOH abuse, preDM.  Pt c/o having wet nonproductive cough x 1 wk that is worse at nights. Associated with sneezing and wheezing a little at nights No chest or sinus congestion. No sore throat.  No SOB.  Felt a little warm at times. He has not had COVID vaccines.  Had flu shot in 02/2021.  No recent sick contacts BP elev.  Suppose to be on Nadolol but never picked up rxn from when I sent rxn on last visit in Sept 2022.  Patient Active Problem List   Diagnosis Date Noted   Alcohol abuse with other alcohol-induced disorder ([place]) 09/02/2019   Multiple fractures of ribs, right side, init for clos fx 12/20/2018   Thrombocytopenia ([place]) 12/20/2018   Pneumothorax on right 12/20/2018   Compensated HCV cirrhosis ([place]) 12/20/2018   Cortical age-related cataract of both eyes 10/09/2017   Secondary esophageal varices without bleeding ([place]) 05/30/2017   Prediabetes 02/26/2017   Alcohol abuse 08/06/2016   Vitamin D deficiency 12/06/2015   Cirrhosis of liver without ascites ([place]) 10/09/2014   Hepatitis C virus infection cured after antiviral drug therapy 10/09/2014     Current Outpatient Medications on File Prior to Visit  Medication Sig Dispense Refill   ergocalciferol (VITAMIN D2) 1.25 MG (50000 UT) capsule Take 1 capsule (50,000 Units total) by mouth once a week. Vit D deficiency - E55.9 12 capsule 1   sildenafil (VIAGRA) 50 MG tablet TAKE 1 TABLET BY MOUTH AS NEEDED HALF HOUR PRIOR TO INTERCOURSE LIMIT USE TO 1 TAB/24 HOURS 10 tablet 6   No current facility-administered medications on file prior to visit.    No Known Allergies  Social History   Socioeconomic History   Marital status: Single    Spouse name: Not on file    Number of children: Not on file   Years of education: Not on file   Highest education level: Not on file  Occupational History   Not on file  Tobacco Use   Smoking status: Never   Smokeless tobacco: Never  Substance and Sexual Activity   Alcohol use: Yes    Alcohol/week: 2.0 standard drinks    Types: 2 Cans of beer per week   Drug use: No   Sexual activity: Not on file  Other Topics Concern   Not on file  Social History Narrative   Not on file   Social Determinants of Health   Financial Resource Strain: Not on file  Food Insecurity: Not on file  Transportation Needs: Not on file  Physical Activity: Not on file  Stress: Not on file  Social Connections: Not on file  Intimate Partner Violence: Not on file    Family History  Problem Relation Age of Onset   Cancer Mother    Brain cancer Mother    Colon cancer Neg Hx    Colon polyps Neg Hx    Esophageal cancer Neg Hx    Rectal cancer Neg Hx    Stomach cancer Neg Hx     No past surgical history on file.  ROS: Review of Systems Negative except as stated above  PHYSICAL EXAM: BP 140/82    Pulse 85    Ht 5\' 10"  (  1.778 m)    Wt 207 lb 3.2 oz (94 kg)    SpO2 98%    BMI 29.73 kg/m   Physical Exam  General appearance - alert, well appearing, older African-American male and in no distress Mental status - normal mood, behavior, speech, dress, motor activity, and thought processes Nose - normal and patent, no erythema, discharge or polyps Mouth - mucous membranes moist, pharynx normal without lesions Neck - supple, no significant adenopathy Chest - clear to auscultation, no wheezes, rales or rhonchi, symmetric air entry Heart - normal rate, regular rhythm, normal S1, S2, no murmurs, rubs, clicks or gallops   CMP Latest Ref Rng & Units 02/19/2021 09/02/2019 05/04/2019  Glucose 65 - 99 mg/dL 102(H) 84 -  BUN 8 - 27 mg/dL 9 9 -  Creatinine 0.76 - 1.27 mg/dL 1.02 0.97 -  Sodium 134 - 144 mmol/L 141 141 -  Potassium 3.5 -  5.2 mmol/L 4.2 3.9 -  Chloride 96 - 106 mmol/L 105 106 -  CO2 20 - 29 mmol/L 22 22 -  Calcium 8.6 - 10.2 mg/dL 9.0 8.5(L) -  Total Protein 6.0 - 8.5 g/dL 8.2 7.3 7.9  Total Bilirubin 0.0 - 1.2 mg/dL 0.5 0.5 1.1  Alkaline Phos 44 - 121 IU/L 135(H) 131(H) 178(H)  AST 0 - 40 IU/L 45(H) 46(H) 48(H)  ALT 0 - 44 IU/L 28 29 36   Lipid Panel     Component Value Date/Time   CHOL 174 02/19/2021 1404   TRIG 88 02/19/2021 1404   HDL 71 02/19/2021 1404   CHOLHDL 2.5 02/19/2021 1404   LDLCALC 87 02/19/2021 1404    CBC    Component Value Date/Time   WBC 4.1 02/19/2021 1404   WBC 6.6 12/21/2018 0238   RBC 5.23 02/19/2021 1404   RBC 4.73 12/21/2018 0238   HGB 15.0 02/19/2021 1404   HCT 44.7 02/19/2021 1404   PLT 63 (LL) 02/19/2021 1404   MCV 86 02/19/2021 1404   MCH 28.7 02/19/2021 1404   MCH 29.2 12/21/2018 0238   MCHC 33.6 02/19/2021 1404   MCHC 32.5 12/21/2018 0238   RDW 14.4 02/19/2021 1404   LYMPHSABS 1.0 01/20/2019 1529   MONOABS 357 07/30/2016 1126   EOSABS 0.0 01/20/2019 1529   BASOSABS 0.0 01/20/2019 1529    ASSESSMENT AND PLAN:  1. Acute cough Likely viral illness that is mild versus allergies.  I have placed him on Tessalon Perles and Claritin.  We will check COVID test. - Novel Coronavirus, NAA ([facility])  2. Essential hypertension Not at goal.  Encouraged him to pick up the prescription for the nadolol that was sent to his pharmacy.   Patient was given the opportunity to ask questions.  Patient verbalized understanding of the plan and was able to repeat key elements of the plan.   Orders Placed This Encounter  Procedures   Novel Coronavirus, NAA ([facility])     Requested Prescriptions    No prescriptions requested or ordered in this encounter    Return in about 4 months (around 09/26/2021).  Karle Plumber, MD, FACP

## 2021-05-28 NOTE — Telephone Encounter (Signed)
Pt called in stating his medications were sent to the wrong pharmacy, Pt is requesting medications be sent to [facility] on [address]

## 2021-05-28 NOTE — Telephone Encounter (Signed)
Rx has been resent to the correct pharmacy

## 2021-05-29 LAB — NOVEL CORONAVIRUS, NAA: SARS-CoV-2, NAA: NOT DETECTED

## 2021-05-29 LAB — SARS-COV-2, NAA 2 DAY TAT

## 2021-05-31 ENCOUNTER — Telehealth: Payer: Self-pay

## 2021-05-31 NOTE — Telephone Encounter (Signed)
Contacted pt to go over lab results pt is aware and doesn't have any questions or concerns 

## 2021-09-06 ENCOUNTER — Ambulatory Visit: Payer: Self-pay | Admitting: *Deleted

## 2021-09-06 NOTE — Telephone Encounter (Signed)
?  Chief Complaint: numbness in left hand  ?Symptoms: left hand with mild swelling, numbness, pain unable to pick up object due to pain  ?Frequency: x 4 days  ?Pertinent Negatives: Patient denies , chest pain difficulty breathing, no N/T in left arm, leg, no visual deficits, no speech issues. Denies other neurological deficits. ?Disposition: '[x]'$ ED /'[]'$ Urgent Care (no appt availability in office) / '[]'$ Appointment(In office/virtual)/ '[]'$  [place] [facility]/ '[]'$ Home Care/ '[x]'$ Refused Recommended Disposition /'[]'$ [place] [facility]/ '[]'$  Follow-up with PCP ?Additional Notes:  ? ?Recommended ED. Patient reports he does not have transportation and wants to wait until Monday and see if sx get better. Please advise.  ? ? ? Reason for Disposition ? [1] Numbness (i.e., loss of sensation) of the face, arm / hand, or leg / foot on one side of the body AND [2] gradual onset (e.g., days to weeks) AND [3] present now ? ?Answer Assessment - Initial Assessment Questions ?1. SYMPTOM: "What is the main symptom you are concerned about?" (e.g., weakness, numbness) ?    N/T left hand  ?2. ONSET: "When did this start?" (minutes, hours, days; while sleeping) ?    4 days ago  ?3. LAST NORMAL: "When was the last time you (the patient) were normal (no symptoms)?" ?    4 days ago  ?4. PATTERN "Does this come and go, or has it been constant since it started?"  "Is it present now?" ?    Constant present now  ?5. CARDIAC SYMPTOMS: "Have you had any of the following symptoms: chest pain, difficulty breathing, palpitations?" ?    Denies  ?6. NEUROLOGIC SYMPTOMS: "Have you had any of the following symptoms: headache, dizziness, vision loss, double vision, changes in speech, unsteady on your feet?" ?    Denies  ?7. OTHER SYMPTOMS: "Do you have any other symptoms?" ? ?Unable to hold an object in left hand ? ?Protocols used: Neurologic Deficit-A-AH ? ?

## 2021-09-07 NOTE — Telephone Encounter (Signed)
Contacted pt to schedule an appt. Pt states he got some ice and placed ands in it and they are better. Pt states he doesn't know if he slept on it wrong or what but his hands are much better  ?

## 2021-09-25 ENCOUNTER — Ambulatory Visit: Payer: Medicare Other | Attending: Internal Medicine

## 2021-09-25 DIAGNOSIS — Z0000 Encounter for general adult medical examination without abnormal findings: Secondary | ICD-10-CM | POA: Diagnosis not present

## 2021-09-25 NOTE — Progress Notes (Signed)
? ?Subjective:  ? Luis Morrison is a 66 y.o. male who presents for Medicare Annual/Subsequent preventive examination. ? ?Patient visit virtually in the context of Covid-19 pandemic. ? ? I connected with Luis Morrison on 09/25/2021 at 3:54pm by telephone and verified that I am speaking with the correct person using two identifiers. ?I discussed the limitations, risks, security and privacy concerns of performing an evaluation and management service by telephone and the availability of in person appointments. I also discussed with the patient that there may be a patient responsible charge related to this service. The patient expressed understanding and agreed to proceed. ?Patient location:  Home ?My Location:  provider office  ?Persons on the telephone call:  patient and myself  ? ?Review of Systems    ? ?  ? ?   ?Objective:  ?  ?There were no vitals filed for this visit. ?There is no height or weight on file to calculate BMI. ? ? ?  09/25/2021  ?  3:55 PM 01/04/2020  ?  3:01 PM 12/20/2018  ?  3:50 AM 12/20/2018  ?  1:23 AM 05/08/2017  ?  9:40 AM 03/06/2017  ? 10:01 AM 01/03/2017  ?  8:38 AM  ?Advanced Directives  ?Does Patient Have a Medical Advance Directive? No No No No No No No  ?Would patient like information on creating a medical advance directive? No - Patient declined No - Patient declined No - Patient declined No - Patient declined   No - Patient declined  ? ? ?Current Medications (verified) ?Outpatient Encounter Medications as of 09/25/2021  ?Medication Sig  ? sildenafil (VIAGRA) 50 MG tablet TAKE 1 TABLET BY MOUTH AS NEEDED HALF HOUR PRIOR TO INTERCOURSE LIMIT USE TO 1 TAB/24 HOURS  ? benzonatate (TESSALON) 100 MG capsule Take 1 capsule (100 mg total) by mouth 2 (two) times daily as needed for cough. (Patient not taking: Reported on 09/25/2021)  ? ergocalciferol (VITAMIN D2) 1.25 MG (50000 UT) capsule Take 1 capsule (50,000 Units total) by mouth once a week. Vit D deficiency - E55.9 (Patient not taking: Reported on  09/25/2021)  ? loratadine (CLARITIN) 10 MG tablet Take 1 tablet (10 mg total) by mouth daily. (Patient not taking: Reported on 09/25/2021)  ? nadolol (CORGARD) 20 MG tablet Take 1 tablet (20 mg total) by mouth daily. (Patient not taking: Reported on 09/25/2021)  ? ?No facility-administered encounter medications on file as of 09/25/2021.  ? ? ?Allergies (verified) ?Patient has no known allergies.  ? ?History: ?Past Medical History:  ?Diagnosis Date  ? Cirrhosis ([place]) 10/09/2014  ? ?No past surgical history on file. ?Family History  ?Problem Relation Age of Onset  ? Cancer Mother   ? Brain cancer Mother   ? Colon cancer Neg Hx   ? Colon polyps Neg Hx   ? Esophageal cancer Neg Hx   ? Rectal cancer Neg Hx   ? Stomach cancer Neg Hx   ? ?Social History  ? ?Socioeconomic History  ? Marital status: Single  ?  Spouse name: Not on file  ? Number of children: Not on file  ? Years of education: Not on file  ? Highest education level: Not on file  ?Occupational History  ? Not on file  ?Tobacco Use  ? Smoking status: Never  ? Smokeless tobacco: Never  ?Substance and Sexual Activity  ? Alcohol use: Yes  ?  Alcohol/week: 2.0 standard drinks  ?  Types: 2 Cans of beer per week  ? Drug use: No  ?  Sexual activity: Not on file  ?Other Topics Concern  ? Not on file  ?Social History Narrative  ? Not on file  ? ?Social Determinants of Health  ? ?Financial Resource Strain: Not on file  ?Food Insecurity: Not on file  ?Transportation Needs: Not on file  ?Physical Activity: Not on file  ?Stress: Not on file  ?Social Connections: Not on file  ? ? ?Tobacco Counseling ?Counseling given: Not Answered ? ? ?Clinical Intake: ? ?  ? ?Pain : No/denies pain ? ?  ? ?Diabetes: No ? ?  ? ?Diabetic?No ? ?  ? ?  ? ? ?Activities of Daily Living ? ?  09/25/2021  ?  3:55 PM  ?In your present state of health, do you have any difficulty performing the following activities:  ?Hearing? 0  ?Vision? 0  ?Difficulty concentrating or making decisions? 0  ?Walking or climbing  stairs? 0  ?Dressing or bathing? 0  ?Doing errands, shopping? 0  ?Preparing Food and eating ? N  ?Using the Toilet? N  ?In the past six months, have you accidently leaked urine? N  ?Do you have problems with loss of bowel control? N  ?Managing your Medications? N  ?Managing your Finances? N  ?Housekeeping or managing your Housekeeping? N  ? ? ?Patient Care Team: ?Ladell Pier, MD as PCP - General (Internal Medicine) ? ?Indicate any recent Medical Services you may have received from other than [facility] providers in the past year (date may be approximate). ? ?   ?Assessment:  ? This is a routine wellness examination for Luis Morrison. ? ?Hearing/Vision screen ?No results found. ? ?Dietary issues and exercise activities discussed: ?  ? ? Goals Addressed   ?None ?  ?Depression Screen ? ?  09/25/2021  ?  3:55 PM 05/28/2021  ?  2:40 PM 06/26/2020  ? 10:36 AM 01/04/2020  ?  3:02 PM 01/20/2019  ?  3:07 PM 06/18/2018  ?  2:12 PM 10/09/2017  ?  3:10 PM  ?PHQ 2/9 Scores  ?PHQ - 2 Score 0 0 0 0 0 0 0  ?PHQ- 9 Score  0   0    ?  ?Fall Risk ? ?  09/25/2021  ?  3:55 PM 05/28/2021  ?  2:36 PM 06/26/2020  ? 10:34 AM 01/04/2020  ?  3:02 PM 08/06/2016  ?  9:45 AM  ?Fall Risk   ?Falls in the past year? 0 0 0 0 No  ?Number falls in past yr: 0  0 0   ?Injury with Fall? 0  0 0   ?Risk for fall due to : No Fall Risks      ? ? ?FALL RISK PREVENTION PERTAINING TO THE HOME: ? ?Any stairs in or around the home?yes ?If so, are there any without handrails? No  ?Home free of loose throw rugs in walkways, pet beds, electrical cords, etc? Yes  ?Adequate lighting in your home to reduce risk of falls? Yes  ? ?ASSISTIVE DEVICES UTILIZED TO PREVENT FALLS: ? ?Life alert? No  ?Use of a cane, walker or w/c? No  ?Grab bars in the bathroom? No  ?Shower chair or bench in shower? No  ?Elevated toilet seat or a handicapped toilet? No  ? ?TIMED UP AND GO: ? ?Was the test performed? No .  ?Length of time to ambulate 10 feet: Less than 5 sec.  ? ?Gait steady and fast without use  of assistive device ? ?Cognitive Function: ? ?  09/25/2021  ?  3:56  PM 01/04/2020  ?  3:03 PM  ?MMSE - Mini Mental State Exam  ?Orientation to time 5 5  ?Orientation to Place 5 5  ?Registration 3 3  ?Attention/ Calculation 5 0  ?Recall 3 3  ?Language- name 2 objects 2 2  ?Language- repeat 1 1  ?Language- follow 3 step command 3 3  ?Language- read & follow direction 1 1  ?Write a sentence 1 1  ?Copy design 1 1  ?Total score 30 25  ? ?  ?  ? ?Immunizations ?Immunization History  ?Administered Date(s) Administered  ? Hepatitis B, adult 01/23/2016, 02/26/2016  ? Influenza,inj,Quad PF,6+ Mos 02/25/2017, 04/20/2018, 05/04/2019, 02/19/2021  ? Pneumococcal Conjugate-13 05/04/2019  ? Pneumococcal Polysaccharide-23 02/19/2021  ? Tdap 12/06/2015, 12/20/2018  ? ? ?TDAP status: Up to date ? ?Flu Vaccine status: Up to date ? ?Pneumococcal vaccine status: Up to date ? ?Covid-19 vaccine status: Declined, Education has been provided regarding the importance of this vaccine but patient still declined. Advised may receive this vaccine at local pharmacy or Health Dept.or vaccine clinic. Aware to provide a copy of the vaccination record if obtained from local pharmacy or Health Dept. Verbalized acceptance and understanding. ? ?Qualifies for Shingles Vaccine? Yes   ?Zostavax completed No   ?Shingrix Completed?: No.    Education has been provided regarding the importance of this vaccine. Patient has been advised to call insurance company to determine out of pocket expense if they have not yet received this vaccine. Advised may also receive vaccine at local pharmacy or Health Dept. Verbalized acceptance and understanding. ? ?Screening Tests ?Health Maintenance  ?Topic Date Due  ? COVID-19 Vaccine (1) Never done  ? Zoster Vaccines- Shingrix (1 of 2) Never done  ? INFLUENZA VACCINE  01/08/2022  ? COLONOSCOPY (Pts 45-51yr Insurance coverage will need to be confirmed)  05/09/2027  ? TETANUS/TDAP  12/19/2028  ? Pneumonia Vaccine 68 Years old   Completed  ? Hepatitis C Screening  Completed  ? HPV VACCINES  Aged Out  ? ? ?Health Maintenance ? ?Health Maintenance Due  ?Topic Date Due  ? COVID-19 Vaccine (1) Never done  ? Zoster Vaccines- Shingrix (

## 2021-10-12 ENCOUNTER — Other Ambulatory Visit: Payer: Self-pay | Admitting: Internal Medicine

## 2021-10-12 DIAGNOSIS — N529 Male erectile dysfunction, unspecified: Secondary | ICD-10-CM

## 2021-10-12 MED ORDER — SILDENAFIL CITRATE 50 MG PO TABS: ORAL_TABLET | ORAL | 3 refills | Status: DC

## 2021-10-12 NOTE — Telephone Encounter (Signed)
Requested Prescriptions  ?Pending Prescriptions Disp Refills  ?? sildenafil (VIAGRA) 50 MG tablet 10 tablet 6  ?  Sig: TAKE 1 TABLET BY MOUTH AS NEEDED HALF HOUR PRIOR TO INTERCOURSE LIMIT USE TO 1 TAB/24 HOURS  ?  ? Urology: Erectile Dysfunction Agents Failed - 10/12/2021  5:33 PM  ?  ?  Failed - AST in normal range and within 360 days  ?  AST  ?Date Value Ref Range Status  ?02/19/2021 45 (H) 0 - 40 IU/L Final  ?   ?  ?  Failed - Last BP in normal range  ?  BP Readings from Last 1 Encounters:  ?05/28/21 140/82  ?   ?  ?  Passed - ALT in normal range and within 360 days  ?  ALT  ?Date Value Ref Range Status  ?02/19/2021 28 0 - 44 IU/L Final  ?   ?  ?  Passed - Valid encounter within last 12 months  ?  Recent Outpatient Visits   ?      ? 4 months ago Acute cough  ? [place] Ladell Pier, MD  ? 8 months ago Essential hypertension  ? [place] Ladell Pier, MD  ? 1 year ago Acute upper respiratory infection  ? [place] Ladell Pier, MD  ? 1 year ago Encounter for Medicare annual wellness exam  ? [place] Ladell Pier, MD  ? 2 years ago Cirrhosis of liver without ascites, unspecified hepatic cirrhosis type [facility])  ? [place] Ladell Pier, MD  ?  ?  ? ?  ?  ?  ? ? ?

## 2021-10-12 NOTE — Telephone Encounter (Signed)
Medication Refill - Medication: sildenafil (VIAGRA) 50 MG tablet ? ?Has the patient contacted their pharmacy? Yes. Pt told to contact provider ? ?Preferred Pharmacy (with phone number or street name):  ?[place] ([place]), [place] - [address] Phone:  [phone]  ?Fax:  [phone]  ?  ? ?Has the patient been seen for an appointment in the last year OR does the patient have an upcoming appointment? Yes.   ? ?Agent: Please be advised that RX refills may take up to 3 business days. We ask that you follow-up with your pharmacy. ? ?

## 2021-11-28 ENCOUNTER — Observation Stay (HOSPITAL_COMMUNITY): Payer: Medicare Other

## 2021-11-28 ENCOUNTER — Encounter (HOSPITAL_COMMUNITY): Payer: Self-pay

## 2021-11-28 ENCOUNTER — Ambulatory Visit: Payer: Self-pay | Admitting: *Deleted

## 2021-11-28 ENCOUNTER — Other Ambulatory Visit: Payer: Self-pay

## 2021-11-28 ENCOUNTER — Inpatient Hospital Stay (HOSPITAL_COMMUNITY)
Admission: EM | Admit: 2021-11-28 | Discharge: 2021-12-02 | DRG: 432 | Disposition: A | Discharge status: Home / Self Care | Payer: Medicare Other | Attending: Family Medicine | Admitting: Family Medicine

## 2021-11-28 DIAGNOSIS — R7302 Impaired glucose tolerance (oral): Secondary | ICD-10-CM | POA: Diagnosis present

## 2021-11-28 DIAGNOSIS — K7031 Alcoholic cirrhosis of liver with ascites: Secondary | ICD-10-CM | POA: Diagnosis not present

## 2021-11-28 DIAGNOSIS — K3189 Other diseases of stomach and duodenum: Secondary | ICD-10-CM

## 2021-11-28 DIAGNOSIS — Z79899 Other long term (current) drug therapy: Secondary | ICD-10-CM

## 2021-11-28 DIAGNOSIS — I8511 Secondary esophageal varices with bleeding: Secondary | ICD-10-CM

## 2021-11-28 DIAGNOSIS — Z808 Family history of malignant neoplasm of other organs or systems: Secondary | ICD-10-CM

## 2021-11-28 DIAGNOSIS — F1010 Alcohol abuse, uncomplicated: Secondary | ICD-10-CM | POA: Diagnosis present

## 2021-11-28 DIAGNOSIS — Z8619 Personal history of other infectious and parasitic diseases: Secondary | ICD-10-CM | POA: Diagnosis present

## 2021-11-28 DIAGNOSIS — K922 Gastrointestinal hemorrhage, unspecified: Secondary | ICD-10-CM | POA: Diagnosis not present

## 2021-11-28 DIAGNOSIS — R1032 Left lower quadrant pain: Secondary | ICD-10-CM | POA: Diagnosis not present

## 2021-11-28 DIAGNOSIS — K7460 Unspecified cirrhosis of liver: Secondary | ICD-10-CM | POA: Diagnosis present

## 2021-11-28 DIAGNOSIS — I10 Essential (primary) hypertension: Secondary | ICD-10-CM

## 2021-11-28 DIAGNOSIS — D696 Thrombocytopenia, unspecified: Secondary | ICD-10-CM

## 2021-11-28 DIAGNOSIS — K7682 Hepatic encephalopathy: Secondary | ICD-10-CM | POA: Diagnosis present

## 2021-11-28 DIAGNOSIS — K766 Portal hypertension: Secondary | ICD-10-CM | POA: Diagnosis present

## 2021-11-28 LAB — CBC
HCT: 41.5 % (ref 39.0–52.0)
Hemoglobin: 13.8 g/dL (ref 13.0–17.0)
MCH: 28.6 pg (ref 26.0–34.0)
MCHC: 33.3 g/dL (ref 30.0–36.0)
MCV: 86.1 fL (ref 80.0–100.0)
Platelets: 73 10*3/uL — ABNORMAL LOW (ref 150–400)
RBC: 4.82 MIL/uL (ref 4.22–5.81)
RDW: 14.8 % (ref 11.5–15.5)
WBC: 8.2 10*3/uL (ref 4.0–10.5)
nRBC: 0 % (ref 0.0–0.2)

## 2021-11-28 LAB — COMPREHENSIVE METABOLIC PANEL
ALT: 29 U/L (ref 0–44)
AST: 38 U/L (ref 15–41)
Albumin: 3.2 g/dL — ABNORMAL LOW (ref 3.5–5.0)
Alkaline Phosphatase: 108 U/L (ref 38–126)
Anion gap: 9 (ref 5–15)
BUN: 22 mg/dL (ref 8–23)
CO2: 22 mmol/L (ref 22–32)
Calcium: 9.3 mg/dL (ref 8.9–10.3)
Chloride: 109 mmol/L (ref 98–111)
Creatinine, Ser: 0.98 mg/dL (ref 0.61–1.24)
GFR, Estimated: >60 mL/min (ref >60)
Glucose, Bld: 122 mg/dL — ABNORMAL HIGH (ref 70–99)
Potassium: 4.1 mmol/L (ref 3.5–5.1)
Sodium: 140 mmol/L (ref 135–145)
Total Bilirubin: 1.1 mg/dL (ref 0.3–1.2)
Total Protein: 7.6 g/dL (ref 6.5–8.1)

## 2021-11-28 LAB — TYPE AND SCREEN
ABO/RH(D): A POS
Antibody Screen: NEGATIVE

## 2021-11-28 LAB — URINALYSIS, ROUTINE W REFLEX MICROSCOPIC
Bilirubin Urine: NEGATIVE
Glucose, UA: NEGATIVE mg/dL
Hgb urine dipstick: NEGATIVE
Ketones, ur: 5 mg/dL — AB
Leukocytes,Ua: NEGATIVE
Nitrite: NEGATIVE
Protein, ur: NEGATIVE mg/dL
Specific Gravity, Urine: 1.025 (ref 1.005–1.030)
pH: 6 (ref 5.0–8.0)

## 2021-11-28 LAB — PROTIME-INR
INR: 1.2 (ref 0.8–1.2)
Prothrombin Time: 15.3 seconds — ABNORMAL HIGH (ref 11.4–15.2)

## 2021-11-28 LAB — POC OCCULT BLOOD, ED: Fecal Occult Bld: POSITIVE — AB

## 2021-11-28 LAB — LIPASE, BLOOD: Lipase: 27 U/L (ref 11–51)

## 2021-11-28 MED ORDER — PANTOPRAZOLE SODIUM 40 MG IV SOLR
40.0000 mg | Freq: Once | INTRAVENOUS | Status: AC
  Administered 2021-11-28: 40 mg via INTRAVENOUS
  Filled 2021-11-28: qty 10

## 2021-11-28 MED ORDER — MORPHINE SULFATE (PF) 4 MG/ML IV SOLN
4.0000 mg | Freq: Once | INTRAVENOUS | Status: AC
  Administered 2021-11-28: 4 mg via INTRAVENOUS
  Filled 2021-11-28: qty 1

## 2021-11-28 MED ORDER — PANTOPRAZOLE INFUSION (NEW) - SIMPLE MED
8.0000 mg/h | INTRAVENOUS | Status: AC
  Administered 2021-11-28 – 2021-11-30 (×6): 8 mg/h via INTRAVENOUS
  Filled 2021-11-28 (×3): qty 80
  Filled 2021-11-28 (×3): qty 100
  Filled 2021-11-28 (×2): qty 80

## 2021-11-28 MED ORDER — SODIUM CHLORIDE 0.9 % IV SOLN
50.0000 ug/h | INTRAVENOUS | Status: AC
  Administered 2021-11-28 – 2021-12-01 (×6): 50 ug/h via INTRAVENOUS
  Filled 2021-11-28 (×10): qty 1

## 2021-11-28 MED ORDER — IOHEXOL 350 MG/ML SOLN
100.0000 mL | Freq: Once | INTRAVENOUS | Status: AC | PRN
  Administered 2021-11-28: 100 mL via INTRAVENOUS

## 2021-11-28 MED ORDER — SODIUM CHLORIDE 0.9 % IV BOLUS
1000.0000 mL | Freq: Once | INTRAVENOUS | Status: AC
  Administered 2021-11-28: 1000 mL via INTRAVENOUS

## 2021-11-28 MED ORDER — LORAZEPAM 2 MG/ML IJ SOLN: 1.0000 mg | INTRAMUSCULAR | Status: DC | PRN

## 2021-11-28 MED ORDER — OCTREOTIDE LOAD VIA INFUSION
50.0000 ug | Freq: Once | INTRAVENOUS | Status: AC
  Administered 2021-11-28: 50 ug via INTRAVENOUS
  Filled 2021-11-28: qty 25

## 2021-11-28 MED ORDER — PANTOPRAZOLE 80MG IVPB - SIMPLE MED
80.0000 mg | Freq: Once | INTRAVENOUS | Status: AC
  Administered 2021-11-28: 80 mg via INTRAVENOUS
  Filled 2021-11-28: qty 80

## 2021-11-28 MED ORDER — MORPHINE SULFATE (PF) 2 MG/ML IV SOLN
1.0000 mg | INTRAVENOUS | Status: DC | PRN
  Administered 2021-11-29 – 2021-11-30 (×5): 1 mg via INTRAVENOUS
  Filled 2021-11-28 (×5): qty 1

## 2021-11-28 MED ORDER — ACETAMINOPHEN 325 MG PO TABS: 650.0000 mg | ORAL_TABLET | Freq: Four times a day (QID) | ORAL | Status: DC | PRN

## 2021-11-28 MED ORDER — ACETAMINOPHEN 650 MG RE SUPP: 650.0000 mg | Freq: Four times a day (QID) | RECTAL | Status: DC | PRN

## 2021-11-28 MED ORDER — SODIUM CHLORIDE 0.9 % IV SOLN
2.0000 g | INTRAVENOUS | Status: DC
  Administered 2021-11-28 – 2021-11-30 (×3): 2 g via INTRAVENOUS
  Filled 2021-11-28 (×3): qty 20

## 2021-11-28 MED ORDER — LORAZEPAM 1 MG PO TABS: 1.0000 mg | ORAL_TABLET | ORAL | Status: DC | PRN

## 2021-11-28 MED ORDER — ONDANSETRON HCL 4 MG/2ML IJ SOLN
4.0000 mg | Freq: Four times a day (QID) | INTRAMUSCULAR | Status: DC | PRN
Start: 2021-11-28 — End: 2021-12-02
  Administered 2021-11-29: 4 mg via INTRAVENOUS
  Filled 2021-11-28: qty 2

## 2021-11-28 MED ORDER — ONDANSETRON HCL 4 MG/2ML IJ SOLN
4.0000 mg | Freq: Once | INTRAMUSCULAR | Status: AC
  Administered 2021-11-28: 4 mg via INTRAVENOUS
  Filled 2021-11-28: qty 2

## 2021-11-28 MED ORDER — THIAMINE HCL 100 MG/ML IJ SOLN
100.0000 mg | Freq: Every day | INTRAMUSCULAR | Status: DC
  Administered 2021-11-29 – 2021-11-30 (×2): 100 mg via INTRAVENOUS
  Filled 2021-11-28 (×2): qty 2

## 2021-11-28 MED ORDER — THIAMINE HCL 100 MG PO TABS
100.0000 mg | ORAL_TABLET | Freq: Every day | ORAL | Status: DC
  Administered 2021-11-28: 100 mg via ORAL
  Filled 2021-11-28: qty 1

## 2021-11-28 NOTE — ED Provider Notes (Cosign Needed)
[facility] EMERGENCY DEPARTMENT Provider Note   CSN: [id number] Arrival date & time: 11/28/21  1235     History  Chief Complaint  Patient presents with   Abdominal Pain    Luis Morrison is a 66 y.o. male.  The history is provided by the patient and medical records. No language interpreter was used.  Abdominal Pain   66 year old male significant history of alcohol abuse, alcohol-related cirrhosis, hepatitis C, presented to the ER from home with complaining of abdominal pain.  Patient report for the past 2 days he has been having pain to his upper abdomen and follows with persistent nausea and vomiting and having black tarry stool.  States he was vomiting up black emesis multiple times daily as well as having dark tarry stool.  He feels weak.  He endorsed nausea.  He does not complain of fever chest pain or shortness of breath and denies any urinary symptoms.  He admits to alcohol use and states he only drinks on the weekend last drink was 4 days ago.  Does have history of hepatitis C that was cured with medications with combinations of Harvoni and ribavirin.  Does have history of liver cirrhosis.  Denies any recent NSAID use.  Home Medications Prior to Admission medications   Medication Sig Start Date End Date Taking? Authorizing Provider  benzonatate (TESSALON) 100 MG capsule Take 1 capsule (100 mg total) by mouth 2 (two) times daily as needed for cough. Patient not taking: Reported on 09/25/2021 05/28/21   Ladell Pier, MD  ergocalciferol (VITAMIN D2) 1.25 MG (50000 UT) capsule Take 1 capsule (50,000 Units total) by mouth once a week. Vit D deficiency - E55.9 Patient not taking: Reported on 09/25/2021 09/02/19   Ladell Pier, MD  loratadine (CLARITIN) 10 MG tablet Take 1 tablet (10 mg total) by mouth daily. Patient not taking: Reported on 09/25/2021 05/28/21   Ladell Pier, MD  nadolol (CORGARD) 20 MG tablet Take 1 tablet (20 mg total) by mouth  daily. Patient not taking: Reported on 09/25/2021 05/28/21   Ladell Pier, MD  sildenafil (VIAGRA) 50 MG tablet TAKE 1 TABLET BY MOUTH AS NEEDED HALF HOUR PRIOR TO INTERCOURSE LIMIT USE TO 1 TAB/24 HOURS 10/12/21   Ladell Pier, MD      Allergies    Patient has no known allergies.    Review of Systems   Review of Systems  Gastrointestinal:  Positive for abdominal pain.  All other systems reviewed and are negative.   Physical Exam Updated Vital Signs BP (!) 150/70   Pulse 80   Temp 98 F (36.7 C) (Oral)   Resp 19   Ht '5\' 10"'$  (1.778 m)   Wt 95.3 kg   SpO2 100%   BMI 30.13 kg/m  Physical Exam Vitals and nursing note reviewed.  Constitutional:      General: He is not in acute distress.    Appearance: He is well-developed.  HENT:     Head: Atraumatic.  Eyes:     Conjunctiva/sclera: Conjunctivae normal.  Cardiovascular:     Rate and Rhythm: Normal rate and regular rhythm.  Pulmonary:     Effort: Pulmonary effort is normal.     Breath sounds: Normal breath sounds.  Abdominal:     Palpations: Abdomen is soft.     Tenderness: There is generalized abdominal tenderness. There is no guarding or rebound.  Genitourinary:    Comments: Chaperone present during exam, normal rectal tone no obvious  mass black tarry stool on glove fecal occult blood test positive Musculoskeletal:     Cervical back: Neck supple.  Skin:    Findings: No rash.  Neurological:     Mental Status: He is alert.     ED Results / Procedures / Treatments   Labs (all labs ordered are listed, but only abnormal results are displayed) Labs Reviewed  COMPREHENSIVE METABOLIC PANEL - Abnormal; Notable for the following components:      Result Value   Glucose, Bld 122 (*)    Albumin 3.2 (*)    All other components within normal limits  CBC - Abnormal; Notable for the following components:   Platelets 73 (*)    All other components within normal limits  URINALYSIS, ROUTINE W REFLEX MICROSCOPIC -  Abnormal; Notable for the following components:   Ketones, ur 5 (*)    All other components within normal limits  PROTIME-INR - Abnormal; Notable for the following components:   Prothrombin Time 15.3 (*)    All other components within normal limits  LIPASE, BLOOD  TYPE AND SCREEN  ABO/RH    EKG EKG Interpretation  Date/Time:  Wednesday November 28 2021 13:29:59 EDT Ventricular Rate:  106 PR Interval:  154 QRS Duration: 80 QT Interval:  330 QTC Calculation: 438 R Axis:   -30 Text Interpretation: Sinus tachycardia Left axis deviation Abnormal ECG When compared with ECG of 30-Aug-2014 10:35, PREVIOUS ECG IS PRESENT rate faster Confirmed by Ezequiel Essex [id number]) on 11/28/2021 5:09:44 PM  Radiology No results found.  Procedures .Critical Care  Performed by: Domenic Moras, PA-C Authorized by: Domenic Moras, PA-C   Critical care provider statement:    Critical care time (minutes):  40   Critical care was time spent personally by me on the following activities:  Development of treatment plan with patient or surrogate, discussions with consultants, evaluation of patient's response to treatment, examination of patient, ordering and review of laboratory studies, ordering and review of radiographic studies, ordering and performing treatments and interventions, pulse oximetry, re-evaluation of patient's condition and review of old charts     Medications Ordered in ED Medications  pantoprazole (PROTONIX) injection 40 mg (has no administration in time range)  sodium chloride 0.9 % bolus 1,000 mL (has no administration in time range)  ondansetron (ZOFRAN) injection 4 mg (has no administration in time range)  morphine (PF) 4 MG/ML injection 4 mg (has no administration in time range)  octreotide (SANDOSTATIN) 2 mcg/mL load via infusion 50 mcg (has no administration in time range)    And  octreotide (SANDOSTATIN) 500 mcg in sodium chloride 0.9 % 250 mL (2 mcg/mL) infusion (has no administration  in time range)  pantoprazole (PROTONIX) 80 mg /NS 100 mL IVPB (has no administration in time range)  pantoprozole (PROTONIX) 80 mg /NS 100 mL infusion (has no administration in time range)  cefTRIAXone (ROCEPHIN) 2 g in sodium chloride 0.9 % 100 mL IVPB (has no administration in time range)  ondansetron (ZOFRAN) injection 4 mg (has no administration in time range)    ED Course/ Medical Decision Making/ A&P                           Medical Decision Making  BP (!) 150/70   Pulse 80   Temp 98 F (36.7 C) (Oral)   Resp 19   Ht '5\' 10"'$  (1.778 m)   Wt 95.3 kg   SpO2 100%   BMI 30.13 kg/m  5:28 PM This is a 66 year old male with a history of alcohol abuse, alcoholic liver cirrhosis and hepatitis C who presents with complaints of having coffee-ground emesis as well as having black tarry stool ongoing for the past 2 days.  He also endorsed having some mild upper abdominal discomfort.  Patient overall well-appearing, blood pressure is 150/70.  He is afebrile, he is not tachycardic and he is not hypoxic.  Abdominal exam fairly unremarkable.  Labs was obtained, independently viewed interpreted by me.  Blood work-up fairly reassuring, EKG without concerning ischemic changes abnormal arrhythmia.  His symptoms is concerning for upper GI bleed given coffee-ground emesis and black tarry stool suggestive of digested blood.  Will initiate Protonix and will also obtain abdominal pelvis CT scan.  Patient has low platelets of 73, and improvement from prior value  5:57 PM Appreciate consultation from GI specialist, Dr. Alinda Sierras who request medicine to admit patient to progressive unit but patient may need to go to intensive care unit if he decompensates or if he has bright red blood emesis.  He also request for patient to be placed on octreotide bolus and drip, Protonix bolus and drip, Rocephin 2 g today and tomorrow, antinausea medication as needed and for patient to be strict n.p.o. anticipate endoscopy  tomorrow.   6:16 PM Appreciate consultation from Hydro. Rogers Blocker who agrees to see and will admit pt for further care.  Pt made aware of plan and agrees with plan.    This patient presents to the ED for concern of abd pain, this involves an extensive number of treatment options, and is a complaint that carries with it a high risk of complications and morbidity.  The differential diagnosis includes gastritis, UGIB, esophageal varices, GERD, PUD, pancreatitis, appendicitis, cholecystitis, colitis, diverticulitis, AVM, malignancy  Co morbidities that complicate the patient evaluation alcoholic cirrhosis  Hepatitis C Additional history obtained:  Additional history obtained from patient External records from outside source obtained and reviewed including notes from GI when he was diagnosed with liver cirrhosis several years ago  Lab Tests:  I Ordered, and personally interpreted labs.  The pertinent results include:  as above  Imaging Studies ordered:  I ordered imaging studies including I independently visualized and interpreted imaging of abd/pelvis CT which showed hypererema of the jejunum, may represent prior hemorrhage I agree with the radiologist interpretation  Cardiac Monitoring:  The patient was maintained on a cardiac monitor.  I personally viewed and interpreted the cardiac monitored which showed an underlying rhythm of: sinus tachycardia  Medicines ordered and prescription drug management:  I ordered medication including Octreotide, PRotonix, Morphine, Zofran, ROcephin  for GIB, possibly esophageal varices Reevaluation of the patient after these medicines showed that the patient stayed the same I have reviewed the patients home medicines and have made adjustments as needed  Test Considered: as above  Critical Interventions: as above  Consultations Obtained:  I requested consultation with the [place] [facility] GI specialist,  and discussed lab and imaging findings  as well as pertinent plan - they recommend: admission  Problem List / ED Course: UGIB  Abd pain  thrombocytopenia  Reevaluation:  After the interventions noted above, I reevaluated the patient and found that they have :improved  Social Determinants of Health:   Dispostion:  After consideration of the diagnostic results and the patients response to treatment, I feel that the patent would benefit from admission.         Final Clinical Impression(s) / ED Diagnoses Final diagnoses:  UGIB (  upper gastrointestinal bleed)  Thrombocytopenia [facility])    Rx / DC Orders ED Discharge Orders     None         Domenic Moras, PA-C 11/29/21 0003

## 2021-11-28 NOTE — Assessment & Plan Note (Signed)
Noted  

## 2021-11-28 NOTE — Assessment & Plan Note (Signed)
Secondary to cirrhosis Stable SCDs

## 2021-11-28 NOTE — Assessment & Plan Note (Addendum)
66 year old male with 2 day history of coffee ground emesis, black tarry stool and fecal occult positive found to have UGIB in setting of known esophageal varices with cirrhosis -obs to progressive -GI consulted: Dr. Rush Landmark with likely EGD tomorrow unless decompensates overnight  -protonix gtt and octreotide -rocephin 2 g x two doses -NPO -serial H&H q6 hours x 3 -SCDs  -CTA pelvis/abdomen pending with LLQ pain

## 2021-11-28 NOTE — ED Provider Triage Note (Signed)
Emergency Medicine Provider Triage Evaluation Note  Luis Morrison , a 66 y.o. male  was evaluated in triage.  Pt complains of abd pain NV. Hx of etoh use and cirrhosis still drinking. Last drink 4 days since last drink.  Started feeling unwell yesterday. States he vomited and noticed his vomit was black.   States sx persisted today. States stool has been black.  Review of Systems  Positive: NV, abd pain Negative: Fever   Physical Exam  BP (!) 149/73   Pulse (!) 114   Temp 98.4 F (36.9 C) (Oral)   Resp 20   Ht '5\' 10"'$  (1.778 m)   Wt 95.3 kg   SpO2 99%   BMI 30.13 kg/m  Gen:   Awake, no distress   Resp:  Normal effort  MSK:   Moves extremities without difficulty  Other:  Abd soft, distended   Medical Decision Making  Medically screening exam initiated at 1:00 PM.  Appropriate orders placed.  Luis Morrison was informed that the remainder of the evaluation will be completed by another provider, this initial triage assessment does not replace that evaluation, and the importance of remaining in the ED until their evaluation is complete.  W black emesis will obtain inr/type and screen. BP WNLs/hypertensive. Appears non-toxic.   Pati Gallo [place], [place] 11/28/21 1302

## 2021-11-28 NOTE — Assessment & Plan Note (Addendum)
Appears compensated  History of grade 2 esophageal varices by EGD in 04/2017 Last seen in GI clinic in 03/2017 MELD score today 9  Thrombocytopenia  No AFP tumor screen since 2018, will update here  GI to see and will need better f/u outpatient for routine screening/care

## 2021-11-28 NOTE — ED Notes (Signed)
POC Occult blood test Positive

## 2021-11-28 NOTE — H&P (Signed)
History and Physical    Patient: Luis Morrison BUL:[id number] DOB: [birth year] DOA: 11/28/2021 DOS: the patient was seen and examined on 11/28/2021 PCP: Ladell Pier, MD  Patient coming from: Home - lives with his uncle    Chief Complaint: coffee ground emesis/LLQ pain and dark tarry stools   HPI: Luis Morrison is a 66 y.o. male with medical history significant of history of hep C s/p clearance with Harvoni, cirrhosis with esophageal varices, alcohol abuse who presents to ED with complaints of coffee ground emesis x 2 days with dark, tarry stool and some abdominal pain in LLQ x 2 days. He states he has not eaten today. Denies any NSAID use/recent steroid use. His abdominal pain comes and goes and is relieved a little if he lies down. In LLQ, no radiation. Sharp in nature. Stool is mainly dark and tarry with some red color to it. No hx of GI bleed in the past.   Last colonoscopy and EGD 04/2017  Colonoscopy: grade 1 internal hemorrhoids. Recall 10 years EGD: Grade II varices were found in the middle third of the esophagus and in the lower third of the esophagus. Portal hypertensive gastropathy was found in the entire examined stomach. The cardia and gastric fundus were normal on retroflexion. Specifically, no gastric varices were seen. Started on nadolol, but no longer taking this. Poor follow up with GI, last seen in office in 03/2017.    He has been feeling good. Denies any fever/chills, vision changes/headaches, chest pain or palpitations, shortness of breath or cough, dysuria or leg swelling.   He drinks only on the weekends and will have a 4-5 beers and some mixed drinks. Denies any  history of alcohol withdrawal. Does not smoke.     ER Course:  vitals: afebrile, bp: 149/73, HR; 114, RR: 20, oxygen: 99%RA Pertinent labs: platelets: 73, fecal occult positive  In ED: GI consulted. Started on rocephin, octreotide and protonix. Given 1L bolus.     Review of Systems: As mentioned in  the history of present illness. All other systems reviewed and are negative. Past Medical History:  Diagnosis Date   Cirrhosis ([place]) 10/09/2014   History reviewed. No pertinent surgical history. Social History:  reports that he has never smoked. He has never used smokeless tobacco. He reports current alcohol use of about 2.0 standard drinks of alcohol per week. He reports that he does not use drugs.  No Known Allergies  Family History  Problem Relation Age of Onset   Cancer Mother    Brain cancer Mother    Colon cancer Neg Hx    Colon polyps Neg Hx    Esophageal cancer Neg Hx    Rectal cancer Neg Hx    Stomach cancer Neg Hx     Prior to Admission medications   Medication Sig Start Date End Date Taking? Authorizing Provider  benzonatate (TESSALON) 100 MG capsule Take 1 capsule (100 mg total) by mouth 2 (two) times daily as needed for cough. Patient not taking: Reported on 09/25/2021 05/28/21   Ladell Pier, MD  ergocalciferol (VITAMIN D2) 1.25 MG (50000 UT) capsule Take 1 capsule (50,000 Units total) by mouth once a week. Vit D deficiency - E55.9 Patient not taking: Reported on 09/25/2021 09/02/19   Ladell Pier, MD  loratadine (CLARITIN) 10 MG tablet Take 1 tablet (10 mg total) by mouth daily. Patient not taking: Reported on 09/25/2021 05/28/21   Ladell Pier, MD  nadolol (CORGARD) 20 MG tablet Take  1 tablet (20 mg total) by mouth daily. Patient not taking: Reported on 09/25/2021 05/28/21   Ladell Pier, MD  sildenafil (VIAGRA) 50 MG tablet TAKE 1 TABLET BY MOUTH AS NEEDED HALF HOUR PRIOR TO INTERCOURSE LIMIT USE TO 1 TAB/24 HOURS 10/12/21   Ladell Pier, MD    Physical Exam: Vitals:   11/28/21 1249 11/28/21 1251 11/28/21 1610 11/28/21 1730  BP: (!) 149/73  (!) 150/70 127/80  Pulse: (!) 114  80 86  Resp: '20  19 14  '$ Temp: 98.4 F (36.9 C)  98 F (36.7 C)   TempSrc: Oral  Oral   SpO2: 99%  100% 99%  Weight:  95.3 kg    Height:  '5\' 10"'$  (1.778 m)      General:  Appears calm and comfortable and is in NAD Eyes:  PERRL, EOMI, normal lids, iris ENT:  grossly normal hearing, lips & tongue, mmm; appropriate dentition Neck:  no LAD, masses or thyromegaly; no carotid bruits Cardiovascular:  RRR, no m/r/g. No LE edema.  Respiratory:   CTA bilaterally with no wheezes/rales/rhonchi.  Normal respiratory effort. Abdomen:  soft, mild TTP in LLQ, but no rebound or guarding and distractible, ND, NABS. No fluid wave Back:   normal alignment, no CVAT Skin:  no rash or induration seen on limited exam Musculoskeletal:  grossly normal tone BUE/BLE, good ROM, no bony abnormality Lower extremity:  No LE edema.  Limited foot exam with no ulcerations.  2+ distal pulses. Psychiatric:  grossly normal mood and affect, speech fluent and appropriate, AOx3 Neurologic:  CN 2-12 grossly intact, moves all extremities in coordinated fashion, sensation intact   Radiological Exams on Admission: Independently reviewed - see discussion in A/P where applicable  No results found.  EKG: Independently reviewed.  Sinus tachycardia with rate 106; nonspecific ST changes with no evidence of acute ischemia   Labs on Admission: I have personally reviewed the available labs and imaging studies at the time of the admission.  Pertinent labs:   Platelets 73   Assessment and Plan: Principal Problem:   UGIB (upper gastrointestinal bleed) Active Problems:   Cirrhosis of liver without ascites (HCC)   Alcohol abuse   Thrombocytopenia (HCC)   Hepatitis C virus infection cured after antiviral drug therapy    Assessment and Plan: * UGIB (upper gastrointestinal bleed) 66 year old male with 2 day history of coffee ground emesis, black tarry stool and fecal occult positive found to have UGIB in setting of known esophageal varices with cirrhosis -obs to progressive -GI consulted: Dr. Rush Landmark with likely EGD tomorrow unless decompensates overnight  -protonix gtt and  octreotide -rocephin 2 g x two doses -NPO -serial H&H q6 hours x 3 -SCDs  -CTA pelvis/abdomen pending with LLQ pain    Cirrhosis of liver without ascites ([place]) Appears compensated  History of grade 2 esophageal varices by EGD in 04/2017 Last seen in GI clinic in 03/2017 MELD score today 9  Thrombocytopenia  No AFP tumor screen since 2018, will update here  GI to see and will need better f/u outpatient for routine screening/care   Alcohol abuse Has cut down to drinking only on weekend Denies any history of withdrawal  CIWA protocol Start thiamine IV  MV, folic acid when no longer NPO  Understands he needs to cut out alcohol completely    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 Rush Landmark   DVT Prophylaxis: SCDs  Family Communication: none   Severity of Illness: The appropriate patient status for this patient is OBSERVATION. Observation status is judged to be reasonable and necessary in order to provide the required intensity of service to ensure the patient's safety. The patient's presenting symptoms, physical exam findings, and initial radiographic and laboratory data in the context of their medical condition is felt to place them at decreased risk for further clinical deterioration. Furthermore, it is anticipated that the patient will be medically stable for discharge from the hospital within 2 midnights of admission.   Author: Orma Flaming, MD 11/28/2021 6:56 PM  For on call review [email or web address].

## 2021-11-28 NOTE — Assessment & Plan Note (Addendum)
Has cut down to drinking only on weekend Denies any history of withdrawal  CIWA protocol Start thiamine IV  MV, folic acid when no longer NPO  Understands he needs to cut out alcohol completely

## 2021-11-28 NOTE — Telephone Encounter (Signed)
  Chief Complaint: vomiting  dark in color and BM black Symptoms: vomiting x 2 today dark slimy stringing emesis, like coffee grounds. Bm today black in color severe abdominal pain  Frequency: c/o yesterday  Pertinent Negatives: Patient denies dizziness or weakness  Disposition: '[x]'$ ED /'[]'$ Urgent Care (no appt availability in office) / '[]'$ Appointment(In office/virtual)/ '[]'$  [facility]/ '[]'$ Home Care/ '[]'$ Refused Recommended Disposition /'[]'$ [facility]/ '[]'$  Follow-up with PCP Additional Notes:   Recommended to go to ED now    Reason for Disposition  [1] Vomiting AND [2] contains red blood or black ("coffee ground") material  (Exception: few red streaks in vomit that only happened once)  Answer Assessment - Initial Assessment Questions 1. VOMITING SEVERITY: "How many times have you vomited in the past 24 hours?"     - MILD:  1 - 2 times/day    - MODERATE: 3 - 5 times/day, decreased oral intake without significant weight loss or symptoms of dehydration    - SEVERE: 6 or more times/day, vomits everything or nearly everything, with significant weight loss, symptoms of dehydration      2 times today and emesis dark in color like dark slimy stringing 2. ONSET: "When did the vomiting begin?"      na 3. FLUIDS: "What fluids or food have you vomited up today?" "Have you been able to keep any fluids down?"     na 4. ABDOMINAL PAIN: "Are your having any abdominal pain?" If yes : "How bad is it and what does it feel like?" (e.g., crampy, dull, intermittent, constant)      Yes  5. DIARRHEA: "Is there any diarrhea?" If Yes, ask: "How many times today?"      Yes black in color  6. CONTACTS: "Is there anyone else in the family with the same symptoms?"      na 7. CAUSE: "What do you think is causing your vomiting?"     Not sure  8. HYDRATION STATUS: "Any signs of dehydration?" (e.g., dry mouth [not only dry lips], too weak to stand) "When did you last urinate?"     Denies  9. OTHER  SYMPTOMS: "Do you have any other symptoms?" (e.g., fever, headache, vertigo, vomiting blood or coffee grounds, recent head injury)     Coffee grounds 10. PREGNANCY: "Is there any chance you are pregnant?" "When was your last menstrual period?"       na  Protocols used: Vomiting-A-AH

## 2021-11-28 NOTE — ED Triage Notes (Signed)
Pt arrived POV from home c/o left sided abdominal pain that started yesterday and N/V. Pt states he is throwing up black stuff.

## 2021-11-29 ENCOUNTER — Encounter (HOSPITAL_COMMUNITY)
Admission: EM | Disposition: A | Discharge status: Home / Self Care | Payer: Self-pay | Source: Home / Self Care | Attending: Family Medicine

## 2021-11-29 ENCOUNTER — Observation Stay (HOSPITAL_BASED_OUTPATIENT_CLINIC_OR_DEPARTMENT_OTHER): Payer: Medicare Other | Admitting: Anesthesiology

## 2021-11-29 ENCOUNTER — Observation Stay (HOSPITAL_COMMUNITY): Payer: Medicare Other | Admitting: Anesthesiology

## 2021-11-29 ENCOUNTER — Encounter (HOSPITAL_COMMUNITY): Payer: Self-pay | Admitting: Family Medicine

## 2021-11-29 DIAGNOSIS — Z8719 Personal history of other diseases of the digestive system: Secondary | ICD-10-CM | POA: Diagnosis not present

## 2021-11-29 DIAGNOSIS — Z8619 Personal history of other infectious and parasitic diseases: Secondary | ICD-10-CM | POA: Diagnosis not present

## 2021-11-29 DIAGNOSIS — K3189 Other diseases of stomach and duodenum: Secondary | ICD-10-CM | POA: Diagnosis present

## 2021-11-29 DIAGNOSIS — K7031 Alcoholic cirrhosis of liver with ascites: Secondary | ICD-10-CM | POA: Diagnosis present

## 2021-11-29 DIAGNOSIS — K921 Melena: Secondary | ICD-10-CM

## 2021-11-29 DIAGNOSIS — R7302 Impaired glucose tolerance (oral): Secondary | ICD-10-CM | POA: Diagnosis present

## 2021-11-29 DIAGNOSIS — Z79899 Other long term (current) drug therapy: Secondary | ICD-10-CM | POA: Diagnosis not present

## 2021-11-29 DIAGNOSIS — D696 Thrombocytopenia, unspecified: Secondary | ICD-10-CM | POA: Diagnosis present

## 2021-11-29 DIAGNOSIS — K766 Portal hypertension: Secondary | ICD-10-CM | POA: Diagnosis present

## 2021-11-29 DIAGNOSIS — K759 Inflammatory liver disease, unspecified: Secondary | ICD-10-CM

## 2021-11-29 DIAGNOSIS — F1010 Alcohol abuse, uncomplicated: Secondary | ICD-10-CM | POA: Diagnosis present

## 2021-11-29 DIAGNOSIS — R1032 Left lower quadrant pain: Secondary | ICD-10-CM | POA: Diagnosis present

## 2021-11-29 DIAGNOSIS — I8511 Secondary esophageal varices with bleeding: Secondary | ICD-10-CM | POA: Diagnosis not present

## 2021-11-29 DIAGNOSIS — K922 Gastrointestinal hemorrhage, unspecified: Secondary | ICD-10-CM | POA: Diagnosis not present

## 2021-11-29 DIAGNOSIS — Z808 Family history of malignant neoplasm of other organs or systems: Secondary | ICD-10-CM | POA: Diagnosis not present

## 2021-11-29 DIAGNOSIS — K7682 Hepatic encephalopathy: Secondary | ICD-10-CM | POA: Diagnosis present

## 2021-11-29 DIAGNOSIS — K7030 Alcoholic cirrhosis of liver without ascites: Secondary | ICD-10-CM | POA: Diagnosis not present

## 2021-11-29 HISTORY — PX: ESOPHAGEAL BANDING: SHX5518

## 2021-11-29 HISTORY — PX: ESOPHAGOGASTRODUODENOSCOPY (EGD) WITH PROPOFOL: SHX5813

## 2021-11-29 LAB — CBC
HCT: 36.6 % — ABNORMAL LOW (ref 39.0–52.0)
Hemoglobin: 11.7 g/dL — ABNORMAL LOW (ref 13.0–17.0)
MCH: 28.2 pg (ref 26.0–34.0)
MCHC: 32 g/dL (ref 30.0–36.0)
MCV: 88.2 fL (ref 80.0–100.0)
Platelets: 55 10*3/uL — ABNORMAL LOW (ref 150–400)
RBC: 4.15 MIL/uL — ABNORMAL LOW (ref 4.22–5.81)
RDW: 15.2 % (ref 11.5–15.5)
WBC: 6.4 10*3/uL (ref 4.0–10.5)
nRBC: 0 % (ref 0.0–0.2)

## 2021-11-29 LAB — COMPREHENSIVE METABOLIC PANEL
ALT: 25 U/L (ref 0–44)
AST: 32 U/L (ref 15–41)
Albumin: 2.8 g/dL — ABNORMAL LOW (ref 3.5–5.0)
Alkaline Phosphatase: 84 U/L (ref 38–126)
Anion gap: 7 (ref 5–15)
BUN: 19 mg/dL (ref 8–23)
CO2: 23 mmol/L (ref 22–32)
Calcium: 8.2 mg/dL — ABNORMAL LOW (ref 8.9–10.3)
Chloride: 109 mmol/L (ref 98–111)
Creatinine, Ser: 1.06 mg/dL (ref 0.61–1.24)
GFR, Estimated: >60 mL/min (ref >60)
Glucose, Bld: 110 mg/dL — ABNORMAL HIGH (ref 70–99)
Potassium: 3.9 mmol/L (ref 3.5–5.1)
Sodium: 139 mmol/L (ref 135–145)
Total Bilirubin: 1 mg/dL (ref 0.3–1.2)
Total Protein: 6.7 g/dL (ref 6.5–8.1)

## 2021-11-29 LAB — PROTIME-INR
INR: 1.2 (ref 0.8–1.2)
Prothrombin Time: 15.4 seconds — ABNORMAL HIGH (ref 11.4–15.2)

## 2021-11-29 LAB — HEMOGLOBIN AND HEMATOCRIT, BLOOD
HCT: 40.1 % (ref 39.0–52.0)
Hemoglobin: 12.8 g/dL — ABNORMAL LOW (ref 13.0–17.0)

## 2021-11-29 LAB — HIV ANTIBODY (ROUTINE TESTING W REFLEX): HIV Screen 4th Generation wRfx: NONREACTIVE

## 2021-11-29 LAB — ABO/RH: ABO/RH(D): A POS

## 2021-11-29 SURGERY — ESOPHAGOGASTRODUODENOSCOPY (EGD) WITH PROPOFOL
Anesthesia: Monitor Anesthesia Care

## 2021-11-29 MED ORDER — SODIUM CHLORIDE 0.9 % IV SOLN: INTRAVENOUS | Status: DC

## 2021-11-29 MED ORDER — PROPOFOL 500 MG/50ML IV EMUL
INTRAVENOUS | Status: DC | PRN
  Administered 2021-11-29: 125 ug/kg/min via INTRAVENOUS

## 2021-11-29 MED ORDER — PROPOFOL 10 MG/ML IV BOLUS
INTRAVENOUS | Status: DC | PRN
  Administered 2021-11-29 (×2): 20 mg via INTRAVENOUS

## 2021-11-29 MED ORDER — FENTANYL CITRATE (PF) 100 MCG/2ML IJ SOLN: 25.0000 ug | INTRAMUSCULAR | Status: DC | PRN

## 2021-11-29 MED ORDER — LACTATED RINGERS IV SOLN: INTRAVENOUS | Status: DC

## 2021-11-29 MED ORDER — OXYCODONE HCL 5 MG PO TABS: 5.0000 mg | ORAL_TABLET | Freq: Once | ORAL | Status: DC | PRN

## 2021-11-29 MED ORDER — PHENYLEPHRINE HCL (PRESSORS) 10 MG/ML IV SOLN
INTRAVENOUS | Status: DC | PRN
  Administered 2021-11-29: 120 ug via INTRAVENOUS
  Administered 2021-11-29: 80 ug via INTRAVENOUS

## 2021-11-29 MED ORDER — LIDOCAINE 2% (20 MG/ML) 5 ML SYRINGE
INTRAMUSCULAR | Status: DC | PRN
  Administered 2021-11-29: 80 mg via INTRAVENOUS

## 2021-11-29 MED ORDER — OXYCODONE HCL 5 MG/5ML PO SOLN: 5.0000 mg | Freq: Once | ORAL | Status: DC | PRN

## 2021-11-29 MED ORDER — DEXMEDETOMIDINE (PRECEDEX) IN NS 20 MCG/5ML (4 MCG/ML) IV SYRINGE
PREFILLED_SYRINGE | INTRAVENOUS | Status: DC | PRN
  Administered 2021-11-29: 4 ug via INTRAVENOUS
  Administered 2021-11-29: 8 ug via INTRAVENOUS

## 2021-11-29 MED ORDER — ONDANSETRON HCL 4 MG/2ML IJ SOLN: 4.0000 mg | Freq: Four times a day (QID) | INTRAMUSCULAR | Status: DC | PRN

## 2021-11-29 SURGICAL SUPPLY — 15 items

## 2021-11-29 NOTE — Interval H&P Note (Signed)
History and Physical Interval Note:  11/29/2021 10:55 AM  Luis Morrison  has presented today for surgery, with the diagnosis of Melena with history of cirrhosis and esophageal varices.  The various methods of treatment have been discussed with the patient and family. After consideration of risks, benefits and other options for treatment, the patient has consented to  Procedure(s): ESOPHAGOGASTRODUODENOSCOPY (EGD) WITH PROPOFOL (N/A) as a surgical intervention.  The patient's history has been reviewed, patient examined, no change in status, stable for surgery.  I have reviewed the patient's chart and labs.  Questions were answered to the patient's satisfaction.     Sharyn Creamer

## 2021-11-29 NOTE — ED Notes (Signed)
Luis Morrison awake, alert , oriented and talking to this nurse. Acknowledge that he's not had anything to eat or drink. Denies pain or discomfort. Notified of bed assignment.

## 2021-11-29 NOTE — Transfer of Care (Signed)
Immediate Anesthesia Transfer of Care Note  Patient: Luis Morrison  Procedure(s) Performed: ESOPHAGOGASTRODUODENOSCOPY (EGD) WITH PROPOFOL ESOPHAGEAL BANDING  Patient Location: PACU  Anesthesia Type:MAC  Level of Consciousness: drowsy and patient cooperative  Airway & Oxygen Therapy: Patient Spontanous Breathing and Patient connected to nasal cannula oxygen  Post-op Assessment: Report given to RN and Post -op Vital signs reviewed and stable  Post vital signs: Reviewed and stable  Last Vitals:  Vitals Value Taken Time  BP 112/71 11/29/21 1237  Temp    Pulse 87 11/29/21 1240  Resp 29 11/29/21 1240  SpO2 99 % 11/29/21 1240  Vitals shown include unvalidated device data.  Last Pain:  Vitals:   11/29/21 1021  TempSrc: Temporal  PainSc: 0-No pain      Patients Stated Pain Goal: 0 (29/24/46 2863)  Complications: No notable events documented.
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
## 2021-11-29 NOTE — Telephone Encounter (Signed)
Pt was seen in ED for symptoms.

## 2021-11-29 NOTE — Op Note (Addendum)
[facility] Patient Name: Luis Morrison Procedure Date : 11/29/2021 MRN: [id number] Attending MD: Georgian Co ,  Date of Birth: Sep 04, 1955 CSN: [id number] Age: 66 Admit Type: Inpatient Procedure:                Upper GI endoscopy Indications:              Coffee-ground emesis Providers:                Adline Mango" Tana Felts, RN, Luan Moore, Technician Referring MD:             Hospitalist team Medicines:                Monitored Anesthesia Care Complications:            No immediate complications. Estimated Blood Loss:     Estimated blood loss was minimal. Procedure:                Pre-Anesthesia Assessment:                           - Prior to the procedure, a History and Physical                            was performed, and patient medications and                            allergies were reviewed. The patient's tolerance of                            previous anesthesia was also reviewed. The risks                            and benefits of the procedure and the sedation                            options and risks were discussed with the patient.                            All questions were answered, and informed consent                            was obtained. Prior Anticoagulants: The patient has                            taken no previous anticoagulant or antiplatelet                            agents. ASA Grade Assessment: III - A patient with                            severe systemic disease. After reviewing the risks  and benefits, the patient was deemed in                            satisfactory condition to undergo the procedure.                           After obtaining informed consent, the endoscope was                            passed under direct vision. Throughout the                            procedure, the patient's blood pressure, pulse, and                             oxygen saturations were monitored continuously. The                            [id number]-H190 ([id number]) Olympus endoscope was introduced                            through the mouth, and advanced to the second part                            of duodenum. The upper GI endoscopy was                            accomplished without difficulty. The patient                            tolerated the procedure well. Scope In: Scope Out: Findings:      Large (> 5 mm) varices with stigmata of recent bleeding (exudate) were       found in the mid esophagus and in the distal esophagus. Red wale signs       were present. Ten bands were successfully placed with complete       eradication, resulting in deflation of varices.      Severe portal hypertensive gastropathy was found in the gastric body.      The examined duodenum was normal. Impression:               - Large (> 5 mm) esophageal varices with stigmata                            of recent bleeding. Completely eradicated. Banded.                           - Portal hypertensive gastropathy.                           - Normal examined duodenum.                           - No specimens collected. Recommendation:           - Return patient to hospital ward for ongoing care.                           -  It is suspected that the patient's coffee ground                            emesis is due to a variceal bleed.                           - Use a proton pump inhibitor BID for 4 weeks.                           - Continue IV octreotide for 72 hours. Please                            continue antibiotics for 7 days for SBP prophylaxis                            in the setting of GI bleed.                           - Check serum H pylori antibody.                           - Clear liquid diet for now. If doing well by                            tomorrow, then can advance to soft diet for 3 days.                           - Repeat upper endoscopy in 4 weeks for  endoscopic                            band ligation.                           - The findings and recommendations were discussed                            with the patient and primary team. Procedure Code(s):        --- Professional ---                           [phone], Esophagogastroduodenoscopy, flexible,                            transoral; with band ligation of esophageal/gastric                            varices Diagnosis Code(s):        --- Professional ---                           I85.01, Esophageal varices with bleeding                           K76.6, Portal hypertension  K31.89, Other diseases of stomach and duodenum                           K92.0, Hematemesis CPT copyright 2019 American Medical [employer]. All rights reserved. The codes documented in this report are preliminary and upon coder review may  be revised to meet current compliance requirements. Luis Morrison "Luis Morrison,  11/29/2021 12:40:27 PM Number of Addenda: 0

## 2021-11-29 NOTE — Consult Note (Signed)
Consultation  Referring Provider:    Primary Care Physician:  Ladell Pier, MD Primary Gastroenterologist:  Dr. Loletha Carrow       Reason for Consultation:     Coffee-ground emesis, melenic stools, left lower quadrant abdominal pain in setting of cirrhosis with known esophageal varices   Impression/Plan:   Compensated cirrhosis WBC 6.4 HGB 11.7 Platelets 55 AST 32 ALT 25  Alkphos 84 TBili 1.0 MELD-Na: 9  - Serial INR, CBC, CMET daily. - monitor daily MELD  HCC screening Get AB [place] Agree with getting AFP- pending  Esophageal varices and portal hypertensive gastropathy Last EGD 2018 grade 2 esophageal varices Put on nadolol however patient was not compliant with taking her follow-up Now presenting with melanic stools, hemodynamically stable, hemoglobin stable -IV Protonix  -IV Octreotide  -IV Ceftriaxone 2000 mg x1 now (if varices noted then will continue treatment for at least 5 days as has been shown to decrease SBP and other infectious complications in Cirrhotics) -Will plan for tentative EGD on today -Please maintain NPO Status at this time I discussed risks of EGD with patient today, including risk of sedation, bleeding or perforation. Patient provides understanding and gave verbal consent to proceed. -Daily LFTs, INR -Trend CBC -Continue volume resuscitation with Hgb goal >7 (higher Hgb rates have been associated with worsened mortality in the setting of portal hypertensive variceal bleeds); unless active bleeding is occurring  Thrombocytopenia Secondary to cirrhosis, platelets of 55  Ascites no evidence on exam We will proceed with abdominal ultrasound for [place] screening.   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  - discussed deleterious  effects of ETOH  Thank you for your kind consultation, we will continue to follow.         HPI:   Luis Morrison is a 66 y.o. male with past medical history significant for history of cirrhosis secondary to alcohol, with hepatitis C s/p treatment with Harvoni, history of esophageal varices presents with coffee-ground emesis and melanic stools and abdominal pain for 2 days.  03/20/2017 office visit with Dr. Loletha Carrow 04/2017 colonoscopy and endoscopy with Dr. Loletha Carrow colonoscopy showed internal hemorrhoids otherwise unremarkable recall 10 years Endoscopy showed grade 2 esophageal varices, portal hypertensive gastropathy, normal duodenum started on nadolol 20 mg once daily, states got off of it January of this year, was post to return to the office but never followed up.  Having 2 days of melanic stools states no blood thinner, no NSAIDs, continues to drink 4-5 beers and 4-5 mixed drinks 2 days weekly last time he drank was Saturday.  Denies Pepto or iron use.  Not had a bowel movement since being in the ER. Denies any yellowing of eyes or skin, denies any lower leg edema, abdominal swelling.  Denies pruritus.  Denies any confusion. Denies NSAID use, steroid use. Patient continues to drink beers and mixed drinks on the weekends denies any history of alcohol withdrawal denies illicit drug use or smoking history.  Patient was fecal occult positive in the ER, platelets 73, hemoglobin 12.8. This morning hemoglobin 11.7, albumin 2.8, BUN 19, creatinine 1.06, AST 32, ALT 25 alk phos 84, total bilirubin 1  Abnormal ED labs: Abnormal Labs Reviewed  COMPREHENSIVE METABOLIC PANEL - Abnormal; Notable for the following components:      Result  Value   Glucose, Bld 122 (*)    Albumin 3.2 (*)    All other components within normal limits  CBC - Abnormal; Notable for the following components:   Platelets 73 (*)    All other components within normal limits  URINALYSIS, ROUTINE W REFLEX MICROSCOPIC - Abnormal;  Notable for the following components:   Ketones, ur 5 (*)    All other components within normal limits  PROTIME-INR - Abnormal; Notable for the following components:   Prothrombin Time 15.3 (*)    All other components within normal limits  HEMOGLOBIN AND HEMATOCRIT, BLOOD - Abnormal; Notable for the following components:   Hemoglobin 12.8 (*)    All other components within normal limits  COMPREHENSIVE METABOLIC PANEL - Abnormal; Notable for the following components:   Glucose, Bld 110 (*)    Calcium 8.2 (*)    Albumin 2.8 (*)    All other components within normal limits  CBC - Abnormal; Notable for the following components:   RBC 4.15 (*)    Hemoglobin 11.7 (*)    HCT 36.6 (*)    Platelets 55 (*)    All other components within normal limits  POC OCCULT BLOOD, ED - Abnormal; Notable for the following components:   Fecal Occult Bld POSITIVE (*)    All other components within normal limits     Past Medical History:  Diagnosis Date   Cirrhosis ([place]) 10/09/2014    Surgical History:  He  has no past surgical history on file. Family History:  His family history includes Brain cancer in his mother; Cancer in his mother. Social History:   reports that he has never smoked. He has never used smokeless tobacco. He reports current alcohol use of about 2.0 standard drinks of alcohol per week. He reports that he does not use drugs.  Prior to Admission medications   Medication Sig Start Date End Date Taking? Authorizing Provider  CLEAR EYES MAX REDNESS RELIEF 0.03-0.5 % SOLN Place 1 drop into both eyes 3 (three) times daily as needed (for redness).   Yes [provider]  benzonatate (TESSALON) 100 MG capsule Take 1 capsule (100 mg total) by mouth 2 (two) times daily as needed for cough. Patient not taking: Reported on 11/28/2021 05/28/21   Ladell Pier, MD  ergocalciferol (VITAMIN D2) 1.25 MG (50000 UT) capsule Take 1 capsule (50,000 Units total) by mouth once a week. Vit D  deficiency - E55.9 Patient not taking: Reported on 11/28/2021 09/02/19   Ladell Pier, MD  loratadine (CLARITIN) 10 MG tablet Take 1 tablet (10 mg total) by mouth daily. Patient not taking: Reported on 11/28/2021 05/28/21   Ladell Pier, MD  nadolol (CORGARD) 20 MG tablet Take 1 tablet (20 mg total) by mouth daily. Patient not taking: Reported on 11/28/2021 05/28/21   Ladell Pier, MD  sildenafil (VIAGRA) 50 MG tablet TAKE 1 TABLET BY MOUTH AS NEEDED HALF HOUR PRIOR TO INTERCOURSE LIMIT USE TO 1 TAB/24 HOURS Patient not taking: Reported on 11/28/2021 10/12/21   Ladell Pier, MD    Current Facility-Administered Medications  Medication Dose Route Frequency Provider Last Rate Last Admin   acetaminophen (TYLENOL) tablet 650 mg  650 mg Oral Q6H PRN Orma Flaming, MD       Or   acetaminophen (TYLENOL) suppository 650 mg  650 mg Rectal Q6H PRN Orma Flaming, MD       cefTRIAXone (ROCEPHIN) 2 g in sodium chloride 0.9 % 100 mL IVPB  2 g Intravenous Q24H Domenic Moras, PA-C   Stopped at 11/28/21 2043   LORazepam (ATIVAN) tablet 1-4 mg  1-4 mg Oral Q1H PRN Orma Flaming, MD       Or   LORazepam (ATIVAN) injection 1-4 mg  1-4 mg Intravenous Q1H PRN Orma Flaming, MD       morphine (PF) 2 MG/ML injection 1 mg  1 mg Intravenous Q3H PRN Orma Flaming, MD       octreotide (SANDOSTATIN) 500 mcg in sodium chloride 0.9 % 250 mL (2 mcg/mL) infusion  50 mcg/hr Intravenous Continuous Domenic Moras, PA-C 25 mL/hr at 11/29/21 0400 50 mcg/hr at 11/29/21 0400   ondansetron (ZOFRAN) injection 4 mg  4 mg Intravenous Q6H PRN Domenic Moras, PA-C       pantoprozole (PROTONIX) 80 mg /NS 100 mL infusion  8 mg/hr Intravenous Continuous Domenic Moras, PA-C 10 mL/hr at 11/29/21 0359 8 mg/hr at 11/29/21 0359   thiamine tablet 100 mg  100 mg Oral Daily Orma Flaming, MD   100 mg at 11/28/21 2205   Or   thiamine (B-1) injection 100 mg  100 mg Intravenous Daily Orma Flaming, MD       Current Outpatient  Medications  Medication Sig Dispense Refill   CLEAR EYES MAX REDNESS RELIEF 0.03-0.5 % SOLN Place 1 drop into both eyes 3 (three) times daily as needed (for redness).     benzonatate (TESSALON) 100 MG capsule Take 1 capsule (100 mg total) by mouth 2 (two) times daily as needed for cough. (Patient not taking: Reported on 11/28/2021) 20 capsule 0   ergocalciferol (VITAMIN D2) 1.25 MG (50000 UT) capsule Take 1 capsule (50,000 Units total) by mouth once a week. Vit D deficiency - E55.9 (Patient not taking: Reported on 11/28/2021) 12 capsule 1   loratadine (CLARITIN) 10 MG tablet Take 1 tablet (10 mg total) by mouth daily. (Patient not taking: Reported on 11/28/2021) 15 tablet 0   nadolol (CORGARD) 20 MG tablet Take 1 tablet (20 mg total) by mouth daily. (Patient not taking: Reported on 11/28/2021) 90 tablet 3   sildenafil (VIAGRA) 50 MG tablet TAKE 1 TABLET BY MOUTH AS NEEDED HALF HOUR PRIOR TO INTERCOURSE LIMIT USE TO 1 TAB/24 HOURS (Patient not taking: Reported on 11/28/2021) 10 tablet 3    Allergies as of 11/28/2021   (No Known Allergies)    Review of Systems:    Constitutional: No weight loss, fever, chills, weakness or fatigue HEENT: Eyes: No change in vision               Ears, Nose, Throat:  No change in hearing or congestion Skin: No rash or itching Cardiovascular: No chest pain, chest pressure or palpitations   Respiratory: No SOB or cough Gastrointestinal: See HPI and otherwise negative Genitourinary: No dysuria or change in urinary frequency Neurological: No headache, dizziness or syncope Musculoskeletal: No new muscle or joint pain Hematologic: No bleeding or bruising Psychiatric: No history of depression or anxiety     Physical Exam:  Vital signs in last 24 hours: Temp:  [98 F (36.7 C)-98.4 F (36.9 C)] 98 F (36.7 C) (06/21 1610) Pulse Rate:  [72-114] 72 (06/22 0630) Resp:  [12-24] 12 (06/22 0630) BP: (123-150)/(69-87) 139/85 (06/22 0630) SpO2:  [94 %-100 %] 98 % (06/22  0630) Weight:  [95.3 kg] 95.3 kg (06/21 1251)   Last BM recorded by nurses in past 5 days No data recorded  General :  Alert, well developed male in no acute distress  Head:  Normocephalic and atraumatic. Eyes :  sclerae anicteric,conjunctive pink  Heart:  regular rate and rhythm Pulm:  Clear anteriorly; no wheezing Abdomen:   Soft, Obese AB, skin exam normal, Normal bowel sounds. mild tenderness in the epigastrium. Without guarding and Without rebound, without organomegaly. no  fluid wave, no  shifting dullness.  Extremities:   Without edema. Msk:  Symmetrical without gross deformities. Peripheral pulses intact.  Neurologic: Alert and  oriented x4;  grossly normal neurologically. without asterixis or clonus.  Skin:   without jaundice. no palmar erythema or spider angioma.   Psychiatric:  Demonstrates good judgement and reason without abnormal affect or behaviors.   LAB RESULTS: Recent Labs    11/28/21 1318 11/29/21 0143 11/29/21 0357  WBC 8.2  --  6.4  HGB 13.8 12.8* 11.7*  HCT 41.5 40.1 36.6*  PLT 73*  --  55*   BMET Recent Labs    11/28/21 1318 11/29/21 0357  NA 140 139  K 4.1 3.9  CL 109 109  CO2 22 23  GLUCOSE 122* 110*  BUN 22 19  CREATININE 0.98 1.06  CALCIUM 9.3 8.2*   LFT Recent Labs    11/29/21 0357  PROT 6.7  ALBUMIN 2.8*  AST 32  ALT 25  ALKPHOS 84  BILITOT 1.0   PT/INR Recent Labs    11/28/21 1318  LABPROT 15.3*  INR 1.2    STUDIES: CT ANGIO GI BLEED  Result Date: 11/28/2021 CLINICAL DATA:  Black tarsals for several hours, initial encounter EXAM: CTA ABDOMEN AND PELVIS WITHOUT AND WITH CONTRAST TECHNIQUE: Multidetector CT imaging of the abdomen and pelvis was performed using the standard protocol during bolus administration of intravenous contrast. Multiplanar reconstructed images and MIPs were obtained and reviewed to evaluate the vascular anatomy. RADIATION DOSE REDUCTION: This exam was performed according to the departmental  dose-optimization program which includes automated exposure control, adjustment of the mA and/or kV according to patient size and/or use of iterative reconstruction technique. CONTRAST:  113m OMNIPAQUE IOHEXOL 350 MG/ML SOLN COMPARISON:  None Available. FINDINGS: VASCULAR Aorta: Initial precontrast imaging shows no aneurysmal dilatation. Post-contrast images demonstrate no focal stenosis. Mild atherosclerotic calcifications are seen. Celiac: Patent without evidence of aneurysm, dissection, vasculitis or significant stenosis. SMA: Patent without evidence of aneurysm, dissection, vasculitis or significant stenosis. 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 Single renal artery is noted on the right. No focal stenosis or significant atherosclerotic changes noted. IMA: Patent without evidence of aneurysm, dissection, vasculitis or significant stenosis. Inflow: Iliacs are within normal limits. Veins: No specific venous abnormality is noted. Review of the MIP images confirms the above findings. NON-VASCULAR Lower chest: No acute abnormality. Hepatobiliary: The liver is diffusely nodular in appearance consistent with underlying cirrhosis. For no focal mass is seen. Gallbladder is within normal limits. Pancreas: Unremarkable. No pancreatic ductal dilatation or surrounding inflammatory changes. Spleen: Normal in size without focal abnormality. Adrenals/Urinary Tract: Adrenal glands are within normal limits. Kidneys demonstrate a normal enhancement pattern bilaterally. No renal calculi or obstructive changes are seen. The bladder is partially distended. Stomach/Bowel: Colon shows no obstructive or inflammatory changes. The appendix is within normal limits. Stomach is unremarkable. Some nodularity in the distal esophagus is noted with delayed enhancement consistent with esophageal varices. Small bowel demonstrates some hyperemia on the arterial phase images in what appears to be the mid to distal jejunum best seen on  images 66 through 75 of series 7. Delayed images show no significant pooling although the hyperemia may  be the site of prior hemorrhage. Lymphatic: No sizable lymphadenopathy is noted. Reproductive: Prostate is unremarkable. Other: No abdominal wall hernia or abnormality. No abdominopelvic ascites. Musculoskeletal: No acute or significant osseous findings. IMPRESSION: VASCULAR No acute arterial abnormality is noted. There is hyperemia within the wall in the jejunum for an extended distance as described. No delayed pooling is noted in these loops to suggest active hemorrhage although the area of hyperemia may represent the site of prior hemorrhage. NON-VASCULAR Changes consistent with cirrhosis of the liver with evidence of esophageal varices. This may also contribute to the patient's clinical symptomatology given the history of hematemesis. No other focal abnormality is noted. Electronically Signed   By: Inez Catalina M.D.   On: 11/28/2021 19:59     Vladimir Crofts  11/29/2021, 8:00 AM

## 2021-11-29 NOTE — Plan of Care (Signed)

## 2021-11-30 ENCOUNTER — Other Ambulatory Visit: Payer: Self-pay

## 2021-11-30 ENCOUNTER — Telehealth: Payer: Self-pay

## 2021-11-30 ENCOUNTER — Inpatient Hospital Stay (HOSPITAL_COMMUNITY): Payer: Medicare Other

## 2021-11-30 ENCOUNTER — Encounter (HOSPITAL_COMMUNITY): Payer: Self-pay | Admitting: Internal Medicine

## 2021-11-30 DIAGNOSIS — K7030 Alcoholic cirrhosis of liver without ascites: Secondary | ICD-10-CM

## 2021-11-30 DIAGNOSIS — I8511 Secondary esophageal varices with bleeding: Secondary | ICD-10-CM

## 2021-11-30 DIAGNOSIS — D696 Thrombocytopenia, unspecified: Secondary | ICD-10-CM

## 2021-11-30 DIAGNOSIS — K766 Portal hypertension: Secondary | ICD-10-CM

## 2021-11-30 DIAGNOSIS — I8510 Secondary esophageal varices without bleeding: Secondary | ICD-10-CM

## 2021-11-30 DIAGNOSIS — K922 Gastrointestinal hemorrhage, unspecified: Secondary | ICD-10-CM | POA: Diagnosis not present

## 2021-11-30 DIAGNOSIS — K3189 Other diseases of stomach and duodenum: Secondary | ICD-10-CM

## 2021-11-30 LAB — CBC
HCT: 35.5 % — ABNORMAL LOW (ref 39.0–52.0)
Hemoglobin: 11.6 g/dL — ABNORMAL LOW (ref 13.0–17.0)
MCH: 28.5 pg (ref 26.0–34.0)
MCHC: 32.7 g/dL (ref 30.0–36.0)
MCV: 87.2 fL (ref 80.0–100.0)
Platelets: 61 10*3/uL — ABNORMAL LOW (ref 150–400)
RBC: 4.07 MIL/uL — ABNORMAL LOW (ref 4.22–5.81)
RDW: 14.9 % (ref 11.5–15.5)
WBC: 7.5 10*3/uL (ref 4.0–10.5)
nRBC: 0 % (ref 0.0–0.2)

## 2021-11-30 LAB — COMPREHENSIVE METABOLIC PANEL
ALT: 26 U/L (ref 0–44)
AST: 39 U/L (ref 15–41)
Albumin: 3 g/dL — ABNORMAL LOW (ref 3.5–5.0)
Alkaline Phosphatase: 83 U/L (ref 38–126)
Anion gap: 6 (ref 5–15)
BUN: 10 mg/dL (ref 8–23)
CO2: 25 mmol/L (ref 22–32)
Calcium: 8.3 mg/dL — ABNORMAL LOW (ref 8.9–10.3)
Chloride: 106 mmol/L (ref 98–111)
Creatinine, Ser: 1.01 mg/dL (ref 0.61–1.24)
GFR, Estimated: >60 mL/min (ref >60)
Glucose, Bld: 116 mg/dL — ABNORMAL HIGH (ref 70–99)
Potassium: 4 mmol/L (ref 3.5–5.1)
Sodium: 137 mmol/L (ref 135–145)
Total Bilirubin: 1 mg/dL (ref 0.3–1.2)
Total Protein: 6.8 g/dL (ref 6.5–8.1)

## 2021-11-30 LAB — AFP TUMOR MARKER: AFP, Serum, Tumor Marker: 5 ng/mL (ref 0.0–8.4)

## 2021-11-30 NOTE — TOC Transition Note (Signed)
Transition of Care [facility]) - CM/SW Discharge Note   Patient Details  Name: Luis Morrison MRN: [id number] Date of Birth: August 08, 1955  Transition of Care [facility]) CM/SW Contact:  Beckie Busing, RN Phone Number:[phone]  11/30/2021, 4:01 PM   Clinical Narrative:     Transition of Care (TOC) Screening Note   Patient Details  Name: Luis Morrison Date of Birth: [birth year]   Transition of Care [facility]) CM/SW Contact:    Beckie Busing, RN Phone Number: 11/30/2021, 4:02 PM    Transition of Care Department [facility]) has reviewed patient and no TOC needs have been identified at this time. We will continue to monitor patient advancement through interdisciplinary progression rounds.           Patient Goals and CMS Choice        Discharge Placement                       Discharge Plan and Services                                     Social Determinants of Health (SDOH) Interventions     Readmission Risk Interventions     No data to display

## 2021-12-01 DIAGNOSIS — K922 Gastrointestinal hemorrhage, unspecified: Secondary | ICD-10-CM | POA: Diagnosis not present

## 2021-12-01 LAB — BASIC METABOLIC PANEL
Anion gap: 7 (ref 5–15)
BUN: 6 mg/dL — ABNORMAL LOW (ref 8–23)
CO2: 26 mmol/L (ref 22–32)
Calcium: 8.2 mg/dL — ABNORMAL LOW (ref 8.9–10.3)
Chloride: 105 mmol/L (ref 98–111)
Creatinine, Ser: 1.03 mg/dL (ref 0.61–1.24)
GFR, Estimated: >60 mL/min (ref >60)
Glucose, Bld: 116 mg/dL — ABNORMAL HIGH (ref 70–99)
Potassium: 3.9 mmol/L (ref 3.5–5.1)
Sodium: 138 mmol/L (ref 135–145)

## 2021-12-01 LAB — CBC
HCT: 34.8 % — ABNORMAL LOW (ref 39.0–52.0)
Hemoglobin: 11.6 g/dL — ABNORMAL LOW (ref 13.0–17.0)
MCH: 29.1 pg (ref 26.0–34.0)
MCHC: 33.3 g/dL (ref 30.0–36.0)
MCV: 87.4 fL (ref 80.0–100.0)
Platelets: 55 10*3/uL — ABNORMAL LOW (ref 150–400)
RBC: 3.98 MIL/uL — ABNORMAL LOW (ref 4.22–5.81)
RDW: 14.8 % (ref 11.5–15.5)
WBC: 6.2 10*3/uL (ref 4.0–10.5)
nRBC: 0 % (ref 0.0–0.2)

## 2021-12-01 MED ORDER — OXYCODONE HCL 5 MG PO TABS: 5.0000 mg | ORAL_TABLET | ORAL | Status: DC | PRN

## 2021-12-01 MED ORDER — AMOXICILLIN-POT CLAVULANATE 875-125 MG PO TABS
1.0000 | ORAL_TABLET | Freq: Two times a day (BID) | ORAL | Status: DC
Start: 2021-12-01 — End: 2021-12-02
  Administered 2021-12-01 – 2021-12-02 (×3): 1 via ORAL
  Filled 2021-12-01 (×4): qty 1

## 2021-12-01 MED ORDER — AMOXICILLIN-POT CLAVULANATE 875-125 MG PO TABS: 1.0000 | ORAL_TABLET | Freq: Two times a day (BID) | ORAL | Status: DC

## 2021-12-01 MED ORDER — PANTOPRAZOLE SODIUM 40 MG PO TBEC
40.0000 mg | DELAYED_RELEASE_TABLET | Freq: Two times a day (BID) | ORAL | Status: DC
  Administered 2021-12-02: 40 mg via ORAL
  Filled 2021-12-01: qty 1

## 2021-12-02 DIAGNOSIS — K922 Gastrointestinal hemorrhage, unspecified: Secondary | ICD-10-CM | POA: Diagnosis not present

## 2021-12-02 LAB — CBC
HCT: 31.3 % — ABNORMAL LOW (ref 39.0–52.0)
Hemoglobin: 10.4 g/dL — ABNORMAL LOW (ref 13.0–17.0)
MCH: 28.9 pg (ref 26.0–34.0)
MCHC: 33.2 g/dL (ref 30.0–36.0)
MCV: 86.9 fL (ref 80.0–100.0)
Platelets: 53 10*3/uL — ABNORMAL LOW (ref 150–400)
RBC: 3.6 MIL/uL — ABNORMAL LOW (ref 4.22–5.81)
RDW: 14.6 % (ref 11.5–15.5)
WBC: 5.4 10*3/uL (ref 4.0–10.5)
nRBC: 0 % (ref 0.0–0.2)

## 2021-12-02 MED ORDER — NADOLOL 20 MG PO TABS: 10.0000 mg | ORAL_TABLET | Freq: Every day | ORAL | 3 refills | Status: DC

## 2021-12-02 MED ORDER — AMOXICILLIN-POT CLAVULANATE 875-125 MG PO TABS: 1.0000 | ORAL_TABLET | Freq: Two times a day (BID) | ORAL | 0 refills | Status: AC

## 2021-12-02 MED ORDER — PANTOPRAZOLE SODIUM 40 MG PO TBEC: 40.0000 mg | DELAYED_RELEASE_TABLET | Freq: Two times a day (BID) | ORAL | 3 refills | Status: DC

## 2021-12-02 NOTE — TOC Transition Note (Signed)
Transition of Care [facility]) - CM/SW Discharge Note   Patient Details  Name: Luis Morrison MRN: [id number] Date of Birth: [birth year]  Transition of Care [facility]) CM/SW Contact:  Bess Kinds, RN Phone Number: [phone] 12/02/2021, 8:29 AM   Clinical Narrative:     Patient to transition home today. No TOC needs identified at this time.   Final next level of care: Home/Self Care Barriers to Discharge: No Barriers Identified   Patient Goals and CMS Choice        Discharge Placement                       Discharge Plan and Services                                     Social Determinants of Health (SDOH) Interventions     Readmission Risk Interventions     No data to display

## 2021-12-03 ENCOUNTER — Telehealth: Payer: Self-pay

## 2021-12-03 LAB — H. PYLORI ANTIBODY, IGG: H Pylori IgG: 5.54 Index Value — ABNORMAL HIGH (ref 0.00–0.79)

## 2021-12-04 ENCOUNTER — Other Ambulatory Visit: Payer: Self-pay

## 2021-12-04 DIAGNOSIS — A048 Other specified bacterial intestinal infections: Secondary | ICD-10-CM

## 2021-12-04 MED ORDER — TETRACYCLINE HCL 500 MG PO CAPS: 500.0000 mg | ORAL_CAPSULE | Freq: Four times a day (QID) | ORAL | 0 refills | Status: AC

## 2021-12-04 MED ORDER — METRONIDAZOLE 250 MG PO TABS: 250.0000 mg | ORAL_TABLET | Freq: Four times a day (QID) | ORAL | 0 refills | Status: AC

## 2021-12-04 MED ORDER — BISMUTH SUBSALICYLATE 262 MG PO TABS: 524.0000 mg | ORAL_TABLET | Freq: Four times a day (QID) | ORAL | 0 refills | Status: AC

## 2021-12-17 ENCOUNTER — Ambulatory Visit: Payer: Medicare Other | Admitting: Physician Assistant

## 2021-12-17 ENCOUNTER — Ambulatory Visit: Payer: Self-pay

## 2021-12-17 NOTE — Telephone Encounter (Signed)
  Chief Complaint: Itchy rash between legs Symptoms: ibid Frequency: 4 days Pertinent Negatives: Patient denies Fever Disposition: '[]'$ ED /'[]'$ Urgent Care (no appt availability in office) / '[]'$ Appointment(In office/virtual)/ '[]'$  [place] [facility]/ '[x]'$ Home Care/ '[]'$ Refused Recommended Disposition /'[]'$ [place] [facility]/ '[]'$  Follow-up with PCP Additional Notes: Reviewed care advice with pt. PT will go to pharmacy and get a different antifungal medication. Pt will c/b if symptoms do not resolve.   Summary: advice - jock itch   Pt called in with jock itch and wanted to know what he could take over the counter, please advise.      Reason for Disposition  [1] Slowly expanding pink-red rash involving inner thigh(s) next to scrotum AND [2] itchy  Answer Assessment - Initial Assessment Questions 1. APPEARANCE of RASH: "Describe the rash."      Red  2. LOCATION: "Where is the rash located?"      Between legs.  3. ONSET: "When did the rash start?"      4 days 4. ITCHING: "Does the rash itch?" If Yes, ask: "How bad is the itch?"  (Scale 1-10; or mild, moderate, severe)     Not now just at night 5. PAIN: "Does the rash hurt?" If Yes, ask: "How bad is the pain?"  (Scale 1-10; or mild, moderate, severe)     Burn a bit 6. OTHER SYMPTOMS: "Do you have any other symptoms?" (e.g., fever)     no 7. PREGNANCY: "Is there any chance you are pregnant?" "When was your last menstrual period?"     Na  Protocols used: Jock Itch-A-AH

## 2022-01-03 ENCOUNTER — Encounter (HOSPITAL_COMMUNITY): Payer: Self-pay | Admitting: Gastroenterology

## 2022-01-03 ENCOUNTER — Encounter: Payer: Self-pay | Admitting: Internal Medicine

## 2022-01-03 ENCOUNTER — Ambulatory Visit: Payer: Medicare Other | Attending: Internal Medicine | Admitting: Internal Medicine

## 2022-01-03 VITALS — BP 130/80 | HR 68 | Ht 70.0 in | Wt 199.6 lb

## 2022-01-03 DIAGNOSIS — E663 Overweight: Secondary | ICD-10-CM

## 2022-01-03 DIAGNOSIS — K7460 Unspecified cirrhosis of liver: Secondary | ICD-10-CM

## 2022-01-03 DIAGNOSIS — R7303 Prediabetes: Secondary | ICD-10-CM

## 2022-01-03 DIAGNOSIS — F1021 Alcohol dependence, in remission: Secondary | ICD-10-CM | POA: Diagnosis not present

## 2022-01-03 DIAGNOSIS — I8510 Secondary esophageal varices without bleeding: Secondary | ICD-10-CM

## 2022-01-03 DIAGNOSIS — Z09 Encounter for follow-up examination after completed treatment for conditions other than malignant neoplasm: Secondary | ICD-10-CM

## 2022-01-03 DIAGNOSIS — D649 Anemia, unspecified: Secondary | ICD-10-CM | POA: Diagnosis not present

## 2022-01-03 DIAGNOSIS — A048 Other specified bacterial intestinal infections: Secondary | ICD-10-CM

## 2022-01-03 MED ORDER — AMOXICILLIN 500 MG PO CAPS: 1000.0000 mg | ORAL_CAPSULE | Freq: Two times a day (BID) | ORAL | 0 refills | Status: DC

## 2022-01-03 MED ORDER — CLARITHROMYCIN 500 MG PO TABS: 500.0000 mg | ORAL_TABLET | Freq: Two times a day (BID) | ORAL | 0 refills | Status: DC

## 2022-01-03 NOTE — Progress Notes (Signed)
Patient ID: Luis Morrison, male    DOB: 09-10-1955  MRN: [id number]  CC: Hospitalization Follow-up   Subjective: Luis Morrison is a 66 y.o. male who presents for hospital follow-up His concerns today include:  Hx Hep C status post successful treatment with Harvoni, cirrhosis, GERD, Vit D def, ETOH abuse, preDM.  Patient hospitalized 6/21-25/2023 with hematemesis secondary to large esophageal varices greater than 5 mm in size on EGD.  S/P banding x10.  Hemoglobin trended down from 13.8 to 10.4 by the time of discharge.  PLT count remained low in the 50s.  Did not require transfusion.  Tested positive for H. pylori.  Postdischarge he was prescribed treatment course with tetracycline, metronidazole, but this month and PPI.  He has bottles with him today.  He has not been taking the metronidazole and tetracycline as prescribed.  Reports that tetracycline gives him diarrhea really bad.   Liver ultrasound showed cirrhotic hepatic morphology with mild splenomegaly. A1c was 5.7 in the range for prediabetes.  Today: He has not had any further hematemesis.  Denies any dizziness or fatigue. -Stop drinking alcoholic beverages completely 2 months ago.  States that this was a wake-up call for him. -He is scheduled to see the gastroenterologist on 1 August and for repeat endoscopy on 01/10/2022 at [facility].  Patient was not aware of these appointments.  I told him that I will have them print these out and give it to him on his discharge summary -He is taking nadolol and pantoprazole 40 mg twice a day. -Weight is down 11 pounds since hospitalization.  He attributes this to not being able to eat regular food for several days during and after the esophageal bleed.  States he was eating soup and Jell-O for a while.  Now back to eating regular foods.  Does his own cooking and does not eat out much.  Despite weight loss, BMI still above normal range.  HM: Due for shingles vaccine and Medicare wellness  visit. Patient Active Problem List   Diagnosis Date Noted   Secondary esophageal varices with bleeding ([place])    Portal hypertensive gastropathy ([place])    UGIB (upper gastrointestinal bleed) 11/28/2021   Alcohol abuse with other alcohol-induced disorder ([place]) 09/02/2019   Multiple fractures of ribs, right side, init for clos fx 12/20/2018   Thrombocytopenia ([place]) 12/20/2018   Pneumothorax on right 12/20/2018   Compensated HCV cirrhosis ([place]) 12/20/2018   Cortical age-related cataract of both eyes 10/09/2017   Secondary esophageal varices without bleeding ([place]) 05/30/2017   Prediabetes 02/26/2017   Alcohol abuse 08/06/2016   Vitamin D deficiency 12/06/2015   Cirrhosis of liver without ascites ([place]) 10/09/2014   Hepatitis C virus infection cured after antiviral drug therapy 10/09/2014     Current Outpatient Medications on File Prior to Visit  Medication Sig Dispense Refill   benzonatate (TESSALON) 100 MG capsule Take 1 capsule (100 mg total) by mouth 2 (two) times daily as needed for cough. 20 capsule 0   CLEAR EYES MAX REDNESS RELIEF 0.03-0.5 % SOLN Place 1 drop into both eyes 3 (three) times daily as needed (for redness).     ergocalciferol (VITAMIN D2) 1.25 MG (50000 UT) capsule Take 1 capsule (50,000 Units total) by mouth once a week. Vit D deficiency - E55.9 12 capsule 1   loratadine (CLARITIN) 10 MG tablet Take 1 tablet (10 mg total) by mouth daily. 15 tablet 0   nadolol (CORGARD) 20 MG tablet Take 0.5 tablets (10 mg  total) by mouth daily. 90 tablet 3   pantoprazole (PROTONIX) 40 MG tablet Take 1 tablet (40 mg total) by mouth 2 (two) times daily. 60 tablet 3   sildenafil (VIAGRA) 50 MG tablet TAKE 1 TABLET BY MOUTH AS NEEDED HALF HOUR PRIOR TO INTERCOURSE LIMIT USE TO 1 TAB/24 HOURS 10 tablet 3   No current facility-administered medications on file prior to visit.    No Known Allergies  Social History   Socioeconomic History   Marital status: Single    Spouse name: Not on  file   Number of children: Not on file   Years of education: Not on file   Highest education level: Not on file  Occupational History   Not on file  Tobacco Use   Smoking status: Never   Smokeless tobacco: Never  Substance and Sexual Activity   Alcohol use: Yes    Alcohol/week: 2.0 standard drinks of alcohol    Types: 2 Cans of beer per week   Drug use: No   Sexual activity: Not on file  Other Topics Concern   Not on file  Social History Narrative   Not on file   Social Determinants of Health   Financial Resource Strain: Not on file  Food Insecurity: Not on file  Transportation Needs: Not on file  Physical Activity: Not on file  Stress: Not on file  Social Connections: Not on file  Intimate Partner Violence: Not on file    Family History  Problem Relation Age of Onset   Cancer Mother    Brain cancer Mother    Colon cancer Neg Hx    Colon polyps Neg Hx    Esophageal cancer Neg Hx    Rectal cancer Neg Hx    Stomach cancer Neg Hx     Past Surgical History:  Procedure Laterality Date   ESOPHAGEAL BANDING  11/29/2021   Procedure: ESOPHAGEAL BANDING;  Surgeon: Sharyn Creamer, MD;  Location: [facility] ENDOSCOPY;  Service: Gastroenterology;;   ESOPHAGOGASTRODUODENOSCOPY (EGD) WITH PROPOFOL N/A 11/29/2021   Procedure: ESOPHAGOGASTRODUODENOSCOPY (EGD) WITH PROPOFOL;  Surgeon: Sharyn Creamer, MD;  Location: [facility];  Service: Gastroenterology;  Laterality: N/A;    ROS: Review of Systems Negative except as stated above  PHYSICAL EXAM: BP 130/80   Pulse 68   Ht '5\' 10"'$  (1.778 m)   Wt 199 lb 9.6 oz (90.5 kg)   SpO2 100%   BMI 28.64 kg/m   Wt Readings from Last 3 Encounters:  01/03/22 199 lb 9.6 oz (90.5 kg)  11/29/21 210 lb 1.6 oz (95.3 kg)  05/28/21 207 lb 3.2 oz (94 kg)    Physical Exam   General appearance - alert, well appearing, older African-American male and in no distress Mental status - normal mood, behavior, speech, dress, motor activity, and thought  processes Eyes - pupils equal and reactive, extraocular eye movements intact Neck - supple, no significant adenopathy Chest - clear to auscultation, no wheezes, rales or rhonchi, symmetric air entry Heart - normal rate, regular rhythm, normal S1, S2, no murmurs, rubs, clicks or gallops Abdomen - soft, nontender, nondistended, no masses or organomegaly Extremities - peripheral pulses normal, no pedal edema, no clubbing or cyanosis     Latest Ref Rng & Units 12/01/2021    2:36 AM 11/30/2021    1:20 AM 11/29/2021    3:57 AM  CMP  Glucose 70 - 99 mg/dL 116  116  110   BUN 8 - 23 mg/dL 6  10  19  Creatinine 0.61 - 1.24 mg/dL 1.03  1.01  1.06   Sodium 135 - 145 mmol/L 138  137  139   Potassium 3.5 - 5.1 mmol/L 3.9  4.0  3.9   Chloride 98 - 111 mmol/L 105  106  109   CO2 22 - 32 mmol/L '26  25  23   '$ Calcium 8.9 - 10.3 mg/dL 8.2  8.3  8.2   Total Protein 6.5 - 8.1 g/dL  6.8  6.7   Total Bilirubin 0.3 - 1.2 mg/dL  1.0  1.0   Alkaline Phos 38 - 126 U/L  83  84   AST 15 - 41 U/L  39  32   ALT 0 - 44 U/L  26  25    Lipid Panel     Component Value Date/Time   CHOL 174 02/19/2021 1404   TRIG 88 02/19/2021 1404   HDL 71 02/19/2021 1404   CHOLHDL 2.5 02/19/2021 1404   LDLCALC 87 02/19/2021 1404    CBC    Component Value Date/Time   WBC 5.4 12/02/2021 0422   RBC 3.60 (L) 12/02/2021 0422   HGB 10.4 (L) 12/02/2021 0422   HGB 15.0 02/19/2021 1404   HCT 31.3 (L) 12/02/2021 0422   HCT 44.7 02/19/2021 1404   PLT 53 (L) 12/02/2021 0422   PLT 63 (LL) 02/19/2021 1404   MCV 86.9 12/02/2021 0422   MCV 86 02/19/2021 1404   MCH 28.9 12/02/2021 0422   MCHC 33.2 12/02/2021 0422   RDW 14.6 12/02/2021 0422   RDW 14.4 02/19/2021 1404   LYMPHSABS 1.0 01/20/2019 1529   MONOABS 357 07/30/2016 1126   EOSABS 0.0 01/20/2019 1529   BASOSABS 0.0 01/20/2019 1529    ASSESSMENT AND PLAN: 1. Hospital discharge follow-up  2. Esophageal varices in cirrhosis (HCC) Patient to continue nadolol and  Protonix. Informed him of his upcoming appointments 1 with the gastroenterologist and then the second 1 at [facility] on the third of next month for repeat EGD.  Encouraged him to mark these dates and time on his calendar so that he does not miss these appointments.  3. Normocytic anemia We will recheck CBC to see whether hemoglobin has stabilized or improved. - CBC  4. Alcohol dependence in early full remission ([place]) Commended him on quitting.  Encouraged him to remain free of alcohol  5. Bacterial infection due to H. pylori Patient needs treatment.  He did not tolerate the treatment that included tetracycline and metronidazole.  We will change to triple therapy with amoxicillin, clarithromycin for 2 weeks and continuing the Protonix. Advised that there is an interaction between clarithromycin and Viagra.  I request that he hold off on taking the Viagra for the 2 weeks that he is on the antibiotics.  He expressed understanding. - amoxicillin (AMOXIL) 500 MG capsule; Take 2 capsules (1,000 mg total) by mouth 2 (two) times daily.  Dispense: 56 capsule; Refill: 0 - clarithromycin (BIAXIN) 500 MG tablet; Take 1 tablet (500 mg total) by mouth 2 (two) times daily.  Dispense: 28 tablet; Refill: 0  6. Prediabetes Commended him on healthy eating habits.  Encouraged him to continue cooking his food rather than buying out.  Advised to avoid sugary drinks.  Encouraged him to get in fresh fruits and vegetables during the day.  7. Overweight (BMI 25.0-29.9) See #6 above.  Discussed regular exercise like brisk walking 3 to 5 days a week for 30 minutes.    Patient was given the opportunity to  ask questions.  Patient verbalized understanding of the plan and was able to repeat key elements of the plan.   This documentation was completed using Radio producer.  Any transcriptional errors are unintentional.  Orders Placed This Encounter  Procedures   CBC     Requested  Prescriptions   Signed Prescriptions Disp Refills   amoxicillin (AMOXIL) 500 MG capsule 56 capsule 0    Sig: Take 2 capsules (1,000 mg total) by mouth 2 (two) times daily.   clarithromycin (BIAXIN) 500 MG tablet 28 tablet 0    Sig: Take 1 tablet (500 mg total) by mouth 2 (two) times daily.    Return in about 1 month (around 02/03/2022) for AWV.  Karle Plumber, MD, FACP

## 2022-01-03 NOTE — Patient Instructions (Signed)
I have sent prescription to your pharmacy for your 2 different antibiotics to treat the stomach bacteria.  One is called amoxicillin and the other one is called clarithromycin.  You will take both medicines twice a day for 14 days.  Please hold off on taking Viagra while you are on these antibiotics due to significant interaction.  Please keep your appointment at 7:30 AM at [facility] on 01/10/2022 for repeat endoscopy to look in your esophagus.  Please keep your appointment with [facility] gastroenterology on 01/08/2022 at 10:30 AM.

## 2022-01-04 LAB — CBC
Hematocrit: 36.8 % — ABNORMAL LOW (ref 37.5–51.0)
Hemoglobin: 11.9 g/dL — ABNORMAL LOW (ref 13.0–17.7)
MCH: 27.1 pg (ref 26.6–33.0)
MCHC: 32.3 g/dL (ref 31.5–35.7)
MCV: 84 fL (ref 79–97)
Platelets: 80 10*3/uL — CL (ref 150–450)
RBC: 4.39 x10E6/uL (ref 4.14–5.80)
RDW: 14.1 % (ref 11.6–15.4)
WBC: 4.3 10*3/uL (ref 3.4–10.8)

## 2022-01-08 ENCOUNTER — Ambulatory Visit: Payer: Medicare Other | Admitting: Physician Assistant

## 2022-01-09 NOTE — OR Nursing (Signed)
Patient states he will not be at his appointment tomorrow 01/10/2022.  No reason given for cancellation. States he already told the office he would not be here.  Appointment cancelled and Dr. Loletha Carrow notified.

## 2022-01-10 ENCOUNTER — Telehealth: Payer: Self-pay | Admitting: Gastroenterology

## 2022-01-10 ENCOUNTER — Ambulatory Visit (HOSPITAL_COMMUNITY): Admission: RE | Admit: 2022-01-10 | Payer: Medicare Other | Source: Home / Self Care | Admitting: Gastroenterology

## 2022-01-10 SURGERY — ESOPHAGOGASTRODUODENOSCOPY (EGD) WITH PROPOFOL
Anesthesia: Monitor Anesthesia Care

## 2022-01-10 NOTE — Telephone Encounter (Signed)
Patient canceled [facility] outpatient EGD scheduled for 01/10/2022.  (He was called by endoscopy nursing to confirm appointment and stated he did not plan to come to the appointment, giving no reason for cancellation. )  He also did not show for 12/17/2021 [facility] clinic visit.  - H. Loletha Carrow, MD

## 2022-02-08 ENCOUNTER — Encounter: Payer: Medicare Other | Admitting: Internal Medicine

## 2022-07-01 ENCOUNTER — Ambulatory Visit: Payer: Medicare Other | Admitting: Internal Medicine

## 2022-07-19 ENCOUNTER — Ambulatory Visit: Payer: 59 | Admitting: Internal Medicine

## 2022-08-13 ENCOUNTER — Ambulatory Visit: Payer: 59 | Admitting: Internal Medicine

## 2022-08-16 ENCOUNTER — Ambulatory Visit: Payer: 59 | Attending: Internal Medicine | Admitting: Internal Medicine

## 2022-09-04 ENCOUNTER — Telehealth: Payer: Self-pay | Admitting: Internal Medicine

## 2022-09-04 NOTE — Telephone Encounter (Signed)
Called & spoke to the patient. Verified name & DOB. Patient stated that he needs a letter explaining health conditions to give to his attorney to for the judge. No further information was given. Cb #: [phone].

## 2022-09-04 NOTE — Telephone Encounter (Signed)
Copied from [place] [id number]. Topic: General - Other >> Sep 04, 2022  1:24 PM Ja-Kwan M wrote: Reason for CRM: Pt requests a letter that explains his health conditions so he can provide it to his attorney. Pt requests call back once letter is ready for pick up. Cb# [phone]

## 2022-09-09 ENCOUNTER — Ambulatory Visit: Payer: Self-pay | Admitting: *Deleted

## 2022-09-09 NOTE — Telephone Encounter (Signed)
Summary: Possible Covid symptoms   Pt believes he has covid, requesting a call back from a nurse. Coughing, fever, cold, hoarse, sneezing, symptoms remind him of when he had covid last time.          Chief Complaint: covid like sx requesting appt.  Symptoms: cough productive clear sputum. Cold , chills then gets hot. Sneezing. Chest pain x 1 laying down, denies chest pain now  Frequency: 3 days Pertinent Negatives: Patient denies chest pain no difficulty breathing, no report of elevated temp. Disposition: [] ED /[x] Urgent Care (no appt availability in office) / [] Appointment(In office/virtual)/ []  Door Virtual Care/ [] Home Care/ [] Refused Recommended Disposition /[] [place] [facility]/ []  Follow-up with PCP Additional Notes:   Recommended UC to confirm if covid or flu. Patient reports he would rather see PCP. No available appt until 09/25/22 with any provider. Unsure of disposition. Patient  does not want to go to UC.  No mobile unit available today     Reason for Disposition  [1] HIGH RISK patient (e.g., weak immune system, age > 30 years, obesity with BMI 30 or higher, pregnant, chronic lung disease or other chronic medical condition) AND [2] COVID symptoms (e.g., cough, fever)  (Exceptions: Already seen by PCP and no new or worsening symptoms.)  Answer Assessment - Initial Assessment Questions 1. COVID-19 DIAGNOSIS: "How do you know that you have COVID?" (e.g., positive lab test or self-test, diagnosed by doctor or NP/PA, symptoms after exposure).     Has not tested for covid  2. COVID-19 EXPOSURE: "Was there any known exposure to COVID before the symptoms began?" CDC Definition of close contact: within 6 feet (2 meters) for a total of 15 minutes or more over a 24-hour period.      na 3. ONSET: "When did the COVID-19 symptoms start?"      3 days  4. WORST SYMPTOM: "What is your worst symptom?" (e.g., cough, fever, shortness of breath, muscle aches)     Cough , chest pain  other night layed down . Chills  5. COUGH: "Do you have a cough?" If Yes, ask: "How bad is the cough?"       Yes  6. FEVER: "Do you have a fever?" If Yes, ask: "What is your temperature, how was it measured, and when did it start?"     na 7. RESPIRATORY STATUS: "Describe your breathing?" (e.g., normal; shortness of breath, wheezing, unable to speak)      Normal  8. BETTER-SAME-WORSE: "Are you getting better, staying the same or getting worse compared to yesterday?"  If getting worse, ask, "In what way?"     na 9. OTHER SYMPTOMS: "Do you have any other symptoms?"  (e.g., chills, fatigue, headache, loss of smell or taste, muscle pain, sore throat)     Sneezing sore throat, cough . 10. HIGH RISK DISEASE: "Do you have any chronic medical problems?" (e.g., asthma, heart or lung disease, weak immune system, obesity, etc.)       no 11. VACCINE: "Have you had the COVID-19 vaccine?" If Yes, ask: "Which one, how many shots, when did you get it?"       X 1  12. PREGNANCY: "Is there any chance you are pregnant?" "When was your last menstrual period?"       na 13. O2 SATURATION MONITOR:  "Do you use an oxygen saturation monitor (pulse oximeter) at home?" If Yes, ask "What is your reading (oxygen level) today?" "What is your usual oxygen saturation reading?" (e.g., 95%)  na  Protocols used: Coronavirus (IRWER-15) Diagnosed or Suspected-A-AH

## 2022-09-09 NOTE — Telephone Encounter (Signed)
Spoke with patient. Has C/O productive  cough,  sneezing NS  chills with no fever. Voices that this is how he felt when he had COVID the last time. Patient has not taken a COVID test. Advised to impotence of having a Covid test preformed it he feels that he has Covid , so that he can be treated properly. Patient voiced that he was going to UC or He would get  home test. He voiced that he would let us know the outcome so we can schedule a visit if needed.

## 2022-09-18 NOTE — Telephone Encounter (Signed)
Patient has appointment scheduled for 10/31/2022. No earlier appointments available at this time.

## 2022-10-10 ENCOUNTER — Telehealth: Payer: Self-pay | Admitting: Internal Medicine

## 2022-10-10 NOTE — Telephone Encounter (Signed)
Contacted [name] to schedule their annual wellness visit. Appointment made for 10/15/2022.  Thank you,  [facility] Support [facility] Direct dial  [phone]

## 2022-10-15 ENCOUNTER — Ambulatory Visit: Payer: 59 | Attending: Internal Medicine

## 2022-10-15 VITALS — Ht 70.0 in | Wt 199.0 lb

## 2022-10-15 DIAGNOSIS — Z0000 Encounter for general adult medical examination without abnormal findings: Secondary | ICD-10-CM | POA: Diagnosis not present

## 2022-10-15 NOTE — Patient Instructions (Signed)
Luis Morrison , Thank you for taking time to come for your Medicare Wellness Visit. I appreciate your ongoing commitment to your health goals. Please review the following plan we discussed and let me know if I can assist you in the future.   These are the goals we discussed:  Goals      DIET - INCREASE WATER INTAKE     Remain active and independent        This is a list of the screening recommended for you and due dates:  Health Maintenance  Topic Date Due   COVID-19 Vaccine (1) Never done   Zoster (Shingles) Vaccine (1 of 2) Never done   Flu Shot  01/09/2023   Medicare Annual Wellness Visit  10/15/2023   Colon Cancer Screening  05/09/2027   DTaP/Tdap/Td vaccine (3 - Td or Tdap) 12/19/2028   Pneumonia Vaccine  Completed   Hepatitis C Screening: USPSTF Recommendation to screen - Ages 69-69 yo.  Completed   HPV Vaccine  Aged Out    Advanced directives: Information on Advanced Care Planning can be found at [facility] Advance Health Care Directives Advance Health Care Directives ([email or web address]   Conditions/risks identified: Aim for 30 minutes of exercise or brisk walking, 6-8 glasses of water, and 5 servings of fruits and vegetables each day.  Next appointment: Follow up in one year for your annual wellness visit    Preventive Care 65 Years and Older, Male Preventive care refers to lifestyle choices and visits with your health care provider that can promote health and wellness. What does preventive care include? A yearly physical exam. This is also called an annual well check. Dental exams once or twice a year. Routine eye exams. Ask your health care provider how often you should have your eyes checked. Personal lifestyle choices, including: Daily care of your teeth and gums. Regular physical activity. Eating a healthy diet. Avoiding tobacco and drug use. Limiting alcohol use. Practicing safe sex. Taking low-dose aspirin every day. Taking vitamin and  mineral supplements as recommended by your health care provider. What happens during an annual well check? The services and screenings done by your health care provider during your annual well check will depend on your age, overall health, lifestyle risk factors, and family history of disease. Counseling  Your health care provider may ask you questions about your: Alcohol use. Tobacco use. Drug use. Emotional well-being. Home and relationship well-being. Sexual activity. Eating habits. History of falls. Memory and ability to understand (cognition). Work and work Astronomer. Reproductive health. Screening  You may have the following tests or measurements: Height, weight, and BMI. Blood pressure. Lipid and cholesterol levels. These may be checked every 5 years, or more frequently if you are over 28 years old. Skin check. Lung cancer screening. You may have this screening every year starting at age 101 if you have a 30-pack-year history of smoking and currently smoke or have quit within the past 15 years. Fecal occult blood test (FOBT) of the stool. You may have this test every year starting at age 55. Flexible sigmoidoscopy or colonoscopy. You may have a sigmoidoscopy every 5 years or a colonoscopy every 10 years starting at age 17. Hepatitis C blood test. Hepatitis B blood test. Sexually transmitted disease (STD) testing. Diabetes screening. This is done by checking your blood sugar (glucose) after you have not eaten for a while (fasting). You may have this done every 1-3 years. Bone density scan. This is done to screen for  osteoporosis. You may have this done starting at age 62. Mammogram. This may be done every 1-2 years. Talk to your health care provider about how often you should have regular mammograms. Talk with your health care provider about your test results, treatment options, and if necessary, the need for more tests. Vaccines  Your health care provider may recommend  certain vaccines, such as: Influenza vaccine. This is recommended every year. Tetanus, diphtheria, and acellular pertussis (Tdap, Td) vaccine. You may need a Td booster every 10 years. Zoster vaccine. You may need this after age 69. Pneumococcal 13-valent conjugate (PCV13) vaccine. One dose is recommended after age 69. Pneumococcal polysaccharide (PPSV23) vaccine. One dose is recommended after age 69. Talk to your health care provider about which screenings and vaccines you need and how often you need them. This information is not intended to replace advice given to you by your health care provider. Make sure you discuss any questions you have with your health care provider. Document Released: 06/23/2015 Document Revised: 02/14/2016 Document Reviewed: 03/28/2015 Elsevier Interactive Patient Education  2017 [employer].  Fall Prevention in the Home Falls can cause injuries. They can happen to people of all ages. There are many things you can do to make your home safe and to help prevent falls. What can I do on the outside of my home? Regularly fix the edges of walkways and driveways and fix any cracks. Remove anything that might make you trip as you walk through a door, such as a raised step or threshold. Trim any bushes or trees on the path to your home. Use bright outdoor lighting. Clear any walking paths of anything that might make someone trip, such as rocks or tools. Regularly check to see if handrails are loose or broken. Make sure that both sides of any steps have handrails. Any raised decks and porches should have guardrails on the edges. Have any leaves, snow, or ice cleared regularly. Use sand or salt on walking paths during winter. Clean up any spills in your garage right away. This includes oil or grease spills. What can I do in the bathroom? Use night lights. Install grab bars by the toilet and in the tub and shower. Do not use towel bars as grab bars. Use non-skid mats or  decals in the tub or shower. If you need to sit down in the shower, use a plastic, non-slip stool. Keep the floor dry. Clean up any water that spills on the floor as soon as it happens. Remove soap buildup in the tub or shower regularly. Attach bath mats securely with double-sided non-slip rug tape. Do not have throw rugs and other things on the floor that can make you trip. What can I do in the bedroom? Use night lights. Make sure that you have a light by your bed that is easy to reach. Do not use any sheets or blankets that are too big for your bed. They should not hang down onto the floor. Have a firm chair that has side arms. You can use this for support while you get dressed. Do not have throw rugs and other things on the floor that can make you trip. What can I do in the kitchen? Clean up any spills right away. Avoid walking on wet floors. Keep items that you use a lot in easy-to-reach places. If you need to reach something above you, use a strong step stool that has a grab bar. Keep electrical cords out of the way. Do not  use floor polish or wax that makes floors slippery. If you must use wax, use non-skid floor wax. Do not have throw rugs and other things on the floor that can make you trip. What can I do with my stairs? Do not leave any items on the stairs. Make sure that there are handrails on both sides of the stairs and use them. Fix handrails that are broken or loose. Make sure that handrails are as long as the stairways. Check any carpeting to make sure that it is firmly attached to the stairs. Fix any carpet that is loose or worn. Avoid having throw rugs at the top or bottom of the stairs. If you do have throw rugs, attach them to the floor with carpet tape. Make sure that you have a light switch at the top of the stairs and the bottom of the stairs. If you do not have them, ask someone to add them for you. What else can I do to help prevent falls? Wear shoes that: Do not  have high heels. Have rubber bottoms. Are comfortable and fit you well. Are closed at the toe. Do not wear sandals. If you use a stepladder: Make sure that it is fully opened. Do not climb a closed stepladder. Make sure that both sides of the stepladder are locked into place. Ask someone to hold it for you, if possible. Clearly mark and make sure that you can see: Any grab bars or handrails. First and last steps. Where the edge of each step is. Use tools that help you move around (mobility aids) if they are needed. These include: Canes. Walkers. Scooters. Crutches. Turn on the lights when you go into a dark area. Replace any light bulbs as soon as they burn out. Set up your furniture so you have a clear path. Avoid moving your furniture around. If any of your floors are uneven, fix them. If there are any pets around you, be aware of where they are. Review your medicines with your doctor. Some medicines can make you feel dizzy. This can increase your chance of falling. Ask your doctor what other things that you can do to help prevent falls. This information is not intended to replace advice given to you by your health care provider. Make sure you discuss any questions you have with your health care provider. Document Released: 03/23/2009 Document Revised: 11/02/2015 Document Reviewed: 07/01/2014 Elsevier Interactive Patient Education  2017 [employer].

## 2022-10-15 NOTE — Progress Notes (Signed)
Subjective:   Luis Morrison is a 67 y.o. male who presents for Medicare Annual/Subsequent preventive examination.  I connected with  Luis Morrison on 10/15/22 by a audio enabled telemedicine application and verified that I am speaking with the correct person using two identifiers.  Patient Location: Home  Provider Location: Home Office  I discussed the limitations of evaluation and management by telemedicine. The patient expressed understanding and agreed to proceed.  Review of Systems     Cardiac Risk Factors include: male gender     Objective:    Today's Vitals   10/15/22 1339  Weight: 199 lb (90.3 kg)  Height: 5\' 10"  (1.778 m)   Body mass index is 28.55 kg/m.     10/15/2022    1:43 PM 11/29/2021   10:06 AM 09/25/2021    3:55 PM 01/04/2020    3:01 PM 12/20/2018    3:50 AM 12/20/2018    1:23 AM 05/08/2017    9:40 AM  Advanced Directives  Does Patient Have a Medical Advance Directive? No No No No No No No  Would patient like information on creating a medical advance directive? Yes (MAU/Ambulatory/Procedural Areas - Information given)  No - Patient declined No - Patient declined No - Patient declined No - Patient declined     Current Medications (verified) Outpatient Encounter Medications as of 10/15/2022  Medication Sig   CLEAR EYES MAX REDNESS RELIEF 0.03-0.5 % SOLN Place 1 drop into both eyes 3 (three) times daily as needed (for redness). (Patient not taking: Reported on 10/15/2022)   ergocalciferol (VITAMIN D2) 1.25 MG (50000 UT) capsule Take 1 capsule (50,000 Units total) by mouth once a week. Vit D deficiency - E55.9 (Patient not taking: Reported on 10/15/2022)   loratadine (CLARITIN) 10 MG tablet Take 1 tablet (10 mg total) by mouth daily. (Patient not taking: Reported on 10/15/2022)   nadolol (CORGARD) 20 MG tablet Take 0.5 tablets (10 mg total) by mouth daily. (Patient not taking: Reported on 10/15/2022)   pantoprazole (PROTONIX) 40 MG tablet Take 1 tablet (40 mg total) by  mouth 2 (two) times daily. (Patient not taking: Reported on 10/15/2022)   sildenafil (VIAGRA) 50 MG tablet TAKE 1 TABLET BY MOUTH AS NEEDED HALF HOUR PRIOR TO INTERCOURSE LIMIT USE TO 1 TAB/24 HOURS (Patient not taking: Reported on 10/15/2022)   [DISCONTINUED] amoxicillin (AMOXIL) 500 MG capsule Take 2 capsules (1,000 mg total) by mouth 2 (two) times daily.   [DISCONTINUED] benzonatate (TESSALON) 100 MG capsule Take 1 capsule (100 mg total) by mouth 2 (two) times daily as needed for cough.   [DISCONTINUED] clarithromycin (BIAXIN) 500 MG tablet Take 1 tablet (500 mg total) by mouth 2 (two) times daily.   No facility-administered encounter medications on file as of 10/15/2022.    Allergies (verified) Patient has no known allergies.   History: Past Medical History:  Diagnosis Date   Cirrhosis (HCC) 10/09/2014   Past Surgical History:  Procedure Laterality Date   ESOPHAGEAL BANDING  11/29/2021   Procedure: ESOPHAGEAL BANDING;  Surgeon: Imogene Burn, MD;  Location: [facility] ENDOSCOPY;  Service: Gastroenterology;;   ESOPHAGOGASTRODUODENOSCOPY (EGD) WITH PROPOFOL N/A 11/29/2021   Procedure: ESOPHAGOGASTRODUODENOSCOPY (EGD) WITH PROPOFOL;  Surgeon: Imogene Burn, MD;  Location: [facility] ENDOSCOPY;  Service: Gastroenterology;  Laterality: N/A;   Family History  Problem Relation Age of Onset   Cancer Mother    Brain cancer Mother    Colon cancer Neg Hx    Colon polyps Neg Hx  Esophageal cancer Neg Hx    Rectal cancer Neg Hx    Stomach cancer Neg Hx    Social History   Socioeconomic History   Marital status: Single    Spouse name: Not on file   Number of children: Not on file   Years of education: Not on file   Highest education level: Not on file  Occupational History   Not on file  Tobacco Use   Smoking status: Never   Smokeless tobacco: Never  Substance and Sexual Activity   Alcohol use: Yes    Alcohol/week: 2.0 standard drinks of alcohol    Types: 2 Cans of beer per week   Drug use: No    Sexual activity: Not on file  Other Topics Concern   Not on file  Social History Narrative   Not on file   Social Determinants of Health   Financial Resource Strain: Low Risk  (10/15/2022)   Overall Financial Resource Strain (CARDIA)    Difficulty of Paying Living Expenses: Not hard at all  Food Insecurity: No Food Insecurity (10/15/2022)   Hunger Vital Sign    Worried About Running Out of Food in the Last Year: Never true    Ran Out of Food in the Last Year: Never true  Transportation Needs: No Transportation Needs (10/15/2022)   PRAPARE - Administrator, Civil Service (Medical): No    Lack of Transportation (Non-Medical): No  Physical Activity: Sufficiently Active (10/15/2022)   Exercise Vital Sign    Days of Exercise per Week: 5 days    Minutes of Exercise per Session: 30 min  Stress: No Stress Concern Present (10/15/2022)   [employer] of Occupational Health - Occupational Stress Questionnaire    Feeling of Stress : Not at all  Social Connections: Moderately Isolated (10/15/2022)   Social Connection and Isolation Panel [NHANES]    Frequency of Communication with Friends and Family: More than three times a week    Frequency of Social Gatherings with Friends and Family: Three times a week    Attends Religious Services: 1 to 4 times per year    Active Member of Clubs or Organizations: No    Attends Banker Meetings: Never    Marital Status: Never married    Tobacco Counseling Counseling given: Not Answered   Clinical Intake:  Pre-visit preparation completed: Yes  Pain : No/denies pain     Diabetes: No  How often do you need to have someone help you when you read instructions, pamphlets, or other written materials from your doctor or pharmacy?: 1 - Never  Diabetic?No   Interpreter Needed?: No  Information entered by :: Kandis Fantasia LPN   Activities of Daily Living    10/15/2022    1:43 PM  In your present state of health, do you have  any difficulty performing the following activities:  Hearing? 0  Vision? 0  Difficulty concentrating or making decisions? 0  Walking or climbing stairs? 0  Dressing or bathing? 0  Doing errands, shopping? 0  Preparing Food and eating ? N  Using the Toilet? N  In the past six months, have you accidently leaked urine? N  Do you have problems with loss of bowel control? N  Managing your Medications? N  Managing your Finances? N  Housekeeping or managing your Housekeeping? N    Patient Care Team: Marcine Matar, MD as PCP - General (Internal Medicine)  Indicate any recent Medical Services you may have received  from other than [facility] providers in the past year (date may be approximate).     Assessment:   This is a routine wellness examination for [place].  Hearing/Vision screen Hearing Screening - Comments:: Denies hearing difficulties   Vision Screening - Comments:: Wears rx glasses - up to date with routine eye exams with Nile Riggs     Dietary issues and exercise activities discussed: Current Exercise Habits: Home exercise routine, Type of exercise: walking, Time (Minutes): 30, Frequency (Times/Week): 5, Weekly Exercise (Minutes/Week): 150, Intensity: Mild   Goals Addressed             This Visit's Progress    DIET - INCREASE WATER INTAKE       Remain active and independent         Depression Screen    10/15/2022    1:42 PM 01/03/2022    9:38 AM 09/25/2021    3:55 PM 05/28/2021    2:40 PM 06/26/2020   10:36 AM 01/04/2020    3:02 PM 01/20/2019    3:07 PM  PHQ 2/9 Scores  PHQ - 2 Score 0 0 0 0 0 0 0  PHQ- 9 Score  0  0   0    Fall Risk    10/15/2022    1:41 PM 01/03/2022    9:37 AM 09/25/2021    3:55 PM 05/28/2021    2:36 PM 06/26/2020   10:34 AM  Fall Risk   Falls in the past year? 0 0 0 0 0  Number falls in past yr: 0 0 0  0  Injury with Fall? 0 0 0  0  Risk for fall due to : No Fall Risks No Fall Risks No Fall Risks    Follow up Falls prevention  discussed;Education provided;Falls evaluation completed Falls evaluation completed       FALL RISK PREVENTION PERTAINING TO THE HOME:  Any stairs in or around the home? No  If so, are there any without handrails? No  Home free of loose throw rugs in walkways, pet beds, electrical cords, etc? Yes  Adequate lighting in your home to reduce risk of falls? Yes   ASSISTIVE DEVICES UTILIZED TO PREVENT FALLS:  Life alert? No  Use of a cane, walker or w/c? No  Grab bars in the bathroom? Yes  Shower chair or bench in shower? No  Elevated toilet seat or a handicapped toilet? No   TIMED UP AND GO:  Was the test performed? No . Telephonic visit   Cognitive Function:    09/25/2021    3:56 PM 01/04/2020    3:03 PM  MMSE - Mini Mental State Exam  Orientation to time 5 5  Orientation to Place 5 5  Registration 3 3  Attention/ Calculation 5 0  Recall 3 3  Language- name 2 objects 2 2  Language- repeat 1 1  Language- follow 3 step command 3 3  Language- read & follow direction 1 1  Write a sentence 1 1  Copy design 1 1  Total score 30 25        10/15/2022    1:44 PM  6CIT Screen  What Year? 0 points  What month? 0 points  What time? 0 points  Count back from 20 0 points  Months in reverse 0 points  Repeat phrase 0 points  Total Score 0 points    Immunizations Immunization History  Administered Date(s) Administered   Hepatitis B, ADULT 01/23/2016, 02/26/2016   Influenza,inj,Quad PF,6+ Mos 02/25/2017,  04/20/2018, 05/04/2019, 02/19/2021   Pneumococcal Conjugate-13 05/04/2019   Pneumococcal Polysaccharide-23 02/19/2021   Tdap 12/06/2015, 12/20/2018    TDAP status: Up to date  Flu Vaccine status: Up to date  Pneumococcal vaccine status: Up to date  Covid-19 vaccine status: Information provided on how to obtain vaccines.   Qualifies for Shingles Vaccine? Yes   Zostavax completed No   Shingrix Completed?: No.    Education has been provided regarding the importance of this  vaccine. Patient has been advised to call insurance company to determine out of pocket expense if they have not yet received this vaccine. Advised may also receive vaccine at local pharmacy or Health Dept. Verbalized acceptance and understanding.  Screening Tests Health Maintenance  Topic Date Due   COVID-19 Vaccine (1) Never done   Zoster Vaccines- Shingrix (1 of 2) Never done   INFLUENZA VACCINE  01/09/2023   Medicare Annual Wellness (AWV)  10/15/2023   COLONOSCOPY (Pts 45-43yrs Insurance coverage will need to be confirmed)  05/09/2027   DTaP/Tdap/Td (3 - Td or Tdap) 12/19/2028   Pneumonia Vaccine 84+ Years old  Completed   Hepatitis C Screening  Completed   HPV VACCINES  Aged Out    Health Maintenance  Health Maintenance Due  Topic Date Due   COVID-19 Vaccine (1) Never done   Zoster Vaccines- Shingrix (1 of 2) Never done    Colorectal cancer screening: Type of screening: Colonoscopy. Completed 05/08/17. Repeat every 10 years  Lung Cancer Screening: (Low Dose CT Chest recommended if Age 29-80 years, 30 pack-year currently smoking OR have quit w/in 15years.) does not qualify.   Lung Cancer Screening Referral: n/a  Additional Screening:  Hepatitis C Screening: does qualify; Completed 12/20/18  Vision Screening: Recommended annual ophthalmology exams for early detection of glaucoma and other disorders of the eye. Is the patient up to date with their annual eye exam?  Yes  Who is the provider or what is the name of the office in which the patient attends annual eye exams? Dr Nile Riggs If pt is not established with a provider, would they like to be referred to a provider to establish care? No .   Dental Screening: Recommended annual dental exams for proper oral hygiene  Community Resource Referral / Chronic Care Management: CRR required this visit?  No   CCM required this visit?  No      Plan:     I have personally reviewed and noted the following in the patient's chart:    Medical and social history Use of alcohol, tobacco or illicit drugs  Current medications and supplements including opioid prescriptions. Patient is not currently taking opioid prescriptions. Functional ability and status Nutritional status Physical activity Advanced directives List of other physicians Hospitalizations, surgeries, and ER visits in previous 12 months Vitals Screenings to include cognitive, depression, and falls Referrals and appointments  In addition, I have reviewed and discussed with patient certain preventive protocols, quality metrics, and best practice recommendations. A written personalized care plan for preventive services as well as general preventive health recommendations were provided to patient.     Durwin Nora, [place]   06/15/1094   Due to this being a virtual visit, the after visit summary with patients personalized plan was offered to patient via mail or my-chart.  per request, patient was mailed a copy of AVS.  Nurse Notes: No concerns

## 2022-10-31 ENCOUNTER — Ambulatory Visit: Payer: 59 | Admitting: Internal Medicine

## 2022-11-07 ENCOUNTER — Telehealth: Payer: Self-pay

## 2022-11-07 DIAGNOSIS — Z0100 Encounter for examination of eyes and vision without abnormal findings: Secondary | ICD-10-CM

## 2022-11-07 NOTE — Addendum Note (Signed)
Addended by: Jonah Blue B on: 11/07/2022 09:18 PM   Modules accepted: Orders

## 2022-11-07 NOTE — Telephone Encounter (Signed)
Copied from CRM [id number]. Topic: Referral - Question >> Nov 07, 2022 10:57 AM Haroldine Laws wrote: Reason for CRM: pt would like to be referred to an eye doctor  CB#  [phone]

## 2022-11-11 NOTE — Telephone Encounter (Signed)
Called & spoke to the patient. Verified name & DOB. Informed that referral has been submitted to an ophthalmologist. Patient expressed verbal understanding. No further questions at this time.

## 2022-12-17 ENCOUNTER — Ambulatory Visit: Payer: 59 | Admitting: Internal Medicine

## 2023-01-27 ENCOUNTER — Ambulatory Visit: Payer: 59 | Attending: Internal Medicine | Admitting: Internal Medicine

## 2023-02-20 ENCOUNTER — Encounter: Payer: Self-pay | Admitting: Internal Medicine

## 2023-02-20 ENCOUNTER — Ambulatory Visit: Payer: 59 | Attending: Internal Medicine | Admitting: Internal Medicine

## 2023-02-20 VITALS — BP 131/79 | HR 81 | Temp 98.2°F | Ht 70.0 in | Wt 197.0 lb

## 2023-02-20 DIAGNOSIS — I8510 Secondary esophageal varices without bleeding: Secondary | ICD-10-CM

## 2023-02-20 DIAGNOSIS — H43392 Other vitreous opacities, left eye: Secondary | ICD-10-CM | POA: Diagnosis not present

## 2023-02-20 DIAGNOSIS — R7303 Prediabetes: Secondary | ICD-10-CM | POA: Diagnosis not present

## 2023-02-20 DIAGNOSIS — D696 Thrombocytopenia, unspecified: Secondary | ICD-10-CM

## 2023-02-20 DIAGNOSIS — I10 Essential (primary) hypertension: Secondary | ICD-10-CM

## 2023-02-20 DIAGNOSIS — Z23 Encounter for immunization: Secondary | ICD-10-CM

## 2023-02-20 DIAGNOSIS — K7460 Unspecified cirrhosis of liver: Secondary | ICD-10-CM | POA: Diagnosis not present

## 2023-02-20 DIAGNOSIS — F1090 Alcohol use, unspecified, uncomplicated: Secondary | ICD-10-CM | POA: Diagnosis not present

## 2023-02-20 MED ORDER — NADOLOL 20 MG PO TABS: 10.0000 mg | ORAL_TABLET | Freq: Every day | ORAL | 3 refills | Status: DC

## 2023-02-20 NOTE — Progress Notes (Signed)
Patient ID: Luis Morrison, male    DOB: Jul 22, 1955  MRN: [id number]  CC: Follow-up (Follow-up. /Requesting referral to ophthalmology close to pt home/Flu vax administered today - C.A. No to shingles vax.)   Subjective: Luis Morrison is a 67 y.o. male who presents for chronic ds management. His concerns today include:  Hx Hep C status post successful treatment with Harvoni, cirrhosis, GERD, Vit D def, ETOH abuse, esophageal varices/bleeding 11/2021, preDM.   Request referral to ophthalmologist.  Seeing floaters in LT eye x few mths. Little blurred vision, no pain.  Cirrhosis: no blood in stools.  No abdominal pain/N/V, no increase abdominal girth or LE edema.  He has history of thrombocytopenia due to cirrhosis. -Supposed to be on nadolol for blood pressure and for history of varices.  He has been out of it for a while. -hx of esophageal variceal bleed last year. Cancelled appt with GI for repeat EGD.  Does not want it done -agrees for [place] for [facility] screening - binge drinking on wkends.  "I'm not going to stop; I know that."  Does not wish to quit. He has history of prediabetes.  At this time he denies any frequent thirst or frequent urination.   Patient Active Problem List   Diagnosis Date Noted   Secondary esophageal varices with bleeding (HCC)    Portal hypertensive gastropathy (HCC)    UGIB (upper gastrointestinal bleed) 11/28/2021   Multiple fractures of ribs, right side, init for clos fx 12/20/2018   Thrombocytopenia (HCC) 12/20/2018   Pneumothorax on right 12/20/2018   Compensated HCV cirrhosis (HCC) 12/20/2018   Cortical age-related cataract of both eyes 10/09/2017   Secondary esophageal varices without bleeding (HCC) 05/30/2017   Prediabetes 02/26/2017   Alcohol abuse 08/06/2016   Vitamin D deficiency 12/06/2015   Cirrhosis of liver without ascites (HCC) 10/09/2014   Hepatitis C virus infection cured after antiviral drug therapy 10/09/2014     No current outpatient  medications on file prior to visit.   No current facility-administered medications on file prior to visit.    No Known Allergies  Social History   Socioeconomic History   Marital status: Single    Spouse name: Not on file   Number of children: Not on file   Years of education: Not on file   Highest education level: Not on file  Occupational History   Not on file  Tobacco Use   Smoking status: Never   Smokeless tobacco: Never  Substance and Sexual Activity   Alcohol use: Yes    Alcohol/week: 2.0 standard drinks of alcohol    Types: 2 Cans of beer per week   Drug use: No   Sexual activity: Not on file  Other Topics Concern   Not on file  Social History Narrative   Not on file   Social Determinants of Health   Financial Resource Strain: Low Risk  (10/15/2022)   Overall Financial Resource Strain (CARDIA)    Difficulty of Paying Living Expenses: Not hard at all  Food Insecurity: No Food Insecurity (10/15/2022)   Hunger Vital Sign    Worried About Running Out of Food in the Last Year: Never true    Ran Out of Food in the Last Year: Never true  Transportation Needs: No Transportation Needs (10/15/2022)   PRAPARE - Administrator, Civil Service (Medical): No    Lack of Transportation (Non-Medical): No  Physical Activity: Sufficiently Active (10/15/2022)   Exercise Vital Sign  Days of Exercise per Week: 5 days    Minutes of Exercise per Session: 30 min  Stress: No Stress Concern Present (10/15/2022)   [employer] of Occupational Health - Occupational Stress Questionnaire    Feeling of Stress : Not at all  Social Connections: Moderately Isolated (10/15/2022)   Social Connection and Isolation Panel [NHANES]    Frequency of Communication with Friends and Family: More than three times a week    Frequency of Social Gatherings with Friends and Family: Three times a week    Attends Religious Services: 1 to 4 times per year    Active Member of Clubs or Organizations: No     Attends Banker Meetings: Never    Marital Status: Never married  Intimate Partner Violence: Not At Risk (10/15/2022)   Humiliation, Afraid, Rape, and Kick questionnaire    Fear of Current or Ex-Partner: No    Emotionally Abused: No    Physically Abused: No    Sexually Abused: No    Family History  Problem Relation Age of Onset   Cancer Mother    Brain cancer Mother    Colon cancer Neg Hx    Colon polyps Neg Hx    Esophageal cancer Neg Hx    Rectal cancer Neg Hx    Stomach cancer Neg Hx     Past Surgical History:  Procedure Laterality Date   ESOPHAGEAL BANDING  11/29/2021   Procedure: ESOPHAGEAL BANDING;  Surgeon: Imogene Burn, MD;  Location: [facility] ENDOSCOPY;  Service: Gastroenterology;;   ESOPHAGOGASTRODUODENOSCOPY (EGD) WITH PROPOFOL N/A 11/29/2021   Procedure: ESOPHAGOGASTRODUODENOSCOPY (EGD) WITH PROPOFOL;  Surgeon: Imogene Burn, MD;  Location: [facility] ENDOSCOPY;  Service: Gastroenterology;  Laterality: N/A;    ROS: Review of Systems Negative except as stated above  PHYSICAL EXAM: BP 131/79 (BP Location: Left Arm, Patient Position: Sitting, Cuff Size: Normal)   Pulse 81   Temp 98.2 F (36.8 C) (Oral)   Ht 5\' 10"  (1.778 m)   Wt 197 lb (89.4 kg)   SpO2 98%   BMI 28.27 kg/m   Wt Readings from Last 3 Encounters:  02/20/23 197 lb (89.4 kg)  10/15/22 199 lb (90.3 kg)  01/03/22 199 lb 9.6 oz (90.5 kg)    Physical Exam   General appearance - alert, well appearing, and in no distress Mental status - normal mood, behavior, speech, dress, motor activity, and thought processes Neck - supple, no significant adenopathy Chest - clear to auscultation, no wheezes, rales or rhonchi, symmetric air entry Heart - normal rate, regular rhythm, normal S1, S2, no murmurs, rubs, clicks or gallops Extremities - peripheral pulses normal, no pedal edema, no clubbing or cyanosis     Latest Ref Rng & Units 12/01/2021    2:36 AM 11/30/2021    1:20 AM 11/29/2021    3:57 AM   CMP  Glucose 70 - 99 mg/dL 811  914  782   BUN 8 - 23 mg/dL 6  10  19    Creatinine 0.61 - 1.24 mg/dL 9.56  2.13  0.86   Sodium 135 - 145 mmol/L 138  137  139   Potassium 3.5 - 5.1 mmol/L 3.9  4.0  3.9   Chloride 98 - 111 mmol/L 105  106  109   CO2 22 - 32 mmol/L 26  25  23    Calcium 8.9 - 10.3 mg/dL 8.2  8.3  8.2   Total Protein 6.5 - 8.1 g/dL  6.8  6.7   Total  Bilirubin 0.3 - 1.2 mg/dL  1.0  1.0   Alkaline Phos 38 - 126 U/L  83  84   AST 15 - 41 U/L  39  32   ALT 0 - 44 U/L  26  25    Lipid Panel     Component Value Date/Time   CHOL 174 02/19/2021 1404   TRIG 88 02/19/2021 1404   HDL 71 02/19/2021 1404   CHOLHDL 2.5 02/19/2021 1404   LDLCALC 87 02/19/2021 1404    CBC    Component Value Date/Time   WBC 4.3 01/03/2022 1029   WBC 5.4 12/02/2021 0422   RBC 4.39 01/03/2022 1029   RBC 3.60 (L) 12/02/2021 0422   HGB 11.9 (L) 01/03/2022 1029   HCT 36.8 (L) 01/03/2022 1029   PLT 80 (LL) 01/03/2022 1029   MCV 84 01/03/2022 1029   MCH 27.1 01/03/2022 1029   MCH 28.9 12/02/2021 0422   MCHC 32.3 01/03/2022 1029   MCHC 33.2 12/02/2021 0422   RDW 14.1 01/03/2022 1029   LYMPHSABS 1.0 01/20/2019 1529   MONOABS 357 07/30/2016 1126   EOSABS 0.0 01/20/2019 1529   BASOSABS 0.0 01/20/2019 1529    ASSESSMENT AND PLAN: 1. Essential hypertension Close to goal.  Refill nadolol.  Encouraged him to take the medication not just for blood pressure but also because of history of esophageal varices. - nadolol (CORGARD) 20 MG tablet; Take 0.5 tablets (10 mg total) by mouth daily.  Dispense: 90 tablet; Refill: 3  2. Esophageal varices in cirrhosis (HCC) He has not had any recurrent bleed.  He is not interested in going back to GI for repeat endoscopy.  He is agreeable to liver ultrasound for HCC screening. - US Abdomen Limited RUQ (LIVER/GB); Future - Comprehensive metabolic panel  3. Alcohol use disorder Strongly advised to quit.  Patient is not interested in quitting or going through any  treatment programs at this time.  4. Thrombocytopenia, unspecified (HCC) Due to cirrhosis.  Recheck CBC today. - CBC  5. Vitreous floaters of left eye - Ambulatory referral to Ophthalmology  6. Prediabetes Discussed and encourage healthy eating habits. - Hemoglobin A1c  7. Encounter for immunization - Flu Vaccine Trivalent High Dose (Fluad)  8. Need for shingles vaccine Pt declines    Patient was given the opportunity to ask questions.  Patient verbalized understanding of the plan and was able to repeat key elements of the plan.   This documentation was completed using Paediatric nurse.  Any transcriptional errors are unintentional.  Orders Placed This Encounter  Procedures   US Abdomen Limited RUQ (LIVER/GB)   Flu Vaccine Trivalent High Dose (Fluad)   CBC   Comprehensive metabolic panel   Hemoglobin A1c   Ambulatory referral to Ophthalmology     Requested Prescriptions   Signed Prescriptions Disp Refills   nadolol (CORGARD) 20 MG tablet 90 tablet 3    Sig: Take 0.5 tablets (10 mg total) by mouth daily.    Return in about 5 months (around 07/23/2023).  Jonah Blue, MD, FACP

## 2023-02-21 LAB — CBC
Hematocrit: 43.4 % (ref 37.5–51.0)
Hemoglobin: 13.7 g/dL (ref 13.0–17.7)
MCH: 27.3 pg (ref 26.6–33.0)
MCHC: 31.6 g/dL (ref 31.5–35.7)
MCV: 87 fL (ref 79–97)
Platelets: 54 10*3/uL — CL (ref 150–450)
RBC: 5.01 x10E6/uL (ref 4.14–5.80)
RDW: 15.1 % (ref 11.6–15.4)
WBC: 3.6 10*3/uL (ref 3.4–10.8)

## 2023-02-21 LAB — COMPREHENSIVE METABOLIC PANEL
ALT: 28 IU/L (ref 0–44)
AST: 46 IU/L — ABNORMAL HIGH (ref 0–40)
Albumin: 3.8 g/dL — ABNORMAL LOW (ref 3.9–4.9)
Alkaline Phosphatase: 156 IU/L — ABNORMAL HIGH (ref 44–121)
BUN/Creatinine Ratio: 8 — ABNORMAL LOW (ref 10–24)
BUN: 8 mg/dL (ref 8–27)
Bilirubin Total: 0.6 mg/dL (ref 0.0–1.2)
CO2: 23 mmol/L (ref 20–29)
Calcium: 8.6 mg/dL (ref 8.6–10.2)
Chloride: 105 mmol/L (ref 96–106)
Creatinine, Ser: 0.96 mg/dL (ref 0.76–1.27)
Globulin, Total: 3.9 g/dL (ref 1.5–4.5)
Glucose: 89 mg/dL (ref 70–99)
Potassium: 4.4 mmol/L (ref 3.5–5.2)
Sodium: 139 mmol/L (ref 134–144)
Total Protein: 7.7 g/dL (ref 6.0–8.5)
eGFR: 86 mL/min/{1.73_m2} (ref >59)

## 2023-02-21 LAB — HEMOGLOBIN A1C
Est. average glucose Bld gHb Est-mCnc: 117 mg/dL
Hgb A1c MFr Bld: 5.7 % — ABNORMAL HIGH (ref 4.8–5.6)

## 2023-03-03 ENCOUNTER — Other Ambulatory Visit: Payer: 59

## 2023-03-05 ENCOUNTER — Telehealth: Payer: Self-pay | Admitting: Internal Medicine

## 2023-03-05 NOTE — Telephone Encounter (Signed)
The patient called in stating he is need a prescription for pain meds as his pain comes and goes from his sorosis of the liver. He is also requesting Viagra and he would like this to be sent into his pharmacy   [facility] - [place] ([place]), [place] - [address] Phone: [phone]  Fax: [phone]      Please assist patient further

## 2023-03-05 NOTE — Telephone Encounter (Signed)
Routing to PCP for refill.

## 2023-03-06 ENCOUNTER — Other Ambulatory Visit: Payer: Self-pay | Admitting: Internal Medicine

## 2023-03-06 ENCOUNTER — Ambulatory Visit: Payer: Self-pay

## 2023-03-06 DIAGNOSIS — N529 Male erectile dysfunction, unspecified: Secondary | ICD-10-CM

## 2023-03-06 MED ORDER — TIZANIDINE HCL 4 MG PO TABS: 4.0000 mg | ORAL_TABLET | Freq: Two times a day (BID) | ORAL | 0 refills | Status: DC | PRN

## 2023-03-06 NOTE — Telephone Encounter (Signed)
Medication Refill - Medication: sildenafil (VIAGRA) 50 MG tablet    Has the patient contacted their pharmacy? No. (Agent: If no, request that the patient contact the pharmacy for the refill. If patient does not wish to contact the pharmacy document the reason why and proceed with request.)   Preferred Pharmacy (with phone number or street name):  [facility] - [place] ([place]), [place] - [address]  [address] [place] ([place]) [place] [address]  Phone: [phone] Fax: [phone]  Hours: Not open 24 hours   Has the patient been seen for an appointment in the last year OR does the patient have an upcoming appointment? Yes.    Agent: Please be advised that RX refills may take up to 3 business days. We ask that you follow-up with your pharmacy.

## 2023-03-06 NOTE — Telephone Encounter (Signed)
I have sent a Prescription for Tizanidine to his Pharmacy. He will need to discuss Viagra with PCP when she is back in the office next week.

## 2023-03-06 NOTE — Telephone Encounter (Signed)
Requested Prescriptions  Pending Prescriptions Disp Refills   sildenafil (VIAGRA) 50 MG tablet 10 tablet 3    Sig: TAKE 1 TABLET BY MOUTH AS NEEDED HALF HOUR PRIOR TO INTERCOURSE LIMIT USE TO 1 TAB/24 HOURS     Urology: Erectile Dysfunction Agents Failed - 03/06/2023 11:35 AM      Failed - AST in normal range and within 360 days    AST  Date Value Ref Range Status  02/20/2023 46 (H) 0 - 40 IU/L Final         Passed - ALT in normal range and within 360 days    ALT  Date Value Ref Range Status  02/20/2023 28 0 - 44 IU/L Final         Passed - Last BP in normal range    BP Readings from Last 1 Encounters:  02/20/23 131/79         Passed - Valid encounter within last 12 months    Recent Outpatient Visits           2 weeks ago Essential hypertension   [place] [facility] Marcine Matar, MD   1 year ago Hospital discharge follow-up   [facility] Marcine Matar, MD   1 year ago Acute cough   [facility] Marcine Matar, MD   2 years ago Essential hypertension   [place] [facility] Marcine Matar, MD   2 years ago Acute upper respiratory infection   [facility] Marcine Matar, MD       Future Appointments             In 4 months Laural Benes, Binnie Rail, MD [facility]

## 2023-03-06 NOTE — Telephone Encounter (Signed)
Summary: med management   Pt stated he needs stronger pain medication for his sorosis of the liver as his pain comes and goes.  Seeking clinical advice.         Chief Complaint: Right lower abdominal pain. Comes and goes. Worse at night. Asking for pain medication and refill on Viagra. Symptoms: Above Frequency: "Has been going on." Pertinent Negatives: Patient denies  Disposition: [] ED /[] Urgent Care (no appt availability in office) / [] Appointment(In office/virtual)/ []  [place] [facility]/ [] Home Care/ [x] Refused Recommended Disposition /[] [place] [facility]/ [x]  Follow-up with PCP Additional Notes: Please advise pt.  Reason for Disposition  [1] MODERATE pain (e.g., interferes with normal activities) AND [2] pain comes and goes (cramps) AND [3] present > 24 hours  (Exception: Pain with Vomiting or Diarrhea - see that Guideline.)  Answer Assessment - Initial Assessment Questions 1. LOCATION: "Where does it hurt?"      Lower right side 2. RADIATION: "Does the pain shoot anywhere else?" (e.g., chest, back)     No 3. ONSET: "When did the pain begin?" (Minutes, hours or days ago)      2 Weeks ago 4. SUDDEN: "Gradual or sudden onset?"     Gradual 5. PATTERN "Does the pain come and go, or is it constant?"    - If it comes and goes: "How long does it last?" "Do you have pain now?"     (Note: Comes and goes means the pain is intermittent. It goes away completely between bouts.)    - If constant: "Is it getting better, staying the same, or getting worse?"      (Note: Constant means the pain never goes away completely; most serious pain is constant and gets worse.)      Comes and goes 6. SEVERITY: "How bad is the pain?"  (e.g., Scale 1-10; mild, moderate, or severe)    - MILD (1-3): Doesn't interfere with normal activities, abdomen soft and not tender to touch.     - MODERATE (4-7): Interferes with normal activities or awakens from sleep, abdomen tender to touch.     - SEVERE  (8-10): Excruciating pain, doubled over, unable to do any normal activities.       Moderate 7. RECURRENT SYMPTOM: "Have you ever had this type of stomach pain before?" If Yes, ask: "When was the last time?" and "What happened that time?"      Yes 8. CAUSE: "What do you think is causing the stomach pain?"     Cirrhosis  9. RELIEVING/AGGRAVATING FACTORS: "What makes it better or worse?" (e.g., antacids, bending or twisting motion, bowel movement)     No 10. OTHER SYMPTOMS: "Do you have any other symptoms?" (e.g., back pain, diarrhea, fever, urination pain, vomiting)       No  Protocols used: Abdominal Pain - Male-A-AH

## 2023-03-07 ENCOUNTER — Other Ambulatory Visit: Payer: Self-pay | Admitting: Internal Medicine

## 2023-03-07 ENCOUNTER — Telehealth: Payer: Self-pay | Admitting: Internal Medicine

## 2023-03-07 DIAGNOSIS — N529 Male erectile dysfunction, unspecified: Secondary | ICD-10-CM

## 2023-03-07 NOTE — Telephone Encounter (Signed)
Duplicate message, provider has sent medication for patient.

## 2023-03-07 NOTE — Telephone Encounter (Unsigned)
Copied from CRM [id number]. Topic: General - Other >> Mar 07, 2023  2:33 PM Everette C wrote: Reason for CRM: Medication Refill - Medication: sildenafil (VIAGRA) 50 MG tablet [[id number]]   Has the patient contacted their pharmacy? Yes.   (Agent: If no, request that the patient contact the pharmacy for the refill. If patient does not wish to contact the pharmacy document the reason why and proceed with request.) (Agent: If yes, when and what did the pharmacy advise?)  Preferred Pharmacy (with phone number or street name): [facility] - [place] ([place]), [place] - [address] [place] ([place]) [place] [address] Phone: [phone] Fax: [phone] Hours: Not open 24 hours   Has the patient been seen for an appointment in the last year OR does the patient have an upcoming appointment? Yes.    Agent: Please be advised that RX refills may take up to 3 business days. We ask that you follow-up with your pharmacy.

## 2023-03-07 NOTE — Telephone Encounter (Signed)
Refused the Viagra 50 mg because it was discontinued 02/20/2023.

## 2023-03-07 NOTE — Telephone Encounter (Signed)
Pt informed of medication being sent to pharmacy. 

## 2023-03-10 MED ORDER — SILDENAFIL CITRATE 50 MG PO TABS: ORAL_TABLET | ORAL | 1 refills | Status: DC

## 2023-03-10 NOTE — Telephone Encounter (Signed)
Sent to [facility] - [place] ([place]), [place] - [address] [place] ([place]) [place] [address]

## 2023-03-17 ENCOUNTER — Other Ambulatory Visit: Payer: 59

## 2023-03-27 DIAGNOSIS — H04123 Dry eye syndrome of bilateral lacrimal glands: Secondary | ICD-10-CM | POA: Diagnosis not present

## 2023-03-27 DIAGNOSIS — H401133 Primary open-angle glaucoma, bilateral, severe stage: Secondary | ICD-10-CM | POA: Diagnosis not present

## 2023-03-27 DIAGNOSIS — H25813 Combined forms of age-related cataract, bilateral: Secondary | ICD-10-CM | POA: Diagnosis not present

## 2023-03-31 ENCOUNTER — Other Ambulatory Visit (HOSPITAL_COMMUNITY): Payer: Self-pay | Admitting: Ophthalmology

## 2023-03-31 DIAGNOSIS — H5347 Heteronymous bilateral field defects: Secondary | ICD-10-CM

## 2023-04-02 ENCOUNTER — Ambulatory Visit: Payer: Self-pay | Admitting: *Deleted

## 2023-04-02 NOTE — Telephone Encounter (Signed)
  Chief Complaint: Needing refill of his pain medication for the cirrhosis of the liver.  Also a 90 day supply  of the Viagra. Symptoms: was given muscle relaxers by another provider which are not helping his pain.    Frequency: Intermittently  Pertinent Negatives: Patient denies the muscle relaxers helping and wonders why they were even prescribed for cirrhosis pain. Disposition: [] ED /[] Urgent Care (no appt availability in office) / [] Appointment(In office/virtual)/ []  [place]/ [] Home Care/ [] Refused Recommended Disposition /[] [place] [facility]/ [x]  Follow-up with PCP Additional Notes: Message sent to Dr. Laural Benes.

## 2023-04-02 NOTE — Telephone Encounter (Signed)
Message from [place] E sent at 04/02/2023  2:45 PM EDT  Summary: Needs pain medicine, symptomatic   Pt called reporting that he is experiencing weakness and pain, requesting some pain medication. Upset with his provider because he says she prescribed him muscle relaxers which did not help. Says they made him sick.  Best contact: [phone]          Call History  Contact Date/Time Type Contact Phone/Fax User  04/02/2023 02:43 PM EDT Phone (Incoming) [name] (Self) [phone] Judie Petit) Randol Kern   Reason for Disposition  Abdominal pain is a chronic symptom (recurrent or ongoing AND present > 4 weeks)    Pt has cirrhosis of the liver.   Called in for a refill of his pain medication for the cirrhosis.   He was given muscle relaxers by another provider for his pain last time he called in.   Those are not helping this.   They make me weak and tired.  Answer Assessment - Initial Assessment Questions 1. LOCATION: "Where does it hurt?"     I returned pt's call.   I was given muscle relaxers and they make me sick.   I need something for pain.   At night time my pain is 6/10 on pain scale.   I don't know why I was given muscle relaxers for pain.   (On 03/07/2023 given Zanaflex).  Dr. Laural Benes gives me those blue pills for pain that I cut in half and take for my cirrhosis of the liver.   I don't understand why I was prescribed muscle relaxers.   Those make me tired and weak.  I also would like a 90 day supply of the Viagra.   I don't want to call the pharmacy every month for my refill.  "I'm almost 67 years old!   "I need a little help".    No triage done.   The cirrhosis of the liver is not new.   He just needed a refill of pain medication instead of muscle relaxers.   2. RADIATION: "Does the pain shoot anywhere else?" (e.g., chest, back)     Not asked 3. ONSET: "When did the pain begin?" (Minutes, hours or days ago)      chronic 4. SUDDEN: "Gradual or sudden onset?"     Not  asked 5. PATTERN "Does the pain come and go, or is it constant?"    - If it comes and goes: "How long does it last?" "Do you have pain now?"     (Note: Comes and goes means the pain is intermittent. It goes away completely between bouts.)    - If constant: "Is it getting better, staying the same, or getting worse?"      (Note: Constant means the pain never goes away completely; most serious pain is constant and gets worse.)      Intermittent 6. SEVERITY: "How bad is the pain?"  (e.g., Scale 1-10; mild, moderate, or severe)    - MILD (1-3): Doesn't interfere with normal activities, abdomen soft and not tender to touch.     - MODERATE (4-7): Interferes with normal activities or awakens from sleep, abdomen tender to touch.     - SEVERE (8-10): Excruciating pain, doubled over, unable to do any normal activities.       Not asked 7. RECURRENT SYMPTOM: "Have you ever had this type of stomach pain before?" If Yes, ask: "When was the last time?" and "What happened that time?"  Yes   Cirrhosis of the liver 8. CAUSE: "What do you think is causing the stomach pain?"     Cirrhosis  9. RELIEVING/AGGRAVATING FACTORS: "What makes it better or worse?" (e.g., antacids, bending or twisting motion, bowel movement)     The pain medicine Dr. Laural Benes usually gives me. 10. OTHER SYMPTOMS: "Do you have any other symptoms?" (e.g., back pain, diarrhea, fever, urination pain, vomiting)       Not asked  Protocols used: Abdominal Pain - Male-A-AH

## 2023-04-07 ENCOUNTER — Ambulatory Visit (HOSPITAL_COMMUNITY): Payer: 59

## 2023-04-18 ENCOUNTER — Ambulatory Visit (HOSPITAL_COMMUNITY): Payer: 59

## 2023-04-18 ENCOUNTER — Encounter (HOSPITAL_COMMUNITY): Payer: Self-pay

## 2023-04-21 ENCOUNTER — Ambulatory Visit: Payer: 59 | Attending: Internal Medicine | Admitting: Internal Medicine

## 2023-04-21 ENCOUNTER — Encounter: Payer: Self-pay | Admitting: Internal Medicine

## 2023-04-21 VITALS — BP 144/82 | HR 70 | Temp 97.8°F | Ht 70.0 in | Wt 197.0 lb

## 2023-04-21 DIAGNOSIS — K4090 Unilateral inguinal hernia, without obstruction or gangrene, not specified as recurrent: Secondary | ICD-10-CM

## 2023-04-21 DIAGNOSIS — I10 Essential (primary) hypertension: Secondary | ICD-10-CM

## 2023-04-21 NOTE — Patient Instructions (Signed)
Inguinal Hernia, Adult An inguinal hernia is when fat or your intestines push through a weak spot in a muscle where your leg meets your lower belly (groin). This causes a bulge. This kind of hernia could also be: In your scrotum, if you are male. In folds of skin around your vagina, if you are male. There are three types of inguinal hernias: Hernias that can be pushed back into the belly (are reducible). This type rarely causes pain. Hernias that cannot be pushed back into the belly (are incarcerated). Hernias that cannot be pushed back into the belly and lose their blood supply (are strangulated). This type needs emergency surgery. What are the causes? This condition is caused by having a weak spot in the muscles or tissues in your groin. This develops over time. The hernia may poke through the weak spot when you strain your lower belly muscles all of a sudden, such as when you: Lift a heavy object. Strain to poop (have a bowel movement). Trouble pooping (constipation) can lead to straining. Cough. What increases the risk? This condition is more likely to develop in: Males. Pregnant females. People who: Are overweight. Work in jobs that require long periods of standing or heavy lifting. Have had an inguinal hernia before. Smoke or have lung disease. These factors can lead to long-term (chronic) coughing. What are the signs or symptoms? Symptoms may depend on the size of the hernia. Often, a small hernia has no symptoms. Symptoms of a larger hernia may include: A bulge in the groin area. This is easier to see when standing. You might not be able to see it when you are lying down. Pain or burning in the groin. This may get worse when you lift, strain, or cough. A dull ache or a feeling of pressure in the groin. An abnormal bulge in the scrotum, in males. Symptoms of a strangulated inguinal hernia may include: A bulge in your groin that is very painful and tender to the touch. A bulge  that turns red or purple. Fever, feeling like you may vomit (nausea), and vomiting. Not being able to poop or to pass gas. How is this treated? Treatment depends on the size of your hernia and whether you have symptoms. If you do not have symptoms, your doctor may have you watch your hernia carefully and have you come in for follow-up visits. If your hernia is large or if you have symptoms, you may need surgery to repair the hernia. Follow these instructions at home: Lifestyle Avoid lifting heavy objects. Avoid standing for long amounts of time. Do not smoke or use any products that contain nicotine or tobacco. If you need help quitting, ask your doctor. Stay at a healthy weight. Prevent trouble pooping You may need to take these actions to prevent or treat trouble pooping: Drink enough fluid to keep your pee (urine) pale yellow. Take over-the-counter or prescription medicines. Eat foods that are high in fiber. These include beans, whole grains, and fresh fruits and vegetables. Limit foods that are high in fat and sugar. These include fried or sweet foods. General instructions You may try to push your hernia back in place by very gently pressing on it when you are lying down. Do not try to push the bulge back in if it will not go in easily. Watch your hernia for any changes in shape, size, or color. Tell your doctor if you see any changes. Take over-the-counter and prescription medicines only as told by your doctor. Keep   all follow-up visits. Contact a doctor if: You have a fever or chills. You have new symptoms. Your symptoms get worse. Get help right away if: You have pain in your groin that gets worse all of a sudden. You have a bulge in your groin that: Gets bigger all of a sudden, and it does not get smaller after that. Turns red or purple. Is painful when you touch it. You are a male, and you have: Sudden pain in your scrotum. A sudden change in the size of your scrotum. You  cannot push the hernia back in place by very gently pressing on it when you are lying down. You feel like you may vomit, and that feeling does not go away. You keep vomiting. You have a fast heartbeat. You cannot poop or pass gas. These symptoms may be an emergency. Get help right away. Call your local emergency services (911 in the [place] Do not wait to see if the symptoms will go away. Do not drive yourself to the hospital. Summary An inguinal hernia is when fat or your intestines push through a weak spot in a muscle where your leg meets your lower belly (groin). This causes a bulge. If you do not have symptoms, you may not need treatment. If you have symptoms or a large hernia, you may need surgery. Avoid lifting heavy objects. Also, avoid standing for long amounts of time. Do not try to push the bulge back in if it will not go in easily. This information is not intended to replace advice given to you by your health care provider. Make sure you discuss any questions you have with your health care provider. Document Revised: 01/25/2020 Document Reviewed: 01/25/2020 Elsevier Patient Education  2024 Elsevier Inc.  

## 2023-04-21 NOTE — Progress Notes (Signed)
Patient ID: Luis Morrison, male    DOB: 1955-09-28  MRN: [id number]  CC: Mass (Mass on R pubic area x1 week, pain with pressure/Already received flu vax.)   Subjective: Luis Morrison is a 67 y.o. male who presents for UC visit. His concerns today include:  Hx HTN, Hep C status post successful treatment with Harvoni, cirrhosis, GERD, Vit D def, ETOH abuse, esophageal varices/bleeding 11/2021, preDM.   Pt c/o mass on R pubic area x1 week Size of gulf ball. Denies any recent strenuous activities like heavy lifting, pushing or pulling. Discomfort when he turns in bed and when he is in a stooped position about to sit down. Also sore if he applies pressure to the area. Can hardly tell it is there when laying down.  Most noticeable when standing  HTN:  reports compliance with Nadolol; did not take Nadolol as yet for the a.m. Admits he likes salty foods No device to check BP Patient Active Problem List   Diagnosis Date Noted   Secondary esophageal varices with bleeding (HCC)    Portal hypertensive gastropathy (HCC)    UGIB (upper gastrointestinal bleed) 11/28/2021   Multiple fractures of ribs, right side, init for clos fx 12/20/2018   Thrombocytopenia (HCC) 12/20/2018   Pneumothorax on right 12/20/2018   Compensated HCV cirrhosis (HCC) 12/20/2018   Cortical age-related cataract of both eyes 10/09/2017   Secondary esophageal varices without bleeding (HCC) 05/30/2017   Prediabetes 02/26/2017   Alcohol abuse 08/06/2016   Vitamin D deficiency 12/06/2015   Cirrhosis of liver without ascites (HCC) 10/09/2014   Hepatitis C virus infection cured after antiviral drug therapy 10/09/2014     Current Outpatient Medications on File Prior to Visit  Medication Sig Dispense Refill   nadolol (CORGARD) 20 MG tablet Take 0.5 tablets (10 mg total) by mouth daily. 90 tablet 3   sildenafil (VIAGRA) 50 MG tablet TAKE 1 TABLET BY MOUTH AS NEEDED HALF  HOUR  PRIOR  TO  INTERCOURSE.  LIMIT  USE  TO  1  TAB   EVERY  24  HOURS 10 tablet 1   tiZANidine (ZANAFLEX) 4 MG tablet Take 1 tablet (4 mg total) by mouth every 12 (twelve) hours as needed. 60 tablet 0   No current facility-administered medications on file prior to visit.    No Known Allergies  Social History   Socioeconomic History   Marital status: Single    Spouse name: Not on file   Number of children: Not on file   Years of education: Not on file   Highest education level: Not on file  Occupational History   Not on file  Tobacco Use   Smoking status: Never   Smokeless tobacco: Never  Substance and Sexual Activity   Alcohol use: Yes    Alcohol/week: 2.0 standard drinks of alcohol    Types: 2 Cans of beer per week   Drug use: No   Sexual activity: Not on file  Other Topics Concern   Not on file  Social History Narrative   Not on file   Social Determinants of Health   Financial Resource Strain: Low Risk  (10/15/2022)   Overall Financial Resource Strain (CARDIA)    Difficulty of Paying Living Expenses: Not hard at all  Food Insecurity: No Food Insecurity (10/15/2022)   Hunger Vital Sign    Worried About Running Out of Food in the Last Year: Never true    Ran Out of Food in the Last Year: Never  true  Transportation Needs: No Transportation Needs (10/15/2022)   PRAPARE - Administrator, Civil Service (Medical): No    Lack of Transportation (Non-Medical): No  Physical Activity: Sufficiently Active (10/15/2022)   Exercise Vital Sign    Days of Exercise per Week: 5 days    Minutes of Exercise per Session: 30 min  Stress: No Stress Concern Present (10/15/2022)   [employer] of Occupational Health - Occupational Stress Questionnaire    Feeling of Stress : Not at all  Social Connections: Moderately Isolated (10/15/2022)   Social Connection and Isolation Panel [NHANES]    Frequency of Communication with Friends and Family: More than three times a week    Frequency of Social Gatherings with Friends and Family: Three  times a week    Attends Religious Services: 1 to 4 times per year    Active Member of Clubs or Organizations: No    Attends Banker Meetings: Never    Marital Status: Never married  Intimate Partner Violence: Not At Risk (10/15/2022)   Humiliation, Afraid, Rape, and Kick questionnaire    Fear of Current or Ex-Partner: No    Emotionally Abused: No    Physically Abused: No    Sexually Abused: No    Family History  Problem Relation Age of Onset   Cancer Mother    Brain cancer Mother    Colon cancer Neg Hx    Colon polyps Neg Hx    Esophageal cancer Neg Hx    Rectal cancer Neg Hx    Stomach cancer Neg Hx     Past Surgical History:  Procedure Laterality Date   ESOPHAGEAL BANDING  11/29/2021   Procedure: ESOPHAGEAL BANDING;  Surgeon: Imogene Burn, MD;  Location: [facility] ENDOSCOPY;  Service: Gastroenterology;;   ESOPHAGOGASTRODUODENOSCOPY (EGD) WITH PROPOFOL N/A 11/29/2021   Procedure: ESOPHAGOGASTRODUODENOSCOPY (EGD) WITH PROPOFOL;  Surgeon: Imogene Burn, MD;  Location: [facility] ENDOSCOPY;  Service: Gastroenterology;  Laterality: N/A;    ROS: Review of Systems Negative except as stated above  PHYSICAL EXAM: BP (!) 144/82   Pulse 70   Temp 97.8 F (36.6 C) (Oral)   Ht 5\' 10"  (1.778 m)   Wt 197 lb (89.4 kg)   SpO2 98%   BMI 28.27 kg/m   Physical Exam   General appearance - alert, well appearing, and in no distress Mental status - normal mood, behavior, speech, dress, motor activity, and thought processes GU Male -CMA Carly Cherre Huger present as chaperone: Patient has golf ball size hernia in the right inguinal area.  Does not descend into the scrotal sac     Latest Ref Rng & Units 02/20/2023    3:03 PM 12/01/2021    2:36 AM 11/30/2021    1:20 AM  CMP  Glucose 70 - 99 mg/dL 89  440  102   BUN 8 - 27 mg/dL 8  6  10    Creatinine 0.76 - 1.27 mg/dL 7.25  3.66  4.40   Sodium 134 - 144 mmol/L 139  138  137   Potassium 3.5 - 5.2 mmol/L 4.4  3.9  4.0   Chloride 96 - 106 mmol/L  105  105  106   CO2 20 - 29 mmol/L 23  26  25    Calcium 8.6 - 10.2 mg/dL 8.6  8.2  8.3   Total Protein 6.0 - 8.5 g/dL 7.7   6.8   Total Bilirubin 0.0 - 1.2 mg/dL 0.6   1.0   Alkaline Phos 44 -  121 IU/L 156   83   AST 0 - 40 IU/L 46   39   ALT 0 - 44 IU/L 28   26    Lipid Panel     Component Value Date/Time   CHOL 174 02/19/2021 1404   TRIG 88 02/19/2021 1404   HDL 71 02/19/2021 1404   CHOLHDL 2.5 02/19/2021 1404   LDLCALC 87 02/19/2021 1404    CBC    Component Value Date/Time   WBC 3.6 02/20/2023 1503   WBC 5.4 12/02/2021 0422   RBC 5.01 02/20/2023 1503   RBC 3.60 (L) 12/02/2021 0422   HGB 13.7 02/20/2023 1503   HCT 43.4 02/20/2023 1503   PLT 54 (LL) 02/20/2023 1503   MCV 87 02/20/2023 1503   MCH 27.3 02/20/2023 1503   MCH 28.9 12/02/2021 0422   MCHC 31.6 02/20/2023 1503   MCHC 33.2 12/02/2021 0422   RDW 15.1 02/20/2023 1503   LYMPHSABS 1.0 01/20/2019 1529   MONOABS 357 07/30/2016 1126   EOSABS 0.0 01/20/2019 1529   BASOSABS 0.0 01/20/2019 1529    ASSESSMENT AND PLAN:  1. Non-recurrent unilateral inguinal hernia without obstruction or gangrene Discussed diagnosis with patient.  Went over signs and symptoms that would be suggestive of strangulated hernia and advised to be seen in the emergency room.  Will refer to general surgeon.  Advised to avoid any heavy lifting, pushing or pulling.  Printed information provided. - Ambulatory referral to General Surgery  2. Essential hypertension Not at goal.  He has not taken his nadolol as yet for the morning.  Advised that he continues to take the nadolol 10 mg daily.   DASH diet discussed and encouraged.  Follow-up with clinical pharmacist in 1 month for recheck.   Patient was given the opportunity to ask questions.  Patient verbalized understanding of the plan and was able to repeat key elements of the plan.   This documentation was completed using Paediatric nurse.  Any transcriptional errors are  unintentional.  Orders Placed This Encounter  Procedures   Ambulatory referral to General Surgery     Requested Prescriptions    No prescriptions requested or ordered in this encounter    Return for Appt with [facility] in 4 wks for BP check.  Jonah Blue, MD, FACP

## 2023-04-22 ENCOUNTER — Other Ambulatory Visit: Payer: Self-pay

## 2023-04-22 ENCOUNTER — Emergency Department (HOSPITAL_COMMUNITY)
Admission: EM | Admit: 2023-04-22 | Discharge: 2023-04-22 | Disposition: A | Discharge status: Home / Self Care | Payer: 59 | Attending: Emergency Medicine | Admitting: Emergency Medicine

## 2023-04-22 ENCOUNTER — Ambulatory Visit: Payer: Self-pay

## 2023-04-22 ENCOUNTER — Encounter (HOSPITAL_COMMUNITY): Payer: Self-pay

## 2023-04-22 DIAGNOSIS — R1030 Lower abdominal pain, unspecified: Secondary | ICD-10-CM | POA: Diagnosis present

## 2023-04-22 DIAGNOSIS — K4090 Unilateral inguinal hernia, without obstruction or gangrene, not specified as recurrent: Secondary | ICD-10-CM | POA: Diagnosis not present

## 2023-04-22 LAB — CBC
HCT: 43.1 % (ref 39.0–52.0)
Hemoglobin: 14 g/dL (ref 13.0–17.0)
MCH: 27.9 pg (ref 26.0–34.0)
MCHC: 32.5 g/dL (ref 30.0–36.0)
MCV: 85.9 fL (ref 80.0–100.0)
Platelets: 66 10*3/uL — ABNORMAL LOW (ref 150–400)
RBC: 5.02 MIL/uL (ref 4.22–5.81)
RDW: 15.7 % — ABNORMAL HIGH (ref 11.5–15.5)
WBC: 3.9 10*3/uL — ABNORMAL LOW (ref 4.0–10.5)
nRBC: 0 % (ref 0.0–0.2)

## 2023-04-22 LAB — COMPREHENSIVE METABOLIC PANEL
ALT: 29 U/L (ref 0–44)
AST: 49 U/L — ABNORMAL HIGH (ref 15–41)
Albumin: 2.9 g/dL — ABNORMAL LOW (ref 3.5–5.0)
Alkaline Phosphatase: 165 U/L — ABNORMAL HIGH (ref 38–126)
Anion gap: 9 (ref 5–15)
BUN: 5 mg/dL — ABNORMAL LOW (ref 8–23)
CO2: 21 mmol/L — ABNORMAL LOW (ref 22–32)
Calcium: 8.5 mg/dL — ABNORMAL LOW (ref 8.9–10.3)
Chloride: 106 mmol/L (ref 98–111)
Creatinine, Ser: 0.79 mg/dL (ref 0.61–1.24)
GFR, Estimated: >60 mL/min (ref >60)
Glucose, Bld: 103 mg/dL — ABNORMAL HIGH (ref 70–99)
Potassium: 3.9 mmol/L (ref 3.5–5.1)
Sodium: 136 mmol/L (ref 135–145)
Total Bilirubin: 0.9 mg/dL (ref <1.2)
Total Protein: 7.7 g/dL (ref 6.5–8.1)

## 2023-04-22 LAB — URINALYSIS, ROUTINE W REFLEX MICROSCOPIC
Bilirubin Urine: NEGATIVE
Glucose, UA: NEGATIVE mg/dL
Hgb urine dipstick: NEGATIVE
Ketones, ur: NEGATIVE mg/dL
Leukocytes,Ua: NEGATIVE
Nitrite: NEGATIVE
Protein, ur: NEGATIVE mg/dL
Specific Gravity, Urine: 1.009 (ref 1.005–1.030)
pH: 7 (ref 5.0–8.0)

## 2023-04-22 LAB — LIPASE, BLOOD: Lipase: 25 U/L (ref 11–51)

## 2023-04-22 NOTE — Telephone Encounter (Signed)
Message from Phill Myron sent at 04/22/2023 11:53 AM EST  Summary: pubic pain   Pubic pain,at a [age] now..... and patient is upset and would like an explanation as to why he is being  referred to General Surgery ([facility])         Chief Complaint: pubic pain is worse, concerned why referred to surgeon Symptoms: pubic pain  Disposition: [x] ED /[] Urgent Care (no appt availability in office) / [] Appointment(In office/virtual)/ []  [facility]/ [] Home Care/ [] Refused Recommended Disposition /[]  Mobile Bus/ []  Follow-up with PCP Additional Notes: pt pain is worse and explained the concern of constant pain could be his intestines have twisted and is strangling . After discussion pt agreeable to go to ED. Discussed OV note that he was advised to go then.  Reason for Disposition  [1] MILD-MODERATE pain AND [2] constant AND [3] present > 2 hours  Answer Assessment - Initial Assessment Questions 1. LOCATION: "Where does it hurt?"      Pubic area 2. PATTERN "Does the pain come and go, or is it constant?"    - If it comes and goes: "How long does it last?" "Do you have pain now?"     (Note: Comes and goes means the pain is intermittent. It goes away completely between bouts.)    - If constant: "Is it getting better, staying the same, or getting worse?"      (Note: Constant means the pain never goes away completely; most serious pain is constant and gets worse.)      constant 63 SEVERITY: "How bad is the pain?"  (e.g., Scale 1-10; mild, moderate, or severe)    - MILD (1-3): Doesn't interfere with normal activities, abdomen soft and not tender to touch.     - MODERATE (4-7): Interferes with normal activities or awakens from sleep, abdomen tender to touch.     - SEVERE (8-10): Excruciating pain, doubled over, unable to do any normal activities.       6/10  4. CAUSE: "What do you think is causing the stomach pain?"     hernia  Protocols used:  Abdominal Pain - Male-A-AH

## 2023-04-22 NOTE — ED Triage Notes (Signed)
Pt has right inguinal hernia that has been increasingly painful x 4 days. Pt states he is able to push the hernia back in but it does not stay. Pt states he has had to strain to have a bowel movement at times. Pt denies nausea, vomiting, diarrhea and constipation.

## 2023-04-22 NOTE — Telephone Encounter (Signed)
Will forward to nurse 

## 2023-04-22 NOTE — ED Provider Notes (Signed)
[place] EMERGENCY DEPARTMENT AT [facility] Provider Note   CSN: [id number] Arrival date & time: 04/22/23  1318     History  Chief Complaint  Patient presents with   Abdominal Pain    Luis Morrison is a 67 y.o. male.  With a history of cirrhosis of the liver, hepatitis C and GI bleeding who presents to the ED for groin pain.  He first noticed a right groin hernia 1 week ago after straining to have a bowel movement.  He was initially able to reduce the hernia on its own but was not able to today.  There is mild discomfort associated with this.  No changes in bowel habits, fevers, chills, nausea, vomiting or GI bleeding.  No prior history of hernias, hernia repair or other abdominal surgeries.   Abdominal Pain      Home Medications Prior to Admission medications   Medication Sig Start Date End Date Taking? Authorizing Provider  nadolol (CORGARD) 20 MG tablet Take 0.5 tablets (10 mg total) by mouth daily. 02/20/23   Marcine Matar, MD  sildenafil (VIAGRA) 50 MG tablet TAKE 1 TABLET BY MOUTH AS NEEDED HALF  HOUR  PRIOR  TO  INTERCOURSE.  LIMIT  USE  TO  1  TAB  EVERY  24  HOURS 03/10/23   Marcine Matar, MD  tiZANidine (ZANAFLEX) 4 MG tablet Take 1 tablet (4 mg total) by mouth every 12 (twelve) hours as needed. 03/06/23   Hoy Register, MD      Allergies    Patient has no known allergies.    Review of Systems   Review of Systems  Gastrointestinal:  Positive for abdominal pain.    Physical Exam Updated Vital Signs BP (!) 151/93 (BP Location: Right Arm)   Pulse 74   Temp 98.1 F (36.7 C) (Oral)   Resp 18   Ht 5\' 9"  (1.753 m)   Wt 89.4 kg   SpO2 100%   BMI 29.09 kg/m  Physical Exam Vitals and nursing note reviewed.  HENT:     Head: Normocephalic and atraumatic.  Eyes:     Pupils: Pupils are equal, round, and reactive to light.  Cardiovascular:     Rate and Rhythm: Normal rate and regular rhythm.  Pulmonary:     Effort: Pulmonary effort is  normal.     Breath sounds: Normal breath sounds.  Abdominal:     Palpations: Abdomen is soft.     Tenderness: There is no abdominal tenderness. There is no guarding or rebound.     Comments: Right inguinal hernia easily reduces through inguinal canal No overlying skin changes No other abdominal tenderness  Skin:    General: Skin is warm and dry.  Neurological:     Mental Status: He is alert.  Psychiatric:        Mood and Affect: Mood normal.     ED Results / Procedures / Treatments   Labs (all labs ordered are listed, but only abnormal results are displayed) Labs Reviewed  COMPREHENSIVE METABOLIC PANEL - Abnormal; Notable for the following components:      Result Value   CO2 21 (*)    Glucose, Bld 103 (*)    BUN 5 (*)    Calcium 8.5 (*)    Albumin 2.9 (*)    AST 49 (*)    Alkaline Phosphatase 165 (*)    All other components within normal limits  CBC - Abnormal; Notable for the following components:  WBC 3.9 (*)    RDW 15.7 (*)    Platelets 66 (*)    All other components within normal limits  LIPASE, BLOOD  URINALYSIS, ROUTINE W REFLEX MICROSCOPIC    EKG None  Radiology No results found.  Procedures Procedures    Medications Ordered in ED Medications - No data to display  ED Course/ Medical Decision Making/ A&P                                 Medical Decision Making 67 year old male with history as above presenting for new onset right inguinal hernia.  First noticed this after straining during bowel movement.  Unable to reduce it on his own today.  Laboratory workup unremarkable overall.  Physical exam notable for right inguinal hernia palpable through inguinal canal.  Easily reduced with direct pressure.  No overlying skin changes or significant bowel habits.  Low suspicion for incarcerated or strangulated hernia at this time.  Will instruct for PCP follow-up.  Return precautions that would be worrisome for incarceration or strangulation were discussed with  the patient in detail and he articulated his understanding of these.  Amount and/or Complexity of Data Reviewed Labs: ordered.           Final Clinical Impression(s) / ED Diagnoses Final diagnoses:  Unilateral inguinal hernia without obstruction or gangrene, recurrence not specified    Rx / DC Orders ED Discharge Orders     None         Royanne Foots, DO 04/22/23 1544

## 2023-04-22 NOTE — Discharge Instructions (Signed)
You were seen in the emergency department for a hernia in your right groin We were able to push the hernia back in If you feel it pop out again, you can try pushing it back in using that technique we talked about today and you could also apply an ice pack to help with this You should come back to the emergency department if it is causing you severe pain, you cannot move your bowels or if you have any other concern Otherwise please follow-up with your primary care doctor in 1 week for reevaluation and continue taking all previously prescribed occasions

## 2023-04-23 ENCOUNTER — Ambulatory Visit: Payer: 59 | Admitting: Physician Assistant

## 2023-04-29 ENCOUNTER — Encounter (HOSPITAL_COMMUNITY): Payer: Self-pay

## 2023-04-29 ENCOUNTER — Other Ambulatory Visit: Payer: Self-pay

## 2023-04-29 ENCOUNTER — Emergency Department (HOSPITAL_COMMUNITY)
Admission: EM | Admit: 2023-04-29 | Discharge: 2023-04-29 | Disposition: A | Discharge status: Home / Self Care | Payer: 59 | Attending: Emergency Medicine | Admitting: Emergency Medicine

## 2023-04-29 DIAGNOSIS — K4090 Unilateral inguinal hernia, without obstruction or gangrene, not specified as recurrent: Secondary | ICD-10-CM | POA: Insufficient documentation

## 2023-04-29 MED ORDER — OXYCODONE-ACETAMINOPHEN 5-325 MG PO TABS: 1.0000 | ORAL_TABLET | Freq: Four times a day (QID) | ORAL | 0 refills | Status: DC | PRN

## 2023-04-29 NOTE — ED Provider Notes (Signed)
[place] EMERGENCY DEPARTMENT AT [facility] Provider Note   CSN: [id number] Arrival date & time: 04/29/23  1156     History  Chief Complaint  Patient presents with   Hernia    Luis Morrison is a 67 y.o. male with past medical history significant for alcohol abuse, cirrhosis, esophageal varices who presents concern for ongoing pain with right sided inguinal hernia.  Patient reports that he was seen on 11/12 for same, continues to have worsening pain and swelling when standing.  But reports that the hernia remains reducible at this time.  Patient reports that he was not given the information to speak with the surgeon when he was last in the emergency department.  HPI     Home Medications Prior to Admission medications   Medication Sig Start Date End Date Taking? Authorizing Provider  oxyCODONE-acetaminophen (PERCOCET/ROXICET) 5-325 MG tablet Take 1 tablet by mouth every 6 (six) hours as needed for severe pain (pain score 7-10). 04/29/23  Yes Monia Timmers H, PA-C  nadolol (CORGARD) 20 MG tablet Take 0.5 tablets (10 mg total) by mouth daily. 02/20/23   Marcine Matar, MD  sildenafil (VIAGRA) 50 MG tablet TAKE 1 TABLET BY MOUTH AS NEEDED HALF  HOUR  PRIOR  TO  INTERCOURSE.  LIMIT  USE  TO  1  TAB  EVERY  24  HOURS 03/10/23   Marcine Matar, MD  tiZANidine (ZANAFLEX) 4 MG tablet Take 1 tablet (4 mg total) by mouth every 12 (twelve) hours as needed. 03/06/23   Hoy Register, MD      Allergies    Patient has no known allergies.    Review of Systems   Review of Systems  All other systems reviewed and are negative.   Physical Exam Updated Vital Signs BP 131/76 (BP Location: Right Arm)   Pulse 69   Temp 98.3 F (36.8 C)   Resp 16   Ht 5\' 9"  (1.753 m)   Wt 89.4 kg   SpO2 100%   BMI 29.09 kg/m  Physical Exam Vitals and nursing note reviewed.  Constitutional:      General: He is not in acute distress.    Appearance: Normal appearance.  HENT:      Head: Normocephalic and atraumatic.  Eyes:     General:        Right eye: No discharge.        Left eye: No discharge.  Cardiovascular:     Rate and Rhythm: Normal rate and regular rhythm.  Pulmonary:     Effort: Pulmonary effort is normal. No respiratory distress.  Abdominal:     Comments: Patient with reducible right sided inguinal hernia, no overlying skin redness, or signs of strangulation  Musculoskeletal:        General: No deformity.  Skin:    General: Skin is warm and dry.  Neurological:     Mental Status: He is alert and oriented to person, place, and time.  Psychiatric:        Mood and Affect: Mood normal.        Behavior: Behavior normal.     ED Results / Procedures / Treatments   Labs (all labs ordered are listed, but only abnormal results are displayed) Labs Reviewed - No data to display  EKG None  Radiology No results found.  Procedures Procedures    Medications Ordered in ED Medications - No data to display  ED Course/ Medical Decision Making/ A&P  Medical Decision Making  This patient is a 67 y.o. male who presents to the ED for concern of ongoing right sided inguinal hernia.   Differential diagnoses prior to evaluation: Incarceration, strangulation, versus pain secondary to ongoing hernia without signs of vascular compromise  Past Medical History / Social History / Additional history: Chart reviewed. Pertinent results include: alcohol abuse, cirrhosis, esophageal varices, additionally I reviewed physical exam evaluation in the emergency department on 11/12 for same problem with reduction possible at that time  Physical Exam: Physical exam performed. The pertinent findings include: Reducible right-sided inguinal hernia without any signs of extremity lesion, incarceration, vital signs stable  Medications / Treatment: Discussed with the patient that the only treatment for his hernia would be surgical repair,  unless it begins to show signs of incarceration or strangulation   Disposition: After consideration of the diagnostic results and the patients response to treatment, I feel that patient is stable for discharge, provided contact information for Dr. Dwain Sarna with general surgery to schedule a close follow-up.   emergency department workup does not suggest an emergent condition requiring admission or immediate intervention beyond what has been performed at this time. The plan is: as above. The patient is safe for discharge and has been instructed to return immediately for worsening symptoms, change in symptoms or any other concerns.  Final Clinical Impression(s) / ED Diagnoses Final diagnoses:  Reducible right inguinal hernia    Rx / DC Orders ED Discharge Orders          Ordered    oxyCODONE-acetaminophen (PERCOCET/ROXICET) 5-325 MG tablet  Every 6 hours PRN        04/29/23 1329              Alane Hanssen, [place], PA-C 04/29/23 1334    Rondel Baton, MD 05/03/23 [phone]

## 2023-04-29 NOTE — ED Triage Notes (Signed)
Patient has hernia that he was recently seen for that continues to descend despite being pushed back into place. Patient reports pain affecting sleep and ability to walk. VSS at this time and 7/10 pain reported.

## 2023-04-29 NOTE — Discharge Instructions (Signed)
Please use Tylenol or ibuprofen for pain.  You may use 600 mg ibuprofen every 6 hours or 1000 mg of Tylenol every 6 hours.  You may choose to alternate between the 2.  This would be most effective.  Not to exceed 4 g of Tylenol within 24 hours.  Not to exceed 3200 mg ibuprofen 24 hours.  You can use the stronger pain medication that I prescribed in place of the Tylenol above for severe breakthrough pain.  If you are taking the stronger pain medication that I have recommend that you also take a laxative daily to help with constipation.  Please reach out to the surgeon's contact formation I provided above at your earliest convenience to schedule a surgery for hernia repair.  Please return to the emergency department if you have significant increasing pain of the hernia despite laying flat, or you become unable to push the hernia back into place on your own.

## 2023-05-01 ENCOUNTER — Ambulatory Visit: Payer: Self-pay | Admitting: General Surgery

## 2023-05-01 DIAGNOSIS — K4090 Unilateral inguinal hernia, without obstruction or gangrene, not specified as recurrent: Secondary | ICD-10-CM | POA: Diagnosis not present

## 2023-05-01 NOTE — H&P (Signed)
Chief Complaint: New Consultation and Inguinal Hernia     History of Present Illness: Luis Morrison is a 67 y.o. male who is seen today as an office consultation at the request of Dr. Laural Benes for evaluation of New Consultation and Inguinal Hernia .   Patient is a 67 year old male who comes in secondary to a right inguinal hernia.  Patient has a history of cirrhosis, alcoholism.  Patient states that the hernias been there for the last 2 weeks.  He was recently seen in the ER secondary to pain and discomfort.  Patient states he is able to reduce the hernia also but however he does get larger after being on his feet.  He states that he had no signs or symptoms of incarceration or strangulation.  Patient does appear to have a history of hepatitis C.  Patient denies any paracentesis needed from his abdomen.     Review of Systems: A complete review of systems was obtained from the patient.  I have reviewed this information and discussed as appropriate with the patient.  See HPI as well for other ROS.  Review of Systems  Constitutional:  Negative for fever.  HENT:  Negative for congestion.   Eyes:  Negative for blurred vision.  Respiratory:  Negative for cough, shortness of breath and wheezing.   Cardiovascular:  Negative for chest pain and palpitations.  Gastrointestinal:  Negative for heartburn.  Genitourinary:  Negative for dysuria.  Musculoskeletal:  Negative for myalgias.  Skin:  Negative for rash.  Neurological:  Negative for dizziness and headaches.  Psychiatric/Behavioral:  Negative for depression and suicidal ideas.   All other systems reviewed and are negative.     Medical History: History reviewed. No pertinent past medical history.  There is no problem list on file for this patient.   History reviewed. No pertinent surgical history.   No Known Allergies  Current Outpatient Medications on File Prior to Visit  Medication Sig Dispense Refill   nadoloL (CORGARD) 20  MG tablet Take 10 mg by mouth once daily     oxyCODONE-acetaminophen (PERCOCET) 5-325 mg tablet Take 1 tablet by mouth     No current facility-administered medications on file prior to visit.    History reviewed. No pertinent family history.   Social History   Tobacco Use  Smoking Status Never  Smokeless Tobacco Never     Social History   Socioeconomic History   Marital status: Single  Tobacco Use   Smoking status: Never   Smokeless tobacco: Never  Substance and Sexual Activity   Alcohol use: Not Currently   Drug use: Never   Social Drivers of Health   Financial Resource Strain: Low Risk  (10/15/2022)   Received from [facility]   Overall Financial Resource Strain (CARDIA)    Difficulty of Paying Living Expenses: Not hard at all  Food Insecurity: No Food Insecurity (10/15/2022)   Received from [facility]   Hunger Vital Sign    Worried About Running Out of Food in the Last Year: Never true    Ran Out of Food in the Last Year: Never true  Transportation Needs: No Transportation Needs (10/15/2022)   Received from [facility] - [facility]    Lack of Transportation (Medical): No    Lack of Transportation (Non-Medical): No  Physical Activity: Sufficiently Active (10/15/2022)   Received from [facility]   Exercise Vital Sign    Days of Exercise per Week: 5 days    Minutes of Exercise per  Session: 30 min  Stress: No Stress Concern Present (10/15/2022)   Received from [facility] - Occupational Stress Questionnaire    Feeling of Stress : Not at all  Social Connections: Moderately Isolated (10/15/2022)   Received from [facility]   Social Connection and Isolation Panel [NHANES]    Frequency of Communication with Friends and Family: More than three times a week    Frequency of Social Gatherings with Friends and Family: Three times a week    Attends Religious Services: 1 to 4 times per year    Active Member of Clubs or  Organizations: No    Attends Banker Meetings: Never    Marital Status: Never married    Objective:    Vitals:   05/01/23 1413  BP: (!) 140/86  Pulse: 76  Temp: 36.1 C (97 F)  SpO2: 98%  Weight: 89.8 kg (198 lb)  Height: 175.3 cm (5\' 9" )    Body mass index is 29.24 kg/m. Physical Exam Constitutional:      Appearance: Normal appearance.  HENT:     Head: Normocephalic and atraumatic.     Nose: Nose normal. No congestion.     Mouth/Throat:     Mouth: Mucous membranes are moist.     Pharynx: Oropharynx is clear.  Eyes:     Pupils: Pupils are equal, round, and reactive to light.  Cardiovascular:     Rate and Rhythm: Normal rate and regular rhythm.     Pulses: Normal pulses.     Heart sounds: Normal heart sounds. No murmur heard.    No friction rub. No gallop.  Pulmonary:     Effort: Pulmonary effort is normal. No respiratory distress.     Breath sounds: Normal breath sounds. No stridor. No wheezing, rhonchi or rales.  Abdominal:     General: Abdomen is flat.     Hernia: A hernia is present. Hernia is present in the left inguinal area and right inguinal area.  Musculoskeletal:        General: Normal range of motion.     Cervical back: Normal range of motion.  Skin:    General: Skin is warm and dry.  Neurological:     General: No focal deficit present.     Mental Status: He is alert and oriented to person, place, and time.  Psychiatric:        Mood and Affect: Mood normal.        Thought Content: Thought content normal.       Assessment and Plan:  Diagnoses and all orders for this visit:  Unilateral inguinal hernia without obstruction or gangrene, recurrence not specified    Luis Morrison is a 67 y.o. male    We will proceed to the OR for a laparoscopic right inguinal hernia repair with mesh. All risks and benefits were discussed with the patient, to generally include infection, bleeding, damage to surrounding structures, acute and chronic nerve  pain, and recurrence. Alternatives were offered and described.  All questions were answered and the patient voiced understanding of the procedure and wishes to proceed at this point.       No follow-ups on file.  Axel Filler, MD, [facility], [place] [facility]

## 2023-05-05 ENCOUNTER — Telehealth: Payer: Self-pay | Admitting: *Deleted

## 2023-05-05 NOTE — Progress Notes (Signed)
  Care Coordination  Outreach Note  05/05/2023 Name: Luis Morrison MRN: [id number] DOB: June 01, 1956   Care Coordination Outreach Attempts: An unsuccessful telephone outreach was attempted today to offer the patient information about available care coordination services.  Follow Up Plan:  Additional outreach attempts will be made to offer the patient care coordination information and services.   Encounter Outcome:  No Answer  Gwenevere Ghazi  Care Coordination Care Guide  Direct Dial: [phone]

## 2023-05-06 ENCOUNTER — Encounter (HOSPITAL_COMMUNITY): Payer: Self-pay | Admitting: Ophthalmology

## 2023-05-06 ENCOUNTER — Other Ambulatory Visit (HOSPITAL_COMMUNITY): Payer: Self-pay | Admitting: Ophthalmology

## 2023-05-06 DIAGNOSIS — H5347 Heteronymous bilateral field defects: Secondary | ICD-10-CM

## 2023-05-12 NOTE — Progress Notes (Signed)
  Care Coordination  Outreach Note  05/12/2023 Name: Luis Morrison MRN: [id number] DOB: 05-22-1956   Care Coordination Outreach Attempts: A second unsuccessful outreach was attempted today to offer the patient with information about available care coordination services.  Follow Up Plan:  Additional outreach attempts will be made to offer the patient care coordination information and services.   Encounter Outcome:  No Answer  Gwenevere Ghazi  Care Coordination Care Guide  Direct Dial: [phone]

## 2023-05-12 NOTE — Progress Notes (Signed)
  Care Coordination   Note   05/12/2023 Name: KAELUB BLITCH MRN: [id number] DOB: 1955-06-19  [name] is a 67 y.o. year old male who sees Marcine Matar, MD for primary care. I reached out to [name] by phone today to offer care coordination services.  Mr. Herzfeld was given information about Care Coordination services today including:   The Care Coordination services include support from the care team which includes your Nurse Coordinator, Clinical Social Worker, or Pharmacist.  The Care Coordination team is here to help remove barriers to the health concerns and goals most important to you. Care Coordination services are voluntary, and the patient may decline or stop services at any time by request to their care team member.   Care Coordination Consent Status: Patient did not agree to participate in care coordination services at this time.    Encounter Outcome:  Patient Refused  [facility]  Care Coordination Care Guide  Direct Dial: [phone]

## 2023-05-14 ENCOUNTER — Inpatient Hospital Stay (HOSPITAL_COMMUNITY): Admission: RE | Admit: 2023-05-14 | Payer: 59 | Source: Ambulatory Visit

## 2023-05-14 ENCOUNTER — Telehealth: Payer: Self-pay

## 2023-05-14 ENCOUNTER — Ambulatory Visit (HOSPITAL_COMMUNITY): Payer: 59

## 2023-05-14 NOTE — Telephone Encounter (Signed)
Copied from CRM [id number]. Topic: General - Other >> May 14, 2023 11:05 AM Macon Large wrote: Reason for CRM: Pt stated that he rescheduled his imaging appt due to it being so cold. Pt stated that he is 67 yrs old and had to wait in the cold for the bus so he just cancelled the appt and will reschedule when the weather is warmer.

## 2023-05-21 ENCOUNTER — Ambulatory Visit: Payer: Self-pay

## 2023-05-21 NOTE — Telephone Encounter (Signed)
Chief Complaint: Medication Refill  Symptoms: Hernia Pain Disposition: [] ED /[] Urgent Care (no appt availability in office) / [] Appointment(In office/virtual)/ []  [place] [facility]/ [] Home Care/ [] Refused Recommended Disposition /[] [place] [facility]/ [x]  Follow-up with PCP Additional Notes: Patient requesting a refill for his pain medication to get him to his surgery date.  Patient received an oxycodone-acetaminophen 5-325 MG from the ED at discharge on 04/28/23. Patient is scheduled for surgery on 06/23/23. Patient states the medication is helping with the pain he is having.   Summary: Requesting pain medicine for surgery   Pt has pain symptoms and wants to receive pain medicine. He says it does not have to be oxyCODONE-acetaminophen (PERCOCET/ROXICET) 5-325 MG tablet however he just needs something for his upcoming surgery 06/23/2023.     Reason for Disposition  Caller requesting a CONTROLLED substance prescription refill (e.g., narcotics, ADHD medicines)  Answer Assessment - Initial Assessment Questions 1. DRUG NAME: "What medicine do you need to have refilled?"     oxyCODONE-acetaminophen (PERCOCET/ROXICET) 5-325 MG 2. REFILLS REMAINING: "How many refills are remaining?" (Note: The label on the medicine or pill bottle will show how many refills are remaining. If there are no refills remaining, then a renewal may be needed.)     None 3. EXPIRATION DATE: "What is the expiration date?" (Note: The label states when the prescription will expire, and thus can no longer be refilled.)     Unknown 4. PRESCRIBING HCP: "Who prescribed it?" Reason: If prescribed by specialist, call should be referred to that group.     ED provider  5. SYMPTOMS: "Do you have any symptoms?"     Hernia pain  Protocols used: Medication Refill and Renewal Call-A-AH

## 2023-05-21 NOTE — Telephone Encounter (Signed)
Requested medication (s) are due for refill today: Yes  Requested medication (s) are on the active medication list: Yes  Last refill:  04/29/23  Future visit scheduled: Yes  Notes to clinic:  Unable to refill per protocol, cannot delegate.      Requested Prescriptions  Pending Prescriptions Disp Refills   oxyCODONE-acetaminophen (PERCOCET/ROXICET) 5-325 MG tablet 15 tablet 0    Sig: Take 1 tablet by mouth every 6 (six) hours as needed for severe pain (pain score 7-10).     Not Delegated - Analgesics:  Opioid Agonist Combinations Failed - 05/21/2023 10:48 AM      Failed - This refill cannot be delegated      Failed - Urine Drug Screen completed in last 360 days      Passed - Valid encounter within last 3 months    Recent Outpatient Visits           1 month ago Non-recurrent unilateral inguinal hernia without obstruction or gangrene   [facility] [place]. [facility] Marcine Matar, MD   3 months ago Essential hypertension   [facility] Of [place]. [facility] Marcine Matar, MD   1 year ago Hospital discharge follow-up   [place] [facility] Dept Of [place]. [facility] Marcine Matar, MD   1 year ago Acute cough   [facility] [facility] Dept Of [place]. [facility] Marcine Matar, MD   2 years ago Essential hypertension   [facility] [place] [facility] Dept Of . [facility] Marcine Matar, MD       Future Appointments             In 2 months Laural Benes Binnie Rail, MD [facility] [place] - A Dept Of [facility]. [facility]

## 2023-05-22 ENCOUNTER — Ambulatory Visit: Payer: 59 | Admitting: Pharmacist

## 2023-05-22 ENCOUNTER — Telehealth: Payer: Self-pay | Admitting: Internal Medicine

## 2023-05-22 NOTE — Telephone Encounter (Signed)
Pt is calling in because he wants to know why his medication oxyCODONE-acetaminophen (PERCOCET/ROXICET) 5-325 MG tablet [[id number]] has not been called in to the pharmacy. Pt says he needs his medication because he has a surgery coming up and knows he's going to be in pain. Pt is requesting someone from the office call him back as soon as possible. Please follow up with pt.

## 2023-05-22 NOTE — Telephone Encounter (Signed)
Spoke with patient . Patient voiced that he was on the bus and could not hear very well. Voiced that he was going to call back.   When patient returns call please let him know that he will need to reach out to the provider that will be preforming his surgery . oxyCODONE-acetaminophen (PERCOCET/ROXICET) 5-325 MG tablet was written by ED doctor and not his provider. We advise the he contact his surgeon. This message will also be sent to his PCP so they will be aware of his request.

## 2023-06-23 ENCOUNTER — Ambulatory Visit (HOSPITAL_COMMUNITY): Admit: 2023-06-23 | Payer: 59 | Admitting: General Surgery

## 2023-06-23 SURGERY — REPAIR, HERNIA, INGUINAL, LAPAROSCOPIC
Anesthesia: General | Laterality: Right

## 2023-07-24 ENCOUNTER — Ambulatory Visit: Payer: 59 | Admitting: Internal Medicine

## 2023-08-20 ENCOUNTER — Ambulatory Visit: Payer: Self-pay | Admitting: General Surgery

## 2023-08-20 NOTE — Pre-Procedure Instructions (Signed)
 Surgical Instructions   Your procedure is scheduled on September 01, 2023. Report to [facility] Main Entrance "A" at 10:45 A.M., then check in with the Admitting office. Any questions or running late day of surgery: call [phone]  Questions prior to your surgery date: call [phone], Monday-Friday, 8am-4pm. If you experience any cold or flu symptoms such as cough, fever, chills, shortness of breath, etc. between now and your scheduled surgery, please notify us at the above number.     Remember:  Do not eat after midnight the night before your surgery   You may drink clear liquids until 9:45 AM the morning of your surgery.   Clear liquids allowed are: Water, Non-Citrus Juices (without pulp), Carbonated Beverages, Clear Tea (no milk, honey, etc.), Black Coffee Only (NO MILK, CREAM OR POWDERED CREAMER of any kind), and Gatorade.  Patient Instructions  The night before surgery:  No food after midnight. ONLY clear liquids after midnight  The day of surgery (if you do NOT have diabetes):  Drink ONE (1) Pre-Surgery Clear Ensure by 9:45 AM the morning of surgery. Drink in one sitting. Do not sip.  This drink was given to you during your hospital  pre-op appointment visit.  Nothing else to drink after completing the  Pre-Surgery Clear Ensure.         If you have questions, please contact your surgeon's office.   Take these medicines the morning of surgery with A SIP OF WATER: dorzolamide-timolol (COSOPT) ophthalmic solution  nadolol (CORGARD)    May take these medicines IF NEEDED: Naphazoline HCl (CLEAR EYES OP) eye drops   One week prior to surgery, STOP taking any Aspirin (unless otherwise instructed by your surgeon) Aleve, Naproxen, Ibuprofen, Motrin, Advil, Goody's, BC's, all herbal medications, fish oil, and non-prescription vitamins.                     Do NOT Smoke (Tobacco/Vaping) for 24 hours prior to your procedure.  If you use a CPAP at night, you may bring your  mask/headgear for your overnight stay.   You will be asked to remove any contacts, glasses, piercing's, hearing aid's, dentures/partials prior to surgery. Please bring cases for these items if needed.    Patients discharged the day of surgery will not be allowed to drive home, and someone needs to stay with them for 24 hours.  SURGICAL WAITING ROOM VISITATION Patients may have no more than 2 support people in the waiting area - these visitors may rotate.   Pre-op nurse will coordinate an appropriate time for 1 ADULT support person, who may not rotate, to accompany patient in pre-op.  Children under the age of 58 must have an adult with them who is not the patient and must remain in the main waiting area with an adult.  If the patient needs to stay at the hospital during part of their recovery, the visitor guidelines for inpatient rooms apply.  Please refer to the [facility] website for the visitor guidelines for any additional information.   If you received a COVID test during your pre-op visit  it is requested that you wear a mask when out in public, stay away from anyone that may not be feeling well and notify your surgeon if you develop symptoms. If you have been in contact with anyone that has tested positive in the last 10 days please notify you surgeon.      Pre-operative CHG Bathing Instructions   You can play a key  role in reducing the risk of infection after surgery. Your skin needs to be as free of germs as possible. You can reduce the number of germs on your skin by washing with CHG (chlorhexidine gluconate) soap before surgery. CHG is an antiseptic soap that kills germs and continues to kill germs even after washing.   DO NOT use if you have an allergy to chlorhexidine/CHG or antibacterial soaps. If your skin becomes reddened or irritated, stop using the CHG and notify one of our RNs at [phone].              TAKE A SHOWER THE NIGHT BEFORE SURGERY AND THE DAY OF SURGERY     Please keep in mind the following:  DO NOT shave, including legs and underarms, 48 hours prior to surgery.   You may shave your face before/day of surgery.  Place clean sheets on your bed the night before surgery Use a clean washcloth (not used since being washed) for each shower. DO NOT sleep with pet's night before surgery.  CHG Shower Instructions:  Wash your face and private area with normal soap. If you choose to wash your hair, wash first with your normal shampoo.  After you use shampoo/soap, rinse your hair and body thoroughly to remove shampoo/soap residue.  Turn the water OFF and apply half the bottle of CHG soap to a CLEAN washcloth.  Apply CHG soap ONLY FROM YOUR NECK DOWN TO YOUR TOES (washing for 3-5 minutes)  DO NOT use CHG soap on face, private areas, open wounds, or sores.  Pay special attention to the area where your surgery is being performed.  If you are having back surgery, having someone wash your back for you may be helpful. Wait 2 minutes after CHG soap is applied, then you may rinse off the CHG soap.  Pat dry with a clean towel  Put on clean pajamas    Additional instructions for the day of surgery: DO NOT APPLY any lotions, deodorants, cologne, or perfumes.   Do not wear jewelry or makeup Do not wear nail polish, gel polish, artificial nails, or any other type of covering on natural nails (fingers and toes) Do not bring valuables to the hospital. [facility] is not responsible for valuables/personal belongings. Put on clean/comfortable clothes.  Please brush your teeth.  Ask your nurse before applying any prescription medications to the skin.

## 2023-08-21 ENCOUNTER — Encounter (HOSPITAL_COMMUNITY)
Admission: RE | Admit: 2023-08-21 | Discharge: 2023-08-21 | Disposition: A | Discharge status: Home / Self Care | Source: Ambulatory Visit | Attending: General Surgery | Admitting: General Surgery

## 2023-08-21 ENCOUNTER — Other Ambulatory Visit: Payer: Self-pay

## 2023-08-21 ENCOUNTER — Encounter (HOSPITAL_COMMUNITY): Payer: Self-pay

## 2023-08-21 VITALS — BP 152/91 | HR 73 | Temp 97.7°F | Resp 18 | Ht 70.0 in | Wt 198.8 lb

## 2023-08-21 DIAGNOSIS — K769 Liver disease, unspecified: Secondary | ICD-10-CM | POA: Insufficient documentation

## 2023-08-21 DIAGNOSIS — Z01812 Encounter for preprocedural laboratory examination: Secondary | ICD-10-CM | POA: Diagnosis not present

## 2023-08-21 DIAGNOSIS — Z01818 Encounter for other preprocedural examination: Secondary | ICD-10-CM

## 2023-08-21 LAB — COMPREHENSIVE METABOLIC PANEL
ALT: 34 U/L (ref 0–44)
AST: 54 U/L — ABNORMAL HIGH (ref 15–41)
Albumin: 3 g/dL — ABNORMAL LOW (ref 3.5–5.0)
Alkaline Phosphatase: 148 U/L — ABNORMAL HIGH (ref 38–126)
Anion gap: 9 (ref 5–15)
BUN: 7 mg/dL — ABNORMAL LOW (ref 8–23)
CO2: 21 mmol/L — ABNORMAL LOW (ref 22–32)
Calcium: 8.5 mg/dL — ABNORMAL LOW (ref 8.9–10.3)
Chloride: 108 mmol/L (ref 98–111)
Creatinine, Ser: 0.85 mg/dL (ref 0.61–1.24)
GFR, Estimated: >60 mL/min (ref >60)
Glucose, Bld: 99 mg/dL (ref 70–99)
Potassium: 4 mmol/L (ref 3.5–5.1)
Sodium: 138 mmol/L (ref 135–145)
Total Bilirubin: 1.3 mg/dL — ABNORMAL HIGH (ref 0.0–1.2)
Total Protein: 7.4 g/dL (ref 6.5–8.1)

## 2023-08-21 LAB — CBC
HCT: 42.4 % (ref 39.0–52.0)
Hemoglobin: 14 g/dL (ref 13.0–17.0)
MCH: 28.3 pg (ref 26.0–34.0)
MCHC: 33 g/dL (ref 30.0–36.0)
MCV: 85.7 fL (ref 80.0–100.0)
Platelets: 49 10*3/uL — ABNORMAL LOW (ref 150–400)
RBC: 4.95 MIL/uL (ref 4.22–5.81)
RDW: 16 % — ABNORMAL HIGH (ref 11.5–15.5)
WBC: 3.7 10*3/uL — ABNORMAL LOW (ref 4.0–10.5)
nRBC: 0 % (ref 0.0–0.2)

## 2023-08-21 NOTE — Progress Notes (Signed)
 PCP - Dr. Jonah Blue Cardiologist - Denies  PPM/ICD - Denies Device Orders - n/a Rep Notified - n/a  Chest x-ray - n/a EKG - Denies Stress Test - Denies ECHO - Denies Cardiac Cath - Denies  Sleep Study - Denies CPAP - n/a  No DM  Last dose of GLP1 agonist- n/a GLP1 instructions: n/a  Blood Thinner Instructions: n/a Aspirin Instructions: n/a  ERAS Protcol - Clear liquids until 0945 morning of surgery PRE-SURGERY Ensure or G2- Ensure given to pt with instructions  COVID TEST- n/a   Anesthesia review: No. Pt does not take nadolol for cardiac issues, but prescribed by PCP for liver cirrhosis.   Patient denies shortness of breath, fever, cough and chest pain at PAT appointment. Pt denies any respiratory illness/infection in the last two months.   All instructions explained to the patient, with a verbal understanding of the material. Patient agrees to go over the instructions while at home for a better understanding. Patient also instructed to self quarantine after being tested for COVID-19. The opportunity to ask questions was provided.

## 2023-09-01 ENCOUNTER — Other Ambulatory Visit: Payer: Self-pay

## 2023-09-01 ENCOUNTER — Encounter (HOSPITAL_COMMUNITY)
Admission: RE | Disposition: A | Discharge status: Home / Self Care | Payer: Self-pay | Source: Home / Self Care | Attending: General Surgery

## 2023-09-01 ENCOUNTER — Ambulatory Visit (HOSPITAL_COMMUNITY)
Admission: RE | Admit: 2023-09-01 | Discharge: 2023-09-01 | Disposition: A | Discharge status: Home / Self Care | Payer: 59 | Attending: General Surgery | Admitting: General Surgery

## 2023-09-01 ENCOUNTER — Encounter (HOSPITAL_COMMUNITY): Payer: Self-pay | Admitting: General Surgery

## 2023-09-01 ENCOUNTER — Ambulatory Visit (HOSPITAL_COMMUNITY)

## 2023-09-01 ENCOUNTER — Ambulatory Visit (HOSPITAL_BASED_OUTPATIENT_CLINIC_OR_DEPARTMENT_OTHER)

## 2023-09-01 DIAGNOSIS — K7460 Unspecified cirrhosis of liver: Secondary | ICD-10-CM | POA: Insufficient documentation

## 2023-09-01 DIAGNOSIS — I8500 Esophageal varices without bleeding: Secondary | ICD-10-CM | POA: Insufficient documentation

## 2023-09-01 DIAGNOSIS — Z8619 Personal history of other infectious and parasitic diseases: Secondary | ICD-10-CM | POA: Insufficient documentation

## 2023-09-01 DIAGNOSIS — K4090 Unilateral inguinal hernia, without obstruction or gangrene, not specified as recurrent: Secondary | ICD-10-CM

## 2023-09-01 HISTORY — DX: Personal history of other diseases of the digestive system: Z87.19

## 2023-09-01 HISTORY — PX: INSERTION OF MESH: SHX5868

## 2023-09-01 HISTORY — PX: INGUINAL HERNIA REPAIR: SHX194

## 2023-09-01 HISTORY — DX: Inflammatory liver disease, unspecified: K75.9

## 2023-09-01 LAB — CBC
HCT: 42.9 % (ref 39.0–52.0)
Hemoglobin: 13.9 g/dL (ref 13.0–17.0)
MCH: 27.8 pg (ref 26.0–34.0)
MCHC: 32.4 g/dL (ref 30.0–36.0)
MCV: 85.8 fL (ref 80.0–100.0)
Platelets: 51 10*3/uL — ABNORMAL LOW (ref 150–400)
RBC: 5 MIL/uL (ref 4.22–5.81)
RDW: 15.4 % (ref 11.5–15.5)
WBC: 3.4 10*3/uL — ABNORMAL LOW (ref 4.0–10.5)
nRBC: 0 % (ref 0.0–0.2)

## 2023-09-01 SURGERY — REPAIR, HERNIA, INGUINAL, LAPAROSCOPIC
Anesthesia: General | Site: Abdomen | Laterality: Right

## 2023-09-01 MED ORDER — ONDANSETRON HCL 4 MG/2ML IJ SOLN
INTRAMUSCULAR | Status: AC
  Filled 2023-09-01: qty 2

## 2023-09-01 MED ORDER — FENTANYL CITRATE (PF) 250 MCG/5ML IJ SOLN
INTRAMUSCULAR | Status: DC | PRN
  Administered 2023-09-01 (×2): 50 ug via INTRAVENOUS

## 2023-09-01 MED ORDER — BUPIVACAINE HCL (PF) 0.25 % IJ SOLN
INTRAMUSCULAR | Status: AC
  Filled 2023-09-01: qty 30

## 2023-09-01 MED ORDER — OXYCODONE HCL 5 MG PO TABS
ORAL_TABLET | ORAL | Status: AC
  Filled 2023-09-01: qty 1

## 2023-09-01 MED ORDER — OXYCODONE HCL 5 MG PO TABS
5.0000 mg | ORAL_TABLET | Freq: Once | ORAL | Status: AC | PRN
  Administered 2023-09-01: 5 mg via ORAL

## 2023-09-01 MED ORDER — ENSURE PRE-SURGERY PO LIQD: 296.0000 mL | Freq: Once | ORAL | Status: DC

## 2023-09-01 MED ORDER — ROCURONIUM BROMIDE 10 MG/ML (PF) SYRINGE
PREFILLED_SYRINGE | INTRAVENOUS | Status: DC | PRN
  Administered 2023-09-01: 30 mg via INTRAVENOUS
  Administered 2023-09-01: 50 mg via INTRAVENOUS

## 2023-09-01 MED ORDER — CHLORHEXIDINE GLUCONATE 0.12 % MT SOLN
15.0000 mL | Freq: Once | OROMUCOSAL | Status: AC
  Administered 2023-09-01: 15 mL via OROMUCOSAL
  Filled 2023-09-01: qty 15

## 2023-09-01 MED ORDER — ACETAMINOPHEN 500 MG PO TABS
500.0000 mg | ORAL_TABLET | ORAL | Status: AC
  Administered 2023-09-01: 500 mg via ORAL
  Filled 2023-09-01: qty 1

## 2023-09-01 MED ORDER — MIDAZOLAM HCL 2 MG/2ML IJ SOLN
INTRAMUSCULAR | Status: AC
  Filled 2023-09-01: qty 2

## 2023-09-01 MED ORDER — 0.9 % SODIUM CHLORIDE (POUR BTL) OPTIME
TOPICAL | Status: DC | PRN
  Administered 2023-09-01: 1000 mL

## 2023-09-01 MED ORDER — PROPOFOL 10 MG/ML IV BOLUS
INTRAVENOUS | Status: DC | PRN
  Administered 2023-09-01: 120 mg via INTRAVENOUS

## 2023-09-01 MED ORDER — SUGAMMADEX SODIUM 200 MG/2ML IV SOLN
INTRAVENOUS | Status: DC | PRN
  Administered 2023-09-01: 200 mg via INTRAVENOUS

## 2023-09-01 MED ORDER — ONDANSETRON HCL 4 MG/2ML IJ SOLN
INTRAMUSCULAR | Status: DC | PRN
  Administered 2023-09-01: 4 mg via INTRAVENOUS

## 2023-09-01 MED ORDER — CEFAZOLIN SODIUM-DEXTROSE 2-4 GM/100ML-% IV SOLN
2.0000 g | INTRAVENOUS | Status: AC
  Administered 2023-09-01: 2 g via INTRAVENOUS
  Filled 2023-09-01: qty 100

## 2023-09-01 MED ORDER — MIDAZOLAM HCL 2 MG/2ML IJ SOLN
INTRAMUSCULAR | Status: DC | PRN
  Administered 2023-09-01: 2 mg via INTRAVENOUS

## 2023-09-01 MED ORDER — CHLORHEXIDINE GLUCONATE CLOTH 2 % EX PADS: 6.0000 | MEDICATED_PAD | Freq: Once | CUTANEOUS | Status: DC

## 2023-09-01 MED ORDER — ACETAMINOPHEN 500 MG PO TABS: 1000.0000 mg | ORAL_TABLET | ORAL | Status: DC

## 2023-09-01 MED ORDER — BUPIVACAINE HCL 0.25 % IJ SOLN
INTRAMUSCULAR | Status: DC | PRN
  Administered 2023-09-01: 7 mL

## 2023-09-01 MED ORDER — FENTANYL CITRATE (PF) 100 MCG/2ML IJ SOLN
25.0000 ug | INTRAMUSCULAR | Status: DC | PRN
  Administered 2023-09-01 (×2): 25 ug via INTRAVENOUS

## 2023-09-01 MED ORDER — OXYCODONE HCL 5 MG/5ML PO SOLN: 5.0000 mg | Freq: Once | ORAL | Status: AC | PRN

## 2023-09-01 MED ORDER — LIDOCAINE 2% (20 MG/ML) 5 ML SYRINGE
INTRAMUSCULAR | Status: DC | PRN
  Administered 2023-09-01: 60 mg via INTRAVENOUS

## 2023-09-01 MED ORDER — PROPOFOL 10 MG/ML IV BOLUS
INTRAVENOUS | Status: AC
  Filled 2023-09-01: qty 20

## 2023-09-01 MED ORDER — DEXAMETHASONE SODIUM PHOSPHATE 10 MG/ML IJ SOLN
INTRAMUSCULAR | Status: DC | PRN
  Administered 2023-09-01: 10 mg via INTRAVENOUS

## 2023-09-01 MED ORDER — ORAL CARE MOUTH RINSE: 15.0000 mL | Freq: Once | OROMUCOSAL | Status: AC

## 2023-09-01 MED ORDER — LACTATED RINGERS IV SOLN: INTRAVENOUS | Status: DC

## 2023-09-01 MED ORDER — FENTANYL CITRATE (PF) 250 MCG/5ML IJ SOLN
INTRAMUSCULAR | Status: AC
  Filled 2023-09-01: qty 5

## 2023-09-01 MED ORDER — FENTANYL CITRATE (PF) 100 MCG/2ML IJ SOLN
INTRAMUSCULAR | Status: DC
Start: 2023-09-01 — End: 2023-09-01
  Filled 2023-09-01: qty 2

## 2023-09-01 MED ORDER — DEXAMETHASONE SODIUM PHOSPHATE 10 MG/ML IJ SOLN
INTRAMUSCULAR | Status: AC
  Filled 2023-09-01: qty 1

## 2023-09-01 MED ORDER — DROPERIDOL 2.5 MG/ML IJ SOLN: 0.6250 mg | Freq: Once | INTRAMUSCULAR | Status: DC | PRN

## 2023-09-01 MED ORDER — TRAMADOL HCL 50 MG PO TABS: 50.0000 mg | ORAL_TABLET | Freq: Four times a day (QID) | ORAL | 0 refills | Status: DC | PRN

## 2023-09-01 SURGICAL SUPPLY — 37 items
BAG COUNTER SPONGE SURGICOUNT (BAG) ×1 IMPLANT
CANISTER SUCT 3000ML PPV (MISCELLANEOUS) IMPLANT
COVER SURGICAL LIGHT HANDLE (MISCELLANEOUS) ×1 IMPLANT
DERMABOND ADVANCED .7 DNX12 (GAUZE/BANDAGES/DRESSINGS) ×1 IMPLANT
DISSECTOR BLUNT TIP ENDO 5MM (MISCELLANEOUS) IMPLANT
ELECT REM PT RETURN 9FT ADLT (ELECTROSURGICAL) ×1 IMPLANT
ELECTRODE REM PT RTRN 9FT ADLT (ELECTROSURGICAL) ×1 IMPLANT
ENDOLOOP SUT PDS II 0 18 (SUTURE) IMPLANT
GLOVE BIO SURGEON STRL SZ7.5 (GLOVE) ×2 IMPLANT
GOWN STRL REUS W/ TWL LRG LVL3 (GOWN DISPOSABLE) ×2 IMPLANT
GOWN STRL REUS W/ TWL XL LVL3 (GOWN DISPOSABLE) ×1 IMPLANT
IRRIG SUCT STRYKERFLOW 2 WTIP (MISCELLANEOUS) IMPLANT
IRRIGATION SUCT STRKRFLW 2 WTP (MISCELLANEOUS) IMPLANT
KIT BASIN OR (CUSTOM PROCEDURE TRAY) ×1 IMPLANT
KIT TURNOVER KIT B (KITS) ×1 IMPLANT
MESH 3DMAX 5X7 RT XLRG (Mesh General) IMPLANT
NDL INSUFFLATION 14GA 120MM (NEEDLE) IMPLANT
NEEDLE INSUFFLATION 14GA 120MM (NEEDLE) ×1 IMPLANT
NS IRRIG 1000ML POUR BTL (IV SOLUTION) ×1 IMPLANT
PAD ARMBOARD POSITIONER FOAM (MISCELLANEOUS) ×2 IMPLANT
RELOAD STAPLE 4.0 BLU F/HERNIA (INSTRUMENTS) IMPLANT
RELOAD STAPLE 4.8 BLK F/HERNIA (STAPLE) IMPLANT
RELOAD STAPLE HERNIA 4.0 BLUE (INSTRUMENTS) IMPLANT
RELOAD STAPLE HERNIA 4.8 BLK (STAPLE) IMPLANT
SCISSORS LAP 5X35 DISP (ENDOMECHANICALS) ×1 IMPLANT
SET TUBE SMOKE EVAC HIGH FLOW (TUBING) ×1 IMPLANT
STAPLER HERNIA 12 8.5 360D (INSTRUMENTS) IMPLANT
SUT MNCRL AB 4-0 PS2 18 (SUTURE) ×1 IMPLANT
SUT VIC AB 1 CT1 27XBRD ANBCTR (SUTURE) IMPLANT
TOWEL GREEN STERILE (TOWEL DISPOSABLE) ×1 IMPLANT
TOWEL GREEN STERILE FF (TOWEL DISPOSABLE) ×1 IMPLANT
TRAY LAPAROSCOPIC MC (CUSTOM PROCEDURE TRAY) ×1 IMPLANT
TROCAR OPTICAL SHORT 5MM (TROCAR) ×1 IMPLANT
TROCAR OPTICAL SLV SHORT 5MM (TROCAR) ×1 IMPLANT
TROCAR Z THREAD OPTICAL 12X100 (TROCAR) ×1 IMPLANT
WARMER LAPAROSCOPE (MISCELLANEOUS) ×1 IMPLANT
WATER STERILE IRR 1000ML POUR (IV SOLUTION) ×1 IMPLANT

## 2023-09-01 NOTE — Anesthesia Procedure Notes (Signed)
 Procedure Name: Intubation Date/Time: 09/01/2023 1:07 PM  Performed by: Marcy Siren, RNPre-anesthesia Checklist: Patient identified, Emergency Drugs available, Suction available and Patient being monitored Patient Re-evaluated:Patient Re-evaluated prior to induction Oxygen Delivery Method: Circle System Utilized Preoxygenation: Pre-oxygenation with 100% oxygen Induction Type: IV induction Ventilation: Mask ventilation without difficulty and Oral airway inserted - appropriate to patient size Laryngoscope Size: Mac and 4 Grade View: Grade I Tube type: Oral Tube size: 7.5 mm Number of attempts: 1 Airway Equipment and Method: Stylet and Oral airway Placement Confirmation: ETT inserted through vocal cords under direct vision, positive ETCO2 and breath sounds checked- equal and bilateral Secured at: 24 cm Tube secured with: Tape Dental Injury: Teeth and Oropharynx as per pre-operative assessment

## 2023-09-01 NOTE — Progress Notes (Signed)
 Platelet count 51 today on CBC. Dr Charlynn Grimes and Dr. Derrell Lolling aware. Pt ok for surgery today.

## 2023-09-01 NOTE — Discharge Instructions (Signed)

## 2023-09-01 NOTE — Anesthesia Postprocedure Evaluation (Signed)
 Anesthesia Post Note  Patient: Luis Morrison  Procedure(s) Performed: LAPAROSCOPIC RIGHT INGUINAL HERNIA REPAIR WITH MESH (Right: Abdomen) INSERTION OF MESH (Right: Abdomen)     Patient location during evaluation: PACU Anesthesia Type: General Level of consciousness: awake and alert Pain management: pain level controlled Vital Signs Assessment: post-procedure vital signs reviewed and stable Respiratory status: spontaneous breathing, nonlabored ventilation, respiratory function stable and patient connected to nasal cannula oxygen Cardiovascular status: blood pressure returned to baseline and stable Postop Assessment: no apparent nausea or vomiting Anesthetic complications: no   No notable events documented.  Last Vitals:  Vitals:   09/01/23 1415 09/01/23 1430  BP: 137/77 (!) 143/84  Pulse: 60 61  Resp: 12 12  Temp:  36.6 C  SpO2: 97% 100%    Last Pain:  Vitals:   09/01/23 1439  TempSrc:   PainSc: 4                  [place]

## 2023-09-01 NOTE — Transfer of Care (Signed)
 Immediate Anesthesia Transfer of Care Note  Patient: Luis Morrison  Procedure(s) Performed: LAPAROSCOPIC RIGHT INGUINAL HERNIA REPAIR WITH MESH (Right: Abdomen) INSERTION OF MESH (Right: Abdomen)  Patient Location: PACU  Anesthesia Type:General  Level of Consciousness: awake, alert , and patient cooperative  Airway & Oxygen Therapy: Patient Spontanous Breathing and Patient connected to face mask oxygen  Post-op Assessment: Report given to RN and Post -op Vital signs reviewed and stable  Post vital signs: Reviewed and stable  Last Vitals:  Vitals Value Taken Time  BP 128/77 09/01/23 1355  Temp    Pulse 70 09/01/23 1359  Resp 17 09/01/23 1359  SpO2 100 % 09/01/23 1359  Vitals shown include unfiled device data.  Last Pain:  Vitals:   09/01/23 1132  TempSrc:   PainSc: 3          Complications: No notable events documented.

## 2023-09-01 NOTE — H&P (Signed)
 Chief Complaint: New Consultation and Inguinal Hernia       History of Present Illness: Luis Morrison is a 68 y.o. male who is seen today as an office consultation at the request of Dr. Laural Benes for evaluation of New Consultation and Inguinal Hernia .   Patient is a 68 year old male who comes in secondary to a right inguinal hernia.  Patient has a history of cirrhosis, alcoholism.   Patient states that the hernias been there for the last 2 weeks.  He was recently seen in the ER secondary to pain and discomfort.  Patient states he is able to reduce the hernia also but however he does get larger after being on his feet.  He states that he had no signs or symptoms of incarceration or strangulation.   Patient does appear to have a history of hepatitis C.   Patient denies any paracentesis needed from his abdomen.         Review of Systems: A complete review of systems was obtained from the patient.  I have reviewed this information and discussed as appropriate with the patient.  See HPI as well for other ROS.   Review of Systems  Constitutional:  Negative for fever.  HENT:  Negative for congestion.   Eyes:  Negative for blurred vision.  Respiratory:  Negative for cough, shortness of breath and wheezing.   Cardiovascular:  Negative for chest pain and palpitations.  Gastrointestinal:  Negative for heartburn.  Genitourinary:  Negative for dysuria.  Musculoskeletal:  Negative for myalgias.  Skin:  Negative for rash.  Neurological:  Negative for dizziness and headaches.  Psychiatric/Behavioral:  Negative for depression and suicidal ideas.   All other systems reviewed and are negative.       Medical History: History reviewed. No pertinent past medical history.   There is no problem list on file for this patient.     History reviewed. No pertinent surgical history.    No Known Allergies         Current Outpatient Medications on File Prior to Visit  Medication Sig Dispense Refill    nadoloL (CORGARD) 20 MG tablet Take 10 mg by mouth once daily       oxyCODONE-acetaminophen (PERCOCET) 5-325 mg tablet Take 1 tablet by mouth        No current facility-administered medications on file prior to visit.      History reviewed. No pertinent family history.    Social History       Tobacco Use  Smoking Status Never  Smokeless Tobacco Never      Social History        Socioeconomic History   Marital status: Single  Tobacco Use   Smoking status: Never   Smokeless tobacco: Never  Substance and Sexual Activity   Alcohol use: Not Currently   Drug use: Never    Social Drivers of Health        Financial Resource Strain: Low Risk  (10/15/2022)    Received from [facility]    Overall Financial Resource Strain (CARDIA)     Difficulty of Paying Living Expenses: Not hard at all  Food Insecurity: No Food Insecurity (10/15/2022)    Received from [facility]    Hunger Vital Sign     Worried About Running Out of Food in the Last Year: Never true     Ran Out of Food in the Last Year: Never true  Transportation Needs: No Transportation Needs (10/15/2022)    Received from [facility]  PRAPARE - Therapist, art (Medical): No     Lack of Transportation (Non-Medical): No  Physical Activity: Sufficiently Active (10/15/2022)    Received from [facility]    Exercise Vital Sign     Days of Exercise per Week: 5 days     Minutes of Exercise per Session: 30 min  Stress: No Stress Concern Present (10/15/2022)    Received from [facility] - Occupational Stress Questionnaire     Feeling of Stress : Not at all  Social Connections: Moderately Isolated (10/15/2022)    Received from [facility]    Social Connection and Isolation Panel [NHANES]     Frequency of Communication with Friends and Family: More than three times a week     Frequency of Social Gatherings with Friends and Family: Three times a week     Attends  Religious Services: 1 to 4 times per year     Active Member of Clubs or Organizations: No     Attends Banker Meetings: Never     Marital Status: Never married      Objective:         Body mass index is 29.24 kg/m. Physical Exam Constitutional:      Appearance: Normal appearance.  HENT:     Head: Normocephalic and atraumatic.     Nose: Nose normal. No congestion.     Mouth/Throat:     Mouth: Mucous membranes are moist.     Pharynx: Oropharynx is clear.  Eyes:     Pupils: Pupils are equal, round, and reactive to light.  Cardiovascular:     Rate and Rhythm: Normal rate and regular rhythm.     Pulses: Normal pulses.     Heart sounds: Normal heart sounds. No murmur heard.    No friction rub. No gallop.  Pulmonary:     Effort: Pulmonary effort is normal. No respiratory distress.     Breath sounds: Normal breath sounds. No stridor. No wheezing, rhonchi or rales.  Abdominal:     General: Abdomen is flat.     Hernia: A hernia is present. Hernia is present in the left inguinal area and right inguinal area.  Musculoskeletal:        General: Normal range of motion.     Cervical back: Normal range of motion.  Skin:    General: Skin is warm and dry.  Neurological:     General: No focal deficit present.     Mental Status: He is alert and oriented to person, place, and time.  Psychiatric:        Mood and Affect: Mood normal.        Thought Content: Thought content normal.          Assessment and Plan:  Diagnoses and all orders for this visit:   Unilateral inguinal hernia without obstruction or gangrene, recurrence not specified     Luis Morrison is a 68 y.o. male     We will proceed to the OR for a laparoscopic right inguinal hernia repair with mesh. All risks and benefits were discussed with the patient, to generally include infection, bleeding, damage to surrounding structures, acute and chronic nerve pain, and recurrence. Alternatives were offered and  described.  All questions were answered and the patient voiced understanding of the procedure and wishes to proceed at this point.             No  follow-ups on file.   Axel Filler, MD, [facility] Surgery, [place] [facility]

## 2023-09-01 NOTE — Anesthesia Preprocedure Evaluation (Addendum)
 Anesthesia Evaluation  Patient identified by MRN, date of birth, ID band Patient awake    Reviewed: Allergy & Precautions, H&P , NPO status , Patient's Chart, lab work & pertinent test results  Airway Mallampati: II  TM Distance: >3 FB Neck ROM: Full    Dental  (+) Edentulous Upper, Edentulous Lower   Pulmonary neg pulmonary ROS   Pulmonary exam normal breath sounds clear to auscultation       Cardiovascular negative cardio ROS Normal cardiovascular exam Rhythm:Regular Rate:Normal     Neuro/Psych negative neurological ROS  negative psych ROS   GI/Hepatic ,,,(+) Cirrhosis   Esophageal Varices    , Hepatitis -, CAlcohol Abuse Hx of esophageal banding.    Endo/Other  negative endocrine ROS    Renal/GU negative Renal ROS  negative genitourinary   Musculoskeletal negative musculoskeletal ROS (+)    Abdominal   Peds negative pediatric ROS (+)  Hematology negative hematology ROS (+)   Anesthesia Other Findings Plt 49. Chronic thrombocytopenia  Reproductive/Obstetrics negative OB ROS                             Anesthesia Physical Anesthesia Plan  ASA: 3  Anesthesia Plan: General   Post-op Pain Management:    Induction: Intravenous  PONV Risk Score and Plan: 2 and Ondansetron and Dexamethasone  Airway Management Planned: Oral ETT  Additional Equipment: None  Intra-op Plan:   Post-operative Plan: Extubation in OR  Informed Consent: I have reviewed the patients History and Physical, chart, labs and discussed the procedure including the risks, benefits and alternatives for the proposed anesthesia with the patient or authorized representative who has indicated his/her understanding and acceptance.     Dental advisory given  Plan Discussed with: CRNA  Anesthesia Plan Comments:         Anesthesia Quick Evaluation

## 2023-09-01 NOTE — Op Note (Signed)
 09/01/2023  1:38 PM  PATIENT:  [name]  68 y.o. male  PRE-OPERATIVE DIAGNOSIS:  right inguinal hernia  POST-OPERATIVE DIAGNOSIS:  right indirect inguinal hernia  PROCEDURE:  Procedure(s): LAPAROSCOPIC RIGHT INGUINAL HERNIA REPAIR WITH MESH (Right) INSERTION OF MESH (Right)  SURGEON:  Surgeons and Role:    Axel Filler, MD - Primary  ASSISTANTS: Jeronimo Greaves, RNFA   ANESTHESIA:   local and general  EBL:  minimal   BLOOD ADMINISTERED:none  DRAINS: none   LOCAL MEDICATIONS USED:  BUPIVICAINE   SPECIMEN:  No Specimen  DISPOSITION OF SPECIMEN:  N/A  COUNTS:  YES  TOURNIQUET:  * No tourniquets in log *  DICTATION: .Dragon Dictation  Counts: reported as correct x 2  Findings:  The patient had a medium right indirect hernia  Indications for procedure:  The patient is a 68 year old male with a right inguinal hernia for several months. Patient complained of symptomatology to his right inguinal area. The patient was taken back for elective inguinal hernia repair.  Details of the procedure: The patient was taken back to the operating room. The patient was placed in supine position with bilateral SCDs in place.  The patient was prepped and draped in the usual sterile fashion.  After appropriate anitbiotics were confirmed, a time-out was confirmed and all facts were verified.  0.25% Marcaine was used to infiltrate the umbilical area. A 11-blade was used to cut down the skin and blunt dissection was used to get the anterior fashion.  The anterior fascia was incised approximately 1 cm and the muscles were retracted laterally. Blunt dissection was then used to create a space in the preperitoneal area. At this time a 10 mm camera was then introduced into the space and advanced the pubic tubercle and a 12 mm trocar was placed over this and insufflation was started.  At this time and space was created from medial to laterally the preperitoneal space.  Cooper's ligament was  initially cleaned off.  The hernia sac was identified in the indirect space. Dissection of the hernia sac and cord structures was undertaken the vas deferens was identified and protected in all parts of the case.    Once the hernia sac was taken down to approximately the umbilicus a Bard 3D Max mesh, size: Barney Drain, was  introduced into the preperitoneal space.  The mesh was brought over to cover the direct and indirect hernia spaces.  This was anchored into place and secured to Cooper's ligament with 4.43mm staples from a [place] hernia stapler. It was anchored to the anterior abdominal wall with 4.8 mm staples. The hernia sac was seen lying posterior to the mesh. There was no staples placed laterally. The insufflation was evacuated and the peritoneum was seen posterior to the mesh. The trochars were removed. The anterior fascia was reapproximated using #1 Vicryl on a UR- 6.  Intra-abdominal air was evacuated and the Veress needle removed. The skin was reapproximated using 4-0 Monocryl subcuticular fashion and Dermabond. The patient was awakened from general anesthesia and taken to recovery in stable condition.   PLAN OF CARE: Discharge to home after PACU  PATIENT DISPOSITION:  PACU - hemodynamically stable.   Delay start of Pharmacological VTE agent (>24hrs) due to surgical blood loss or risk of bleeding: not applicable

## 2023-09-02 ENCOUNTER — Encounter (HOSPITAL_COMMUNITY): Payer: Self-pay | Admitting: General Surgery

## 2023-09-04 ENCOUNTER — Emergency Department (HOSPITAL_COMMUNITY)
Admission: EM | Admit: 2023-09-04 | Discharge: 2023-09-05 | Disposition: A | Discharge status: Home / Self Care | Attending: Emergency Medicine | Admitting: Emergency Medicine

## 2023-09-04 ENCOUNTER — Emergency Department (HOSPITAL_COMMUNITY)

## 2023-09-04 ENCOUNTER — Encounter (HOSPITAL_COMMUNITY): Payer: Self-pay | Admitting: General Surgery

## 2023-09-04 ENCOUNTER — Other Ambulatory Visit: Payer: Self-pay

## 2023-09-04 DIAGNOSIS — R161 Splenomegaly, not elsewhere classified: Secondary | ICD-10-CM | POA: Diagnosis not present

## 2023-09-04 DIAGNOSIS — N492 Inflammatory disorders of scrotum: Secondary | ICD-10-CM | POA: Diagnosis not present

## 2023-09-04 DIAGNOSIS — R1030 Lower abdominal pain, unspecified: Secondary | ICD-10-CM | POA: Diagnosis present

## 2023-09-04 DIAGNOSIS — K7460 Unspecified cirrhosis of liver: Secondary | ICD-10-CM | POA: Diagnosis not present

## 2023-09-04 DIAGNOSIS — N5089 Other specified disorders of the male genital organs: Secondary | ICD-10-CM

## 2023-09-04 LAB — COMPREHENSIVE METABOLIC PANEL WITH GFR
ALT: 28 U/L (ref 0–44)
AST: 48 U/L — ABNORMAL HIGH (ref 15–41)
Albumin: 3 g/dL — ABNORMAL LOW (ref 3.5–5.0)
Alkaline Phosphatase: 142 U/L — ABNORMAL HIGH (ref 38–126)
Anion gap: 8 (ref 5–15)
BUN: 7 mg/dL — ABNORMAL LOW (ref 8–23)
CO2: 23 mmol/L (ref 22–32)
Calcium: 8.6 mg/dL — ABNORMAL LOW (ref 8.9–10.3)
Chloride: 104 mmol/L (ref 98–111)
Creatinine, Ser: 0.89 mg/dL (ref 0.61–1.24)
GFR, Estimated: >60 mL/min (ref >60)
Glucose, Bld: 136 mg/dL — ABNORMAL HIGH (ref 70–99)
Potassium: 3.4 mmol/L — ABNORMAL LOW (ref 3.5–5.1)
Sodium: 135 mmol/L (ref 135–145)
Total Bilirubin: 1.1 mg/dL (ref 0.0–1.2)
Total Protein: 7.8 g/dL (ref 6.5–8.1)

## 2023-09-04 LAB — URINALYSIS, ROUTINE W REFLEX MICROSCOPIC
Bilirubin Urine: NEGATIVE
Glucose, UA: NEGATIVE mg/dL
Hgb urine dipstick: NEGATIVE
Ketones, ur: NEGATIVE mg/dL
Leukocytes,Ua: NEGATIVE
Nitrite: NEGATIVE
Protein, ur: NEGATIVE mg/dL
Specific Gravity, Urine: 1.008 (ref 1.005–1.030)
pH: 6 (ref 5.0–8.0)

## 2023-09-04 LAB — CBC
HCT: 43.5 % (ref 39.0–52.0)
Hemoglobin: 14 g/dL (ref 13.0–17.0)
MCH: 28.1 pg (ref 26.0–34.0)
MCHC: 32.2 g/dL (ref 30.0–36.0)
MCV: 87.2 fL (ref 80.0–100.0)
Platelets: 59 10*3/uL — ABNORMAL LOW (ref 150–400)
RBC: 4.99 MIL/uL (ref 4.22–5.81)
RDW: 15.7 % — ABNORMAL HIGH (ref 11.5–15.5)
WBC: 5.4 10*3/uL (ref 4.0–10.5)
nRBC: 0 % (ref 0.0–0.2)

## 2023-09-04 MED ORDER — MORPHINE SULFATE (PF) 4 MG/ML IV SOLN
4.0000 mg | Freq: Once | INTRAVENOUS | Status: AC
  Administered 2023-09-04: 4 mg via INTRAVENOUS
  Filled 2023-09-04: qty 1

## 2023-09-04 MED ORDER — IOHEXOL 350 MG/ML SOLN
75.0000 mL | Freq: Once | INTRAVENOUS | Status: AC | PRN
Start: 2023-09-04 — End: 2023-09-04
  Administered 2023-09-04: 75 mL via INTRAVENOUS

## 2023-09-04 MED ORDER — FENTANYL CITRATE PF 50 MCG/ML IJ SOSY
50.0000 ug | PREFILLED_SYRINGE | Freq: Once | INTRAMUSCULAR | Status: AC
  Administered 2023-09-04: 50 ug via INTRAVENOUS
  Filled 2023-09-04: qty 1

## 2023-09-04 MED ORDER — PIPERACILLIN-TAZOBACTAM 3.375 G IVPB 30 MIN
3.3750 g | Freq: Once | INTRAVENOUS | Status: AC
  Administered 2023-09-04: 3.375 g via INTRAVENOUS
  Filled 2023-09-04: qty 50

## 2023-09-04 NOTE — ED Notes (Signed)
 Pt currently in [place].

## 2023-09-04 NOTE — ED Provider Notes (Signed)
 [place] EMERGENCY DEPARTMENT AT [facility] Provider Note   CSN: [id number] Arrival date & time: 09/04/23  1922     History  Chief Complaint  Patient presents with   Post-op Problem   Groin Swelling    Luis TRAUM is a 68 y.o. male, history of cirrhosis, who presents to the ED secondary to postoperative complication, after having a right inguinal hernia surgery, on 3/24.  He states since then, his testicles have gotten extremely swollen, and that his groin is very tender.  Also the site has become red, and hot.  Denies any fevers or chills.  States he put ice on it like the surgeons that after the surgery, but the area has ballooned.  He states the pain is a 7 out of 10.  Denies any penile discharge, but states he is having hard time urinating, because his testicles are so swollen, and his groin is so swollen.     Home Medications Prior to Admission medications   Medication Sig Start Date End Date Taking? Authorizing Provider  acetaminophen (TYLENOL) 500 MG tablet Take 1,000 mg by mouth every 6 (six) hours as needed for mild pain (pain score 1-3).    [provider]  dorzolamide-timolol (COSOPT) 2-0.5 % ophthalmic solution Place 1 drop into both eyes 2 (two) times daily. 05/19/23   [provider]  latanoprost (XALATAN) 0.005 % ophthalmic solution Place 1 drop into both eyes at bedtime. 06/21/23   [provider]  nadolol (CORGARD) 20 MG tablet Take 0.5 tablets (10 mg total) by mouth daily. 02/20/23   Marcine Matar, MD  Naphazoline HCl (CLEAR EYES OP) Place 1 drop into both eyes daily as needed (redness).    [provider]  sildenafil (VIAGRA) 50 MG tablet TAKE 1 TABLET BY MOUTH AS NEEDED HALF  HOUR  PRIOR  TO  INTERCOURSE.  LIMIT  USE  TO  1  TAB  EVERY  24  HOURS 03/10/23   Marcine Matar, MD  traMADol (ULTRAM) 50 MG tablet Take 1 tablet (50 mg total) by mouth every 6 (six) hours as needed. 09/01/23 08/31/24  Axel Filler, MD       Allergies    Patient has no known allergies.    Review of Systems   Review of Systems  Constitutional:  Negative for fever.  Skin:  Positive for color change.    Physical Exam Updated Vital Signs BP 139/87   Pulse 76   Temp 98.1 F (36.7 C) (Oral)   Resp 18   SpO2 100%  Physical Exam Vitals and nursing note reviewed. Exam conducted with a chaperone present.  Constitutional:      General: He is not in acute distress.    Appearance: He is well-developed.  HENT:     Head: Normocephalic and atraumatic.  Eyes:     Conjunctiva/sclera: Conjunctivae normal.  Cardiovascular:     Rate and Rhythm: Normal rate and regular rhythm.     Heart sounds: No murmur heard. Pulmonary:     Effort: Pulmonary effort is normal. No respiratory distress.     Breath sounds: Normal breath sounds.  Abdominal:     Palpations: Abdomen is soft.     Tenderness: There is no abdominal tenderness.  Genitourinary:    Comments: Edematous and erythematous, testicles, and erythema to the suprapubic site.  Tenderness to palpation, around right inguinal canal. Musculoskeletal:        General: No swelling.     Cervical back: Neck  supple.  Skin:    General: Skin is warm and dry.     Capillary Refill: Capillary refill takes less than 2 seconds.  Neurological:     Mental Status: He is alert.  Psychiatric:        Mood and Affect: Mood normal.     ED Results / Procedures / Treatments   Labs (all labs ordered are listed, but only abnormal results are displayed) Labs Reviewed  COMPREHENSIVE METABOLIC PANEL WITH GFR - Abnormal; Notable for the following components:      Result Value   Potassium 3.4 (*)    Glucose, Bld 136 (*)    BUN 7 (*)    Calcium 8.6 (*)    Albumin 3.0 (*)    AST 48 (*)    Alkaline Phosphatase 142 (*)    All other components within normal limits  CBC - Abnormal; Notable for the following components:   RDW 15.7 (*)    Platelets 59 (*)    All other components within normal  limits  URINALYSIS, ROUTINE W REFLEX MICROSCOPIC    EKG None  Radiology CT ABDOMEN PELVIS W CONTRAST Result Date: 09/04/2023 CLINICAL DATA:  Hernia repair on 03/24. Postoperative suprapubic pain and swelling. EXAM: CT ABDOMEN AND PELVIS WITH CONTRAST TECHNIQUE: Multidetector CT imaging of the abdomen and pelvis was performed using the standard protocol following bolus administration of intravenous contrast. RADIATION DOSE REDUCTION: This exam was performed according to the departmental dose-optimization program which includes automated exposure control, adjustment of the mA and/or kV according to patient size and/or use of iterative reconstruction technique. CONTRAST:  75mL OMNIPAQUE IOHEXOL 350 MG/ML SOLN COMPARISON:  CT abdomen and pelvis 09/09/2014 FINDINGS: Lower chest: No acute abnormality. Hepatobiliary: There is diffuse nodular liver contour and heterogeneity compatible with cirrhosis. Gallbladder and bile ducts are within normal limits. Pancreas: Unremarkable. No pancreatic ductal dilatation or surrounding inflammatory changes. Spleen: The spleen is mildly enlarged. Adrenals/Urinary Tract: Adrenal glands are unremarkable. Kidneys are normal, without renal calculi, focal lesion, or hydronephrosis. Bladder is unremarkable. Stomach/Bowel: There is wall thickening of the ascending colon versus normal under distension. There is no surrounding inflammation. No dilated bowel loops are seen. The appendix, Magaret Justo bowel and stomach are within normal limits. Vascular/Lymphatic: There is questionable superior mesenteric vein thrombosis seen on axial image 3/34 and 8/17. There some surrounding inflammatory stranding. Splenic vein and portal veins appear patent. Aorta and IVC are normal in size. No enlarged lymph nodes are identified. Reproductive: Prostate gland is slightly prominent in size. There is marked diffuse scrotal wall edema. Indi Willhite right hydrocele present. There is also edema of the penis and in the  mons pubis as well as throughout the right inguinal canal. Air seen within the bilateral inguinal canals, mons pubis and in the soft tissues of the right hemiscrotum. No recurrent hernia identified. Other: There is air seen tracking throughout the lower chest and abdominal wall likely related to recent surgery. There is a Dejana Pugsley amount of free fluid throughout the abdomen and pelvis. Musculoskeletal: No acute or significant osseous findings. IMPRESSION: 1. Marked diffuse scrotal wall and penile edema with Jakhai Fant right hydrocele. Edema in mons pubis as well as throughout the right inguinal canal. Air seen within the bilateral inguinal canals, mons pubis and in the soft tissues of the right hemiscrotum. Findings may be related to recent surgery. Superimposed infection not excluded. 2. 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 This can be further evaluated with Doppler ultrasound. 3. Cirrhosis of the liver with splenomegaly and Dalexa Gentz  amount of ascites. 4. Wall thickening of the ascending colon versus normal under distension. Correlate clinically for colitis. 5. Air seen tracking throughout the lower chest and abdominal wall likely related to recent surgery. Electronically Signed   By: Darliss Cheney M.D.   On: 09/04/2023 23:28   US SCROTUM W/DOPPLER Result Date: 09/04/2023 CLINICAL DATA:  Status post hernia repair on September 01, 2023, presenting with testicular pain. EXAM: SCROTAL ULTRASOUND DOPPLER ULTRASOUND OF THE TESTICLES TECHNIQUE: Complete ultrasound examination of the testicles, epididymis, and other scrotal structures was performed. Color and spectral Doppler ultrasound were also utilized to evaluate blood flow to the testicles. COMPARISON:  None Available. FINDINGS: Right testicle Measurements: 4.8 cm x 3.2 cm x 3.5 cm. No mass or microlithiasis visualized. Left testicle Measurements: 3.7 cm x 1.6 cm x 2.6 cm. No mass or microlithiasis visualized. Right epididymis: A 1.1 cm x 0.8 cm x 0.9 cm right  epididymal head cyst is noted. Left epididymis: A 1.5 cm x 1.0 cm x 1.2 cm left epididymal head cyst is seen. Hydrocele:  There is a Anvay Tennis right-sided hydrocele. Varicocele:  None visualized. Pulsed Doppler interrogation of both testes demonstrates normal low resistance arterial and venous waveforms bilaterally. Other: Moderate severity scrotal wall thickening and edema is seen. IMPRESSION: 1. Moderate severity scrotal wall thickening and edema. 2. Bilateral epididymal cysts. 3. Brayon Bielefeld right-sided hydrocele. Electronically Signed   By: Aram Candela M.D.   On: 09/04/2023 21:21    Procedures Procedures    Medications Ordered in ED Medications  piperacillin-tazobactam (ZOSYN) IVPB 3.375 g (3.375 g Intravenous New Bag/Given 09/04/23 2345)  morphine (PF) 4 MG/ML injection 4 mg (4 mg Intravenous Given 09/04/23 2223)  iohexol (OMNIPAQUE) 350 MG/ML injection 75 mL (75 mLs Intravenous Contrast Given 09/04/23 2301)  fentaNYL (SUBLIMAZE) injection 50 mcg (50 mcg Intravenous Given 09/04/23 2345)    ED Course/ Medical Decision Making/ A&P                                 Medical Decision Making Patient is a 68 year old male, here for swelling of the scrotum, this been going on for the last couple days, got progressively worse, after having a right inguinal hernia repair, on 3/24.  He is overall well-appearing, but has enlarged scrotum, with overlying erythema.  We will obtain a CT abdomen pelvis, for further evaluation as well as blood work.  He is afebrile.  Morphine ordered for pain.  Amount and/or Complexity of Data Reviewed Labs: ordered.    Details: Unremarkable labs Radiology: ordered.    Details: CT abd pelvis shows marked diffuse scrotal wall and penile edema, with Lenore Moyano right hydrocele.  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  I spoke with Dr. Azucena Cecil, and she will evaluate the patient at bedside, handed off to [facility], [place], to follow-up on Dr. Louie Boston recommendations, on the possible superior mesenteric thrombosis, as well as to be scrotal wall and penile edema.  Patient is currently getting Zosyn for overlying infection.  He has no crepitus on exam, and is overall well  Risk Prescription drug management.    Final Clinical Impression(s) / ED Diagnoses Final diagnoses:  Scrotal infection    Rx / DC Orders ED Discharge  Orders     None         Taje Tondreau, [place], [place] 09/04/23 2349    Linwood Dibbles, MD 09/05/23 1329

## 2023-09-04 NOTE — ED Triage Notes (Signed)
 Pt reports having a hernia repair on 3/24. Is concerned today d/t enlargement of hernia that has worsened causing him difficulty to void. Report 7/10 pain.

## 2023-09-04 NOTE — ED Provider Notes (Incomplete)
  Accepted handoff at shift change from *** PA-C. Please see prior provider note for more detail.   Briefly: Patient is 68 y.o.   DDX: concern for ***  Plan:  - waiting for Dr Azucena Cecil consult note.

## 2023-09-04 NOTE — ED Provider Notes (Signed)
  Accepted handoff at shift change from brook small PA-C. Please see prior provider note for more detail.   Briefly: Patient is 68 y.o. "presents to the ED secondary to postoperative complication, after having a right inguinal hernia surgery, on 3/24. He states since then, his testicles have gotten extremely swollen, and that his groin is very tender. Also the site has become red, and hot. Denies any fevers or chills. States he put ice on it like the surgeons that after the surgery, but the area has ballooned. He states the pain is a 7 out of 10. Denies any penile discharge, but states he is having hard time urinating, because his testicles are so swollen, and his groin is so swollen."   Plan:  - waiting for on-call general surgeon Dr Azucena Cecil consult note. - Dr. Azucena Cecil recommends pain management, ABX, and outpatient follow up with Dr. Jacinto Halim office today (09/05/2023). Patient understands that he will need to call Dr. Jacinto Halim office first thing in the morning for an appointment. - CT scan showing possible superficial mesenteric vein thrombosis - physical exam is not concerning for this. Dr. Azucena Cecil is less worried about this diagnosis. Radiologist requesting doppler US to further evaluate. Unfortunately, no VAS [place] techs at night. Patient will be boarding in the ED tonight to receive [place] after 7AM. Patient aware of plan.   6:30AM Care of [name] transferred to PA Geiple at the end of my shift as the patient will require reassessment once labs/imaging have resulted. Patient presentation, ED course, and plan of care discussed with review of all pertinent labs and imaging. Please see his/her note for further details regarding further ED course and disposition. Plan at time of handoff is reassess patient after VAS [place]. I believe patient may be eligible for outpatient follow up after imaging results. This may be altered or completely changed at the discretion of the oncoming team pending results of further  workup.        Dorthy Cooler, [place] 09/05/23 1610    Linwood Dibbles, MD 09/05/23 [phone]

## 2023-09-04 NOTE — ED Provider Triage Note (Signed)
 Emergency Medicine Provider Triage Evaluation Note  [name] , a 68 y.o. male  was evaluated in triage.  Pt complains of hernia repair on 3/24 now with severe testicle swelling, pain. Called surgeons office for follow up and they reported to ice it, no other recommendations.  Review of Systems  Positive: Testicle swelling, pain, difficulty urinating Negative:   Physical Exam  BP (!) 151/88 (BP Location: Left Arm)   Pulse 79   Temp 98.1 F (36.7 C) (Oral)   Resp 18   SpO2 98%  Gen:   Awake, no distress   Resp:  Normal effort  MSK:   Moves extremities without difficulty  Other:  Significant swelling of penis and testes, seemingly fluid filled, no unilateral swelling,   Medical Decision Making  Medically screening exam initiated at 8:01 PM.  Appropriate orders placed.  [name] was informed that the remainder of the evaluation will be completed by another provider, this initial triage assessment does not replace that evaluation, and the importance of remaining in the ED until their evaluation is complete.  Workup initiated in triage    [place] 09/04/23 2003

## 2023-09-04 NOTE — ED Notes (Signed)
 Assumed pt care from [facility]

## 2023-09-05 ENCOUNTER — Emergency Department (HOSPITAL_BASED_OUTPATIENT_CLINIC_OR_DEPARTMENT_OTHER)

## 2023-09-05 DIAGNOSIS — K551 Chronic vascular disorders of intestine: Secondary | ICD-10-CM | POA: Diagnosis not present

## 2023-09-05 DIAGNOSIS — N492 Inflammatory disorders of scrotum: Secondary | ICD-10-CM | POA: Diagnosis not present

## 2023-09-05 MED ORDER — DOXYCYCLINE HYCLATE 100 MG PO CAPS: 100.0000 mg | ORAL_CAPSULE | Freq: Two times a day (BID) | ORAL | 0 refills | Status: AC

## 2023-09-05 NOTE — Progress Notes (Signed)
 Patient ID: Luis Morrison, male   DOB: Feb 05, 1956, 68 y.o.   MRN: [id number]  Pt with edema 2/2 to surgery No real signs of infections  D/w pt that he should use compression short or tight fitting underwear to help with reabsoption of fluid  Elev scrotum at home  OK for PO from my standpoint.  Will have him f/u in 1 week in my clinic

## 2023-09-05 NOTE — ED Notes (Signed)
 Patient transported to vascular.

## 2023-09-05 NOTE — Discharge Instructions (Addendum)
 As discussed, please immediately call Dr. Derrell Lolling for an appointment today.

## 2023-09-05 NOTE — ED Provider Notes (Signed)
 Signout from [employer] at shift change. Briefly, patient presents for swelling and pain after hernia surgery on 3/24.  He had CT imaging which appeared to show expected postsurgical changing however there was a question of superficial mesenteric vein thrombosis.  For this reason, patient is being held for ultrasound this morning.  Patient should have outpatient follow-up with surgical clinic later today.   Plan: Mesenteric ultrasound to evaluate for possible mesenteric vein thrombosis  //   9:49 AM Reassessment performed. Patient appears   Labs and imaging personally reviewed and interpreted including: CBC with low platelets of 59 otherwise unremarkable; CMP with normal renal function, slightly elevated AST and alkaline phosphatase, slightly low potassium at 3.4; UA unremarkable.  Reviewed results of mesenteric vascular ultrasound: Summary:  Mesenteric: The superior mesenteric vein, portal vein and splenic vein all appear patent.    Reviewed additional pertinent lab work and imaging with patient at bedside.   Most current vital signs reviewed and are as follows: BP 127/79   Pulse 71   Temp 98.2 F (36.8 C)   Resp 18   SpO2 99%   Plan: Discharge, surgery follow-up, doxycycline   Home treatment: Continue  postsurgical recommendations       Renne Crigler, PA-C 09/05/23 [id number]    Lonell Grandchild, MD 09/06/23 1816

## 2023-09-05 NOTE — Progress Notes (Signed)
 Mesenteric vein study  has been completed. Refer to [facility] under chart review to view preliminary results.   09/05/2023  9:37 AM Luis Morrison, [name]

## 2023-09-05 NOTE — Consult Note (Signed)
 Reason for Consult:  Scrotal pain and edema Referring Provider: Small, PA  HPI  Luis Morrison is an 68 y.o. male with history of laparoscopic right inguinal hernia repair with mesh who presents today with worsening scrotal edema and pain.  Patient states he was instructed to elevate scrotum post op due to risks of edema , ecchymosis and pain. He did not do this initially but 2-3 days after surgery (operation on 3/24), he began to experience worsening pain, swelling, and difficulty with urination do to swelling.  Patient has normal white count, normal UA. Imaging shows diffuse scrotal wall and penile edema and right hydrocele and edema of mons pubis. Air within inguinal canals likely post operative in nature. He states he can still urinate it just sprays because of the edema. No fevers or chills. He is having expected post-op abdominal soreness from surgical incisions.  10 point review of systems is negative except as listed above in HPI.  Objective  Past Medical History: Past Medical History:  Diagnosis Date   Cirrhosis (HCC) 10/09/2014   Hepatitis    Hep C- treated with Harvoni   History of esophageal varices    2023    Past Surgical History: Past Surgical History:  Procedure Laterality Date   COLONOSCOPY     ESOPHAGEAL BANDING  11/29/2021   Procedure: ESOPHAGEAL BANDING;  Surgeon: Imogene Burn, MD;  Location: [facility] ENDOSCOPY;  Service: Gastroenterology;;   ESOPHAGOGASTRODUODENOSCOPY (EGD) WITH PROPOFOL N/A 11/29/2021   Procedure: ESOPHAGOGASTRODUODENOSCOPY (EGD) WITH PROPOFOL;  Surgeon: Imogene Burn, MD;  Location: [facility] ENDOSCOPY;  Service: Gastroenterology;  Laterality: N/A;   FRACTURE SURGERY Right    Ankle screw placed   INGUINAL HERNIA REPAIR Right 09/01/2023   Procedure: LAPAROSCOPIC RIGHT INGUINAL HERNIA REPAIR WITH MESH;  Surgeon: Axel Filler, MD;  Location: [facility] OR;  Service: General;  Laterality: Right;   INSERTION OF MESH Right 09/01/2023   Procedure: INSERTION OF  MESH;  Surgeon: Axel Filler, MD;  Location: [facility] OR;  Service: General;  Laterality: Right;    Family History:  Family History  Problem Relation Age of Onset   Cancer Mother    Brain cancer Mother    Colon cancer Neg Hx    Colon polyps Neg Hx    Esophageal cancer Neg Hx    Rectal cancer Neg Hx    Stomach cancer Neg Hx     Social History:  reports that he has never smoked. He has never used smokeless tobacco. He reports current alcohol use of about 4.0 standard drinks of alcohol per week. He reports that he does not use drugs.  Allergies: No Known Allergies  Medications: I have reviewed the patient's current medications.  Labs: I have personally reviewed all labs for the past 24h  Imaging: I have personally reviewed and interpreted all imaging for the past 24h and agree with the radiologist's impression.  CT ABDOMEN PELVIS W CONTRAST Result Date: 09/04/2023 CLINICAL DATA:  Hernia repair on 03/24. Postoperative suprapubic pain and swelling. EXAM: CT ABDOMEN AND PELVIS WITH CONTRAST TECHNIQUE: Multidetector CT imaging of the abdomen and pelvis was performed using the standard protocol following bolus administration of intravenous contrast. RADIATION DOSE REDUCTION: This exam was performed according to the departmental dose-optimization program which includes automated exposure control, adjustment of the mA and/or kV according to patient size and/or use of iterative reconstruction technique. CONTRAST:  75mL OMNIPAQUE IOHEXOL 350 MG/ML SOLN COMPARISON:  CT abdomen and pelvis 09/09/2014 FINDINGS: Lower chest: No acute abnormality. Hepatobiliary:  There is diffuse nodular liver contour and heterogeneity compatible with cirrhosis. Gallbladder and bile ducts are within normal limits. Pancreas: Unremarkable. No pancreatic ductal dilatation or surrounding inflammatory changes. Spleen: The spleen is mildly enlarged. Adrenals/Urinary Tract: Adrenal glands are unremarkable. Kidneys are normal, without  renal calculi, focal lesion, or hydronephrosis. Bladder is unremarkable. Stomach/Bowel: There is wall thickening of the ascending colon versus normal under distension. There is no surrounding inflammation. No dilated bowel loops are seen. The appendix, small bowel and stomach are within normal limits. Vascular/Lymphatic: There is questionable superior mesenteric vein thrombosis seen on axial image 3/34 and 8/17. There some surrounding inflammatory stranding. Splenic vein and portal veins appear patent. Aorta and IVC are normal in size. No enlarged lymph nodes are identified. Reproductive: Prostate gland is slightly prominent in size. There is marked diffuse scrotal wall edema. Small right hydrocele present. There is also edema of the penis and in the mons pubis as well as throughout the right inguinal canal. Air seen within the bilateral inguinal canals, mons pubis and in the soft tissues of the right hemiscrotum. No recurrent hernia identified. Other: There is air seen tracking throughout the lower chest and abdominal wall likely related to recent surgery. There is a small amount of free fluid throughout the abdomen and pelvis. Musculoskeletal: No acute or significant osseous findings. IMPRESSION: 1. Marked diffuse scrotal wall and penile edema with small right hydrocele. Edema in mons pubis as well as throughout the right inguinal canal. Air seen within the bilateral inguinal canals, mons pubis and in the soft tissues of the right hemiscrotum. Findings may be related to recent surgery. Superimposed infection not excluded. 2. 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 This can be further evaluated with Doppler ultrasound. 3. Cirrhosis of the liver with splenomegaly and small amount of ascites. 4. Wall thickening of the ascending colon versus normal under distension. Correlate clinically for colitis. 5. Air seen tracking throughout the lower chest and abdominal wall likely related to recent surgery.  Electronically Signed   By: Darliss Cheney M.D.   On: 09/04/2023 23:28   US SCROTUM W/DOPPLER Result Date: 09/04/2023 CLINICAL DATA:  Status post hernia repair on September 01, 2023, presenting with testicular pain. EXAM: SCROTAL ULTRASOUND DOPPLER ULTRASOUND OF THE TESTICLES TECHNIQUE: Complete ultrasound examination of the testicles, epididymis, and other scrotal structures was performed. Color and spectral Doppler ultrasound were also utilized to evaluate blood flow to the testicles. COMPARISON:  None Available. FINDINGS: Right testicle Measurements: 4.8 cm x 3.2 cm x 3.5 cm. No mass or microlithiasis visualized. Left testicle Measurements: 3.7 cm x 1.6 cm x 2.6 cm. No mass or microlithiasis visualized. Right epididymis: A 1.1 cm x 0.8 cm x 0.9 cm right epididymal head cyst is noted. Left epididymis: A 1.5 cm x 1.0 cm x 1.2 cm left epididymal head cyst is seen. Hydrocele:  There is a small right-sided hydrocele. Varicocele:  None visualized. Pulsed Doppler interrogation of both testes demonstrates normal low resistance arterial and venous waveforms bilaterally. Other: Moderate severity scrotal wall thickening and edema is seen. IMPRESSION: 1. Moderate severity scrotal wall thickening and edema. 2. Bilateral epididymal cysts. 3. Small right-sided hydrocele. Electronically Signed   By: Aram Candela M.D.   On: 09/04/2023 21:21     Physical Exam Blood pressure 138/73, pulse 67, temperature 98.2 F (36.8 C), temperature source Oral, resp. rate 18, SpO2 100%. Gen: NAD CV: RRR Pulm: NWOB on room air Abd: Soft, NTND, ecchymosis surrounding surgical sites which are well approximated GU: Significantly swollen  scrotum and penis, tender to palpation, mild erythema. No crepitus    Assessment   TAIDEN RAYBOURN is an 68 y.o. male who is s/p laparoscopic right inguinal hernia repair with mesh  Plan  - Discharge from ED with pain meds and antibiotics for possible cellulitis - Will message Dr. Derrell Lolling as he has  clinic tomorrow and can hopefully fit in to see in AM. Educated patient to also call clinic in AM - Educated patient on elevating scrotum and icing.  I reviewed last 24 h vitals and pain scores, last 48 h intake and output, last 24 h labs and trends, and last 24 h imaging results.  This care required straight-forward level of medical decision making.   Donata Duff, MD [facility]

## 2023-11-11 ENCOUNTER — Ambulatory Visit: Attending: Internal Medicine

## 2023-11-11 VITALS — Ht 70.0 in | Wt 198.0 lb

## 2023-11-11 DIAGNOSIS — Z0000 Encounter for general adult medical examination without abnormal findings: Secondary | ICD-10-CM | POA: Diagnosis not present

## 2023-11-11 NOTE — Progress Notes (Addendum)
 Because this visit was a virtual/telehealth visit,  certain criteria was not obtained, such a blood pressure, CBG if applicable, and timed get up and go. Any medications not marked as "taking" were not mentioned during the medication reconciliation part of the visit. Any vitals not documented were not able to be obtained due to this being a telehealth visit or patient was unable to self-report a recent blood pressure reading due to a lack of equipment at home via telehealth. Vitals that have been documented are verbally provided by the patient.   Subjective:   Luis Morrison is a 68 y.o. who presents for a Medicare Wellness preventive visit.  As a reminder, Annual Wellness Visits don't include a physical exam, and some assessments may be limited, especially if this visit is performed virtually. We may recommend an in-person follow-up visit with your provider if needed.  Visit Complete: Virtual I connected with  Luis Morrison on 11/11/23 by a audio enabled telemedicine application and verified that I am speaking with the correct person using two identifiers.  Patient Location: Home  Provider Location: Office/Clinic  I discussed the limitations of evaluation and management by telemedicine. The patient expressed understanding and agreed to proceed.  Vital Signs: Because this visit was a virtual/telehealth visit, some criteria may be missing or patient reported. Any vitals not documented were not able to be obtained and vitals that have been documented are patient reported.  VideoDeclined- This patient declined Librarian, academic. Therefore the visit was completed with audio only.  Persons Participating in Visit: Patient.  AWV Questionnaire: No: Patient Medicare AWV questionnaire was not completed prior to this visit.  Cardiac Risk Factors include: advanced age (>35men, >21 women);male gender     Objective:     Today's Vitals   11/11/23 1625  Weight: 198 lb  (89.8 kg)  Height: 5\' 10"  (1.778 m)  PainSc: 0-No pain   Body mass index is 28.41 kg/m.     11/11/2023    4:27 PM 09/04/2023    7:41 PM 08/21/2023   11:08 AM 04/29/2023    1:14 PM 04/22/2023    1:44 PM 10/15/2022    1:43 PM 11/29/2021   10:06 AM  Advanced Directives  Does Patient Have a Medical Advance Directive? No No No No No No No  Would patient like information on creating a medical advance directive? No - Patient declined No - Patient declined No - Patient declined   Yes (MAU/Ambulatory/Procedural Areas - Information given)     Current Medications (verified) Outpatient Encounter Medications as of 11/11/2023  Medication Sig   acetaminophen  (TYLENOL ) 500 MG tablet Take 1,000 mg by mouth every 6 (six) hours as needed for mild pain (pain score 1-3).   dorzolamide-timolol (COSOPT) 2-0.5 % ophthalmic solution Place 1 drop into both eyes 2 (two) times daily.   latanoprost (XALATAN) 0.005 % ophthalmic solution Place 1 drop into both eyes at bedtime.   nadolol  (CORGARD ) 20 MG tablet Take 0.5 tablets (10 mg total) by mouth daily.   Naphazoline HCl (CLEAR EYES OP) Place 1 drop into both eyes daily as needed (redness).   sildenafil  (VIAGRA ) 50 MG tablet TAKE 1 TABLET BY MOUTH AS NEEDED HALF  HOUR  PRIOR  TO  INTERCOURSE.  LIMIT  USE  TO  1  TAB  EVERY  24  HOURS (Patient taking differently: Take 50 mg by mouth as needed for erectile dysfunction. TAKE 1 TABLET BY MOUTH AS NEEDED HALF  HOUR  PRIOR  TO  INTERCOURSE.  LIMIT  USE  TO  1  TAB  EVERY  24  HOURS)   traMADol  (ULTRAM ) 50 MG tablet Take 1 tablet (50 mg total) by mouth every 6 (six) hours as needed. (Patient taking differently: Take 50 mg by mouth every 6 (six) hours as needed for severe pain (pain score 7-10) or moderate pain (pain score 4-6).)   No facility-administered encounter medications on file as of 11/11/2023.    Allergies (verified) Patient has no known allergies.   History: Past Medical History:  Diagnosis Date   Cirrhosis  (HCC) 10/09/2014   Hepatitis    Hep C- treated with Harvoni    History of esophageal varices    2023   Past Surgical History:  Procedure Laterality Date   COLONOSCOPY     ESOPHAGEAL BANDING  11/29/2021   Procedure: ESOPHAGEAL BANDING;  Surgeon: Daina Drum, MD;  Location: [facility] ENDOSCOPY;  Service: Gastroenterology;;   ESOPHAGOGASTRODUODENOSCOPY (EGD) WITH PROPOFOL  N/A 11/29/2021   Procedure: ESOPHAGOGASTRODUODENOSCOPY (EGD) WITH PROPOFOL ;  Surgeon: Daina Drum, MD;  Location: [facility] ENDOSCOPY;  Service: Gastroenterology;  Laterality: N/A;   FRACTURE SURGERY Right    Ankle screw placed   INGUINAL HERNIA REPAIR Right 09/01/2023   Procedure: LAPAROSCOPIC RIGHT INGUINAL HERNIA REPAIR WITH MESH;  Surgeon: Shela Derby, MD;  Location: [facility] OR;  Service: General;  Laterality: Right;   INSERTION OF MESH Right 09/01/2023   Procedure: INSERTION OF MESH;  Surgeon: Shela Derby, MD;  Location: [facility] OR;  Service: General;  Laterality: Right;   Family History  Problem Relation Age of Onset   Cancer Mother    Brain cancer Mother    Colon cancer Neg Hx    Colon polyps Neg Hx    Esophageal cancer Neg Hx    Rectal cancer Neg Hx    Stomach cancer Neg Hx    Social History   Socioeconomic History   Marital status: Single    Spouse name: Not on file   Number of children: Not on file   Years of education: Not on file   Highest education level: Not on file  Occupational History   Not on file  Tobacco Use   Smoking status: Never   Smokeless tobacco: Never  Vaping Use   Vaping status: Never Used  Substance and Sexual Activity   Alcohol use: Yes    Alcohol/week: 4.0 standard drinks of alcohol    Types: 4 Standard drinks or equivalent per week    Comment: Socially on weekends- beer and wine   Drug use: No   Sexual activity: Yes  Other Topics Concern   Not on file  Social History Narrative   Not on file   Social Drivers of Health   Financial Resource Strain: Low Risk  (11/11/2023)    Overall Financial Resource Strain (CARDIA)    Difficulty of Paying Living Expenses: Not hard at all  Food Insecurity: No Food Insecurity (11/11/2023)   Hunger Vital Sign    Worried About Running Out of Food in the Last Year: Never true    Ran Out of Food in the Last Year: Never true  Transportation Needs: No Transportation Needs (11/11/2023)   PRAPARE - Administrator, Civil Service (Medical): No    Lack of Transportation (Non-Medical): No  Physical Activity: Sufficiently Active (11/11/2023)   Exercise Vital Sign    Days of Exercise per Week: 5 days    Minutes of Exercise per Session: 30 min  Stress: No Stress  Concern Present (11/11/2023)   [employer] of Occupational Health - Occupational Stress Questionnaire    Feeling of Stress : Not at all  Social Connections: Moderately Isolated (11/11/2023)   Social Connection and Isolation Panel [NHANES]    Frequency of Communication with Friends and Family: More than three times a week    Frequency of Social Gatherings with Friends and Family: Three times a week    Attends Religious Services: 1 to 4 times per year    Active Member of Clubs or Organizations: No    Attends Banker Meetings: Never    Marital Status: Never married    Tobacco Counseling Counseling given: Not Answered    Clinical Intake:  Pre-visit preparation completed: Yes  Pain : No/denies pain Pain Score: 0-No pain     BMI - recorded: 28.41 Nutritional Status: BMI 25 -29 Overweight Nutritional Risks: None Diabetes: No  Lab Results  Component Value Date   HGBA1C 5.7 (H) 02/20/2023   HGBA1C 5.7 (H) 02/19/2021   HGBA1C 5.5 05/04/2019     How often do you need to have someone help you when you read instructions, pamphlets, or other written materials from your doctor or pharmacy?: 1 - Never  Interpreter Needed?: No  Information entered by :: Jennett Tarbell N. Rhea Kaelin, LPN.   Activities of Daily Living     11/11/2023    4:30 PM 08/21/2023    11:10 AM  In your present state of health, do you have any difficulty performing the following activities:  Hearing? 0   Vision? 0   Difficulty concentrating or making decisions? 0   Walking or climbing stairs? 0   Dressing or bathing? 0   Doing errands, shopping? 0 1  Comment  Pt does not drive  Preparing Food and eating ? N   Using the Toilet? N   In the past six months, have you accidently leaked urine? N   Do you have problems with loss of bowel control? N   Managing your Medications? N   Managing your Finances? N   Housekeeping or managing your Housekeeping? N     Patient Care Team: Lawrance Presume, MD as PCP - General (Internal Medicine) [facility], [facility]  I have updated your Care Teams any recent Medical Services you may have received from other providers in the past year.     Assessment:    This is a routine wellness examination for Wiatt.  Hearing/Vision screen Hearing Screening - Comments:: Denies hearing difficulties.  Vision Screening - Comments:: Wears rx glasses - not up to date with routine eye exams.    Goals Addressed             This Visit's Progress    11/11/2023: To remain active and independent.         Depression Screen     11/11/2023    4:29 PM 04/21/2023   11:37 AM 02/20/2023    2:16 PM 10/15/2022    1:42 PM 01/03/2022    9:38 AM 09/25/2021    3:55 PM 05/28/2021    2:40 PM  PHQ 2/9 Scores  PHQ - 2 Score 0 0 0 0 0 0 0  PHQ- 9 Score 1 0 0  0  0    Fall Risk     11/11/2023    4:28 PM 04/21/2023   11:37 AM 02/20/2023    2:16 PM 10/15/2022    1:41 PM 01/03/2022    9:37 AM  Fall Risk  Falls in the past year? 0 0 0 0 0  Number falls in past yr: 0 0 0 0 0  Injury with Fall? 0 0 0 0 0  Risk for fall due to : No Fall Risks No Fall Risks No Fall Risks No Fall Risks No Fall Risks  Follow up Falls evaluation completed Falls evaluation completed Falls evaluation completed Falls prevention discussed;Education provided;Falls evaluation  completed Falls evaluation completed    MEDICARE RISK AT HOME:  Medicare Risk at Home Any stairs in or around the home?: Yes If so, are there any without handrails?: No Home free of loose throw rugs in walkways, pet beds, electrical cords, etc?: Yes Adequate lighting in your home to reduce risk of falls?: Yes Life alert?: No Use of a cane, walker or w/c?: No Grab bars in the bathroom?: Yes Shower chair or bench in shower?: No Elevated toilet seat or a handicapped toilet?: Yes  TIMED UP AND GO:  Was the test performed?  No  Cognitive Function: Declined/Normal: No cognitive concerns noted by patient or family. Patient alert, oriented, able to answer questions appropriately and recall recent events. No signs of memory loss or confusion.    11/11/2023    4:29 PM 09/25/2021    3:56 PM 01/04/2020    3:03 PM  MMSE - Mini Mental State Exam  Not completed: Unable to complete    Orientation to time  5 5  Orientation to Place  5 5  Registration  3 3  Attention/ Calculation  5 0  Recall  3 3  Language- name 2 objects  2 2  Language- repeat  1 1  Language- follow 3 step command  3 3  Language- read & follow direction  1 1  Write a sentence  1 1  Copy design  1 1  Total score  30 25        11/11/2023    4:31 PM 10/15/2022    1:44 PM  6CIT Screen  What Year? 0 points 0 points  What month? 0 points 0 points  What time? 0 points 0 points  Count back from 20 0 points 0 points  Months in reverse 0 points 0 points  Repeat phrase 0 points 0 points  Total Score 0 points 0 points    Immunizations Immunization History  Administered Date(s) Administered   Fluad Trivalent(High Dose 65+) 02/20/2023   Hepatitis B, ADULT 01/23/2016, 02/26/2016   Influenza,inj,Quad PF,6+ Mos 02/25/2017, 04/20/2018, 05/04/2019, 02/19/2021   Pneumococcal Conjugate-13 05/04/2019   Pneumococcal Polysaccharide-23 02/19/2021   Tdap 12/06/2015, 12/20/2018    Screening Tests Health Maintenance  Topic Date Due    Zoster Vaccines- Shingrix (1 of 2) 02/14/2024 (Originally 12/13/2003)   COVID-19 Vaccine (1 - 2024-25 season) 03/07/2024 (Originally 02/09/2023)   INFLUENZA VACCINE  01/09/2024   Medicare Annual Wellness (AWV)  11/10/2024   Colonoscopy  05/09/2027   DTaP/Tdap/Td (3 - Td or Tdap) 12/19/2028   Pneumonia Vaccine 22+ Years old  Completed   Hepatitis C Screening  Completed   HPV VACCINES  Aged Out   Meningococcal B Vaccine  Aged Out    Health Maintenance  There are no preventive care reminders to display for this patient.  Health Maintenance Items Addressed: Yes   Additional Screening:  Vision Screening: Recommended annual ophthalmology exams for early detection of glaucoma and other disorders of the eye. Would you like a referral to an eye doctor? No    Dental Screening: Recommended annual dental exams for proper  oral hygiene  Community Resource Referral / Chronic Care Management: CRR required this visit?  No   CCM required this visit?  No   Plan:    I have personally reviewed and noted the following in the patient's chart:   Medical and social history Use of alcohol, tobacco or illicit drugs  Current medications and supplements including opioid prescriptions. Patient is not currently taking opioid prescriptions. Functional ability and status Nutritional status Physical activity Advanced directives List of other physicians Hospitalizations, surgeries, and ER visits in previous 12 months Vitals Screenings to include cognitive, depression, and falls Referrals and appointments  In addition, I have reviewed and discussed with patient certain preventive protocols, quality metrics, and best practice recommendations. A written personalized care plan for preventive services as well as general preventive health recommendations were provided to patient.   Margette Sheldon, LPN   11/16/6293   After Visit Summary: (MyChart) Due to this being a telephonic visit, the after visit  summary with patients personalized plan was offered to patient via MyChart   Notes: Nothing significant to report at this time.

## 2023-11-11 NOTE — Patient Instructions (Signed)
 Luis Morrison , Thank you for taking time out of your busy schedule to complete your Annual Wellness Visit with me. I enjoyed our conversation and look forward to speaking with you again next year. I, as well as your care team,  appreciate your ongoing commitment to your health goals. Please review the following plan we discussed and let me know if I can assist you in the future. Your Game plan/ To Do List    Referrals: If you haven't heard from the office you've been referred to, please reach out to them at the phone provided.   Follow up Visits: Next Medicare AWV with our clinical staff: 11/16/2024 at 4:10 pm Phone Visit with Nurse Health Advisor   Have you seen your provider in the last 6 months (3 months if uncontrolled diabetes)? Yes Next Office Visit with your provider: 01/06/2024 at 10:30 am Office Visit with Dr. Lincoln Renshaw  Clinician Recommendations:  Aim for 30 minutes of exercise or brisk walking, 6-8 glasses of water, and 5 servings of fruits and vegetables each day.       This is a list of the screening recommended for you and due dates:  Health Maintenance  Topic Date Due   Zoster (Shingles) Vaccine (1 of 2) 02/14/2024*   COVID-19 Vaccine (1 - 2024-25 season) 03/07/2024*   Flu Shot  01/09/2024   Medicare Annual Wellness Visit  11/10/2024   Colon Cancer Screening  05/09/2027   DTaP/Tdap/Td vaccine (3 - Td or Tdap) 12/19/2028   Pneumonia Vaccine  Completed   Hepatitis C Screening  Completed   HPV Vaccine  Aged Out   Meningitis B Vaccine  Aged Out  *Topic was postponed. The date shown is not the original due date.    Advanced directives: (Declined) Advance directive discussed with you today. Even though you declined this today, please call our office should you change your mind, and we can give you the proper paperwork for you to fill out. Advance Care Planning is important because it:  [x]  Makes sure you receive the medical care that is consistent with your values, goals, and  preferences  [x]  It provides guidance to your family and loved ones and reduces their decisional burden about whether or not they are making the right decisions based on your wishes.  Follow the link provided in your after visit summary or read over the paperwork we have mailed to you to help you started getting your Advance Directives in place. If you need assistance in completing these, please reach out to us  so that we can help you!  See attachments for Preventive Care and Fall Prevention Tips.

## 2024-01-05 ENCOUNTER — Telehealth: Payer: Self-pay | Admitting: Internal Medicine

## 2024-01-05 NOTE — Telephone Encounter (Signed)
Confirmed appt for 7/29

## 2024-01-06 ENCOUNTER — Ambulatory Visit: Attending: Internal Medicine | Admitting: Internal Medicine

## 2024-01-06 ENCOUNTER — Encounter: Payer: Self-pay | Admitting: Internal Medicine

## 2024-01-06 VITALS — BP 139/81 | HR 82 | Temp 98.1°F | Ht 70.0 in | Wt 193.0 lb

## 2024-01-06 DIAGNOSIS — R7303 Prediabetes: Secondary | ICD-10-CM

## 2024-01-06 DIAGNOSIS — I8510 Secondary esophageal varices without bleeding: Secondary | ICD-10-CM

## 2024-01-06 DIAGNOSIS — K7460 Unspecified cirrhosis of liver: Secondary | ICD-10-CM

## 2024-01-06 DIAGNOSIS — I10 Essential (primary) hypertension: Secondary | ICD-10-CM

## 2024-01-06 DIAGNOSIS — K7031 Alcoholic cirrhosis of liver with ascites: Secondary | ICD-10-CM

## 2024-01-06 DIAGNOSIS — Z23 Encounter for immunization: Secondary | ICD-10-CM | POA: Diagnosis not present

## 2024-01-06 DIAGNOSIS — R1011 Right upper quadrant pain: Secondary | ICD-10-CM | POA: Diagnosis not present

## 2024-01-06 DIAGNOSIS — Z87898 Personal history of other specified conditions: Secondary | ICD-10-CM

## 2024-01-06 LAB — POCT GLYCOSYLATED HEMOGLOBIN (HGB A1C): HbA1c, POC (prediabetic range): 5.3 % — AB (ref 5.7–6.4)

## 2024-01-06 LAB — GLUCOSE, POCT (MANUAL RESULT ENTRY): POC Glucose: 98 mg/dL (ref 70–99)

## 2024-01-06 MED ORDER — SPIRONOLACTONE 25 MG PO TABS: 25.0000 mg | ORAL_TABLET | Freq: Every day | ORAL | 3 refills | Status: AC

## 2024-01-06 MED ORDER — NADOLOL 20 MG PO TABS: 10.0000 mg | ORAL_TABLET | Freq: Every day | ORAL | 3 refills | Status: DC

## 2024-01-06 NOTE — Patient Instructions (Signed)
 VISIT SUMMARY:  Today, you came in for a follow-up appointment to manage your abdominal pain and refill your medications. We discussed your ongoing right-sided abdominal pain, your history of cirrhosis and esophageal varices, and your alcohol consumption. We also reviewed your blood pressure management.  YOUR PLAN:  -CIRRHOSIS WITH ASCITES: Cirrhosis is a condition where the liver is scarred and doesn't function properly, and ascites is the accumulation of fluid in the abdomen. We will order an abdominal ultrasound to check your liver and fluid levels, prescribe spironolactone  to prevent fluid buildup, and refer you to a liver specialist. Reducing your alcohol intake is strongly encouraged.  -CHRONIC HEPATITIS C INFECTION: Hepatitis C is a liver infection caused by the hepatitis C virus. Although you have been treated, ongoing monitoring is necessary to prevent complications. We will refer you to a liver specialist for further management.  -ALCOHOL USE DISORDER: This condition involves the inability to stop or control alcohol use despite negative consequences. You consume 3-4 drinks on weekends and have found it difficult to stop. Reducing your alcohol intake is strongly encouraged.  -ESOPHAGEAL VARICES WITHOUT BLEEDING: Esophageal varices are enlarged veins in the esophagus that can bleed. Currently, there is no bleeding. We will prescribe nadolol  to help manage this condition.  -HYPERTENSION: Hypertension is high blood pressure. Your current reading is slightly above the target. We will prescribe nadolol  to manage your blood pressure and encourage you to reduce your salt intake.  INSTRUCTIONS:  Please schedule an abdominal ultrasound as soon as possible. Follow up with the liver specialist as referred. Continue taking your prescribed medications, including spironolactone  and nadolol . Monitor your blood pressure at home and try to reduce your alcohol and salt intake. If you experience any new or  worsening symptoms, contact our office immediately.

## 2024-01-06 NOTE — Progress Notes (Signed)
 Patient ID: Luis Morrison, male    DOB: 1956-05-28  MRN: [id number]  CC: Follow-up (ER f/u. Layvonne pain medication for liver pain /)   Subjective: Luis Morrison is a 68 y.o. male who presents for chronic ds management. His concerns today include:  Hx HTN, Hep C status post successful treatment with Harvoni , cirrhosis, GERD, Vit D def, ETOH abuse, esophageal varices/bleeding 11/2021, preDM.   Discussed the use of AI scribe software for clinical note transcription with the patient, who gave verbal consent to proceed.  History of Present Illness Luis Morrison is a 68 year old male with cirrhosis and esophageal varices who presents for follow-up and management of abdominal pain and medication refill.  He has been experiencing ongoing right-sided abdominal pain for two to three months, which worsens at night, increasing from a 3 to a 6 or 7 on a pain scale. He has been using over-the-counter Tylenol  excessively for pain relief. No nausea, vomiting, or jaundice, but there is an increase in abdominal girth and weight gain.  He endorses early satiety and that his abdomen feels tight.  No changes in bowel movement or blood in the stools.  Pain is not worse with food.  CAT scan of the abdomen done in March of this year that showed changes of cirrhosis with splenomegaly and a small amount of ascites.  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  He has a history of cirrhosis and esophageal varices, previously treated for hepatitis C.  He has history of alcohol use disorder.  He consumes alcohol on weekends, with three to four drinks on Saturdays and Sundays, and finds it difficult to abstain.  He has been out of his blood pressure medication, nadolol , and has not been monitoring his blood pressure at home. No chest pain, shortness of breath, or leg swelling.  He has history of prediabetes.  A1c  today is normal. Results for orders placed or performed in visit on 01/06/24  POCT glycosylated hemoglobin (Hb A1C)   Collection Time: 01/06/24 11:21 AM  Result Value Ref Range   Hemoglobin A1C     HbA1c POC (<> result, manual entry)     HbA1c, POC (prediabetic range) 5.3 (A) 5.7 - 6.4 %   HbA1c, POC (controlled diabetic range)    POCT glucose (manual entry)   Collection Time: 01/06/24 11:21 AM  Result Value Ref Range   POC Glucose 98 70 - 99 mg/dl       Patient Active Problem List   Diagnosis Date Noted   Secondary esophageal varices with bleeding (HCC)    Portal hypertensive gastropathy (HCC)    UGIB (upper gastrointestinal bleed) 11/28/2021   Multiple fractures of ribs, right side, init for clos fx 12/20/2018   Thrombocytopenia (HCC) 12/20/2018   Pneumothorax on right 12/20/2018   Compensated HCV cirrhosis (HCC) 12/20/2018   Cortical age-related cataract of both eyes 10/09/2017   Secondary esophageal varices without bleeding (HCC) 05/30/2017   Prediabetes 02/26/2017   Alcohol abuse 08/06/2016   Vitamin D  deficiency 12/06/2015   Cirrhosis of liver without ascites (HCC) 10/09/2014   Hepatitis C virus infection cured after antiviral drug therapy 10/09/2014     Current Outpatient Medications on File Prior to Visit  Medication Sig Dispense Refill   acetaminophen  (TYLENOL ) 500 MG tablet Take 1,000 mg by mouth every 6 (six) hours as needed for mild pain (pain score 1-3).  dorzolamide-timolol (COSOPT) 2-0.5 % ophthalmic solution Place 1 drop into both eyes 2 (two) times daily.     latanoprost (XALATAN) 0.005 % ophthalmic solution Place 1 drop into both eyes at bedtime.     Naphazoline HCl (CLEAR EYES OP) Place 1 drop into both eyes daily as needed (redness).     sildenafil  (VIAGRA ) 50 MG tablet TAKE 1 TABLET BY MOUTH AS NEEDED HALF  HOUR  PRIOR  TO  INTERCOURSE.  LIMIT  USE  TO  1  TAB  EVERY  24  HOURS (Patient not taking: Reported on 01/06/2024) 10 tablet 1   No current  facility-administered medications on file prior to visit.    No Known Allergies  Social History   Socioeconomic History   Marital status: Single    Spouse name: Not on file   Number of children: Not on file   Years of education: Not on file   Highest education level: Not on file  Occupational History   Not on file  Tobacco Use   Smoking status: Never   Smokeless tobacco: Never  Vaping Use   Vaping status: Never Used  Substance and Sexual Activity   Alcohol use: Yes    Alcohol/week: 4.0 standard drinks of alcohol    Types: 4 Standard drinks or equivalent per week    Comment: Socially on weekends- beer and wine   Drug use: No   Sexual activity: Yes  Other Topics Concern   Not on file  Social History Narrative   Not on file   Social Drivers of Health   Financial Resource Strain: Low Risk  (11/11/2023)   Overall Financial Resource Strain (CARDIA)    Difficulty of Paying Living Expenses: Not hard at all  Food Insecurity: No Food Insecurity (11/11/2023)   Hunger Vital Sign    Worried About Running Out of Food in the Last Year: Never true    Ran Out of Food in the Last Year: Never true  Transportation Needs: No Transportation Needs (11/11/2023)   PRAPARE - Administrator, Civil Service (Medical): No    Lack of Transportation (Non-Medical): No  Physical Activity: Sufficiently Active (11/11/2023)   Exercise Vital Sign    Days of Exercise per Week: 5 days    Minutes of Exercise per Session: 30 min  Stress: No Stress Concern Present (11/11/2023)   [employer] of Occupational Health - Occupational Stress Questionnaire    Feeling of Stress : Not at all  Social Connections: Moderately Isolated (11/11/2023)   Social Connection and Isolation Panel    Frequency of Communication with Friends and Family: More than three times a week    Frequency of Social Gatherings with Friends and Family: Three times a week    Attends Religious Services: 1 to 4 times per year    Active  Member of Clubs or Organizations: No    Attends Banker Meetings: Never    Marital Status: Never married  Intimate Partner Violence: Not At Risk (11/11/2023)   Humiliation, Afraid, Rape, and Kick questionnaire    Fear of Current or Ex-Partner: No    Emotionally Abused: No    Physically Abused: No    Sexually Abused: No    Family History  Problem Relation Age of Onset   Cancer Mother    Brain cancer Mother    Colon cancer Neg Hx    Colon polyps Neg Hx    Esophageal cancer Neg Hx    Rectal cancer Neg Hx  Stomach cancer Neg Hx     Past Surgical History:  Procedure Laterality Date   COLONOSCOPY     ESOPHAGEAL BANDING  11/29/2021   Procedure: ESOPHAGEAL BANDING;  Surgeon: Federico Rosario BROCKS, MD;  Location: [facility] ENDOSCOPY;  Service: Gastroenterology;;   ESOPHAGOGASTRODUODENOSCOPY (EGD) WITH PROPOFOL  N/A 11/29/2021   Procedure: ESOPHAGOGASTRODUODENOSCOPY (EGD) WITH PROPOFOL ;  Surgeon: Federico Rosario BROCKS, MD;  Location: [facility] ENDOSCOPY;  Service: Gastroenterology;  Laterality: N/A;   FRACTURE SURGERY Right    Ankle screw placed   INGUINAL HERNIA REPAIR Right 09/01/2023   Procedure: LAPAROSCOPIC RIGHT INGUINAL HERNIA REPAIR WITH MESH;  Surgeon: Rubin Calamity, MD;  Location[facility] OR;  Service: General;  Laterality: Right;   INSERTION OF MESH Right 09/01/2023   Procedure: INSERTION OF MESH;  Surgeon: Rubin Calamity, MD;  Location: [facility] OR;  Service: General;  Laterality: Right;    ROS: Review of Systems Negative except as stated above  PHYSICAL EXAM: BP 139/81   Pulse 82   Temp 98.1 F (36.7 C) (Oral)   Ht 5' 10 (1.778 m)   Wt 193 lb (87.5 kg)   SpO2 98%   BMI 27.69 kg/m   Wt Readings from Last 3 Encounters:  01/06/24 193 lb (87.5 kg)  11/11/23 198 lb (89.8 kg)  09/01/23 198 lb (89.8 kg)    Physical Exam   General appearance - alert, well appearing, and in no distress Mental status - normal mood, behavior, speech, dress, motor activity, and thought processes Eyes  -nonicteric sclera Mouth -oral mucosa is moist Chest - clear to auscultation, no wheezes, rales or rhonchi, symmetric air entry Heart - normal rate, regular rhythm, normal S1, S2, no murmurs, rubs, clicks or gallops Abdomen -abdomen is mild to moderately distended.  Bowel sounds decreased.  Soft and nontender.  Liver edge is not felt.  He has questionable fluid wave. Extremities -trace bilateral lower extremity edema     Latest Ref Rng & Units 09/04/2023    7:41 PM 08/21/2023   11:12 AM 04/22/2023    1:44 PM  CMP  Glucose 70 - 99 mg/dL 863  99  896   BUN 8 - 23 mg/dL 7  7  5    Creatinine 0.61 - 1.24 mg/dL 9.10  9.14  9.20   Sodium 135 - 145 mmol/L 135  138  136   Potassium 3.5 - 5.1 mmol/L 3.4  4.0  3.9   Chloride 98 - 111 mmol/L 104  108  106   CO2 22 - 32 mmol/L 23  21  21    Calcium 8.9 - 10.3 mg/dL 8.6  8.5  8.5   Total Protein 6.5 - 8.1 g/dL 7.8  7.4  7.7   Total Bilirubin 0.0 - 1.2 mg/dL 1.1  1.3  0.9   Alkaline Phos 38 - 126 U/L 142  148  165   AST 15 - 41 U/L 48  54  49   ALT 0 - 44 U/L 28  34  29    Lipid Panel     Component Value Date/Time   CHOL 174 02/19/2021 1404   TRIG 88 02/19/2021 1404   HDL 71 02/19/2021 1404   CHOLHDL 2.5 02/19/2021 1404   LDLCALC 87 02/19/2021 1404    CBC    Component Value Date/Time   WBC 5.4 09/04/2023 1941   RBC 4.99 09/04/2023 1941   HGB 14.0 09/04/2023 1941   HGB 13.7 02/20/2023 1503   HCT 43.5 09/04/2023 1941   HCT 43.4 02/20/2023 1503  PLT 59 (L) 09/04/2023 1941   PLT 54 (LL) 02/20/2023 1503   MCV 87.2 09/04/2023 1941   MCV 87 02/20/2023 1503   MCH 28.1 09/04/2023 1941   MCHC 32.2 09/04/2023 1941   RDW 15.7 (H) 09/04/2023 1941   RDW 15.1 02/20/2023 1503   LYMPHSABS 1.0 01/20/2019 1529   MONOABS 357 07/30/2016 1126   EOSABS 0.0 01/20/2019 1529   BASOSABS 0.0 01/20/2019 1529    ASSESSMENT AND PLAN: 1. Essential hypertension (Primary) Not at goal.  He also has history of esophageal varices.  Will restart nadolol  at 10  mg daily. - nadolol  (CORGARD ) 20 MG tablet; Take 0.5 tablets (10 mg total) by mouth daily.  Dispense: 90 tablet; Refill: 3  2.  Chronic right upper quadrant abdominal pain  alcoholic cirrhosis of liver with ascites (HCC) Advised patient that we should get an ultrasound to look at the liver and gallbladder even though he has had a CAT scan that was done back in March.  Symptoms have started since then.  I suspect that he has increased ascites contributing to his symptoms.  Advised that we can get the ultrasound as soon as possible and if there is increased fluid, I can refer him to interventional radiology to have the paracentesis.  Patient did not seem to motivated to pursue getting the ultrasound today with follow-up paracentesis if needed. -He is agreeable to going some other day to get the ultrasound done.  I also recommend referral to gastroenterology or liver specialist.  Again he was reluctant but eventually agreed. -In the meantime I will put him on low-dose of spironolactone  and get some blood test today. -Strongly advised that he discontinue drinking alcoholic beverages or cut back more to no more than 1-2 standard drinks on the weekends.  Patiently emphatically said that he will not quit drinking - Comprehensive metabolic panel with GFR - CBC - PT AND PTT - spironolactone  (ALDACTONE ) 25 MG tablet; Take 1 tablet (25 mg total) by mouth daily.  Dispense: 90 tablet; Refill: 3 - US  Abdomen Limited RUQ (LIVER/GB); Future - Ambulatory referral to Gastroenterology  3. Esophageal varices in cirrhosis (HCC) - nadolol  (CORGARD ) 20 MG tablet; Take 0.5 tablets (10 mg total) by mouth daily.  Dispense: 90 tablet; Refill: 3 - Ambulatory referral to Gastroenterology  4. Hx Prediabetes No longer in the prediabetes range - POCT glycosylated hemoglobin (Hb A1C) - POCT glucose (manual entry)  5. Need for hepatitis B vaccination Third and final vaccine of hepatitis B series was given  today.   Patient was given the opportunity to ask questions.  Patient verbalized understanding of the plan and was able to repeat key elements of the plan.   This documentation was completed using Paediatric nurse.  Any transcriptional errors are unintentional.  Orders Placed This Encounter  Procedures   US  Abdomen Limited RUQ (LIVER/GB)   Heplisav-B  (HepB-CPG) Vaccine   Comprehensive metabolic panel with GFR   CBC   PT AND PTT   Ambulatory referral to Gastroenterology   POCT glycosylated hemoglobin (Hb A1C)   POCT glucose (manual entry)     Requested Prescriptions   Signed Prescriptions Disp Refills   nadolol  (CORGARD ) 20 MG tablet 90 tablet 3    Sig: Take 0.5 tablets (10 mg total) by mouth daily.   spironolactone  (ALDACTONE ) 25 MG tablet 90 tablet 3    Sig: Take 1 tablet (25 mg total) by mouth daily.    Return in about 6 weeks (around 02/17/2024).  Barnie  Vicci, MD, GENI

## 2024-01-07 ENCOUNTER — Ambulatory Visit: Payer: Self-pay | Admitting: Internal Medicine

## 2024-01-07 ENCOUNTER — Ambulatory Visit: Payer: Self-pay

## 2024-01-07 DIAGNOSIS — R748 Abnormal levels of other serum enzymes: Secondary | ICD-10-CM

## 2024-01-07 DIAGNOSIS — K7031 Alcoholic cirrhosis of liver with ascites: Secondary | ICD-10-CM

## 2024-01-07 LAB — COMPREHENSIVE METABOLIC PANEL WITH GFR
ALT: 15 IU/L (ref 0–44)
AST: 36 IU/L (ref 0–40)
Albumin: 3.2 g/dL — ABNORMAL LOW (ref 3.9–4.9)
Alkaline Phosphatase: 230 IU/L — ABNORMAL HIGH (ref 44–121)
BUN/Creatinine Ratio: 8 — ABNORMAL LOW (ref 10–24)
BUN: 6 mg/dL — ABNORMAL LOW (ref 8–27)
Bilirubin Total: 1.8 mg/dL — ABNORMAL HIGH (ref 0.0–1.2)
CO2: 19 mmol/L — ABNORMAL LOW (ref 20–29)
Calcium: 8.4 mg/dL — ABNORMAL LOW (ref 8.6–10.2)
Chloride: 104 mmol/L (ref 96–106)
Creatinine, Ser: 0.77 mg/dL (ref 0.76–1.27)
Globulin, Total: 4.7 g/dL — ABNORMAL HIGH (ref 1.5–4.5)
Glucose: 88 mg/dL (ref 70–99)
Potassium: 3.6 mmol/L (ref 3.5–5.2)
Sodium: 137 mmol/L (ref 134–144)
Total Protein: 7.9 g/dL (ref 6.0–8.5)
eGFR: 96 mL/min/1.73 (ref >59)

## 2024-01-07 LAB — CBC
Hematocrit: 42 % (ref 37.5–51.0)
Hemoglobin: 13.5 g/dL (ref 13.0–17.7)
MCH: 29 pg (ref 26.6–33.0)
MCHC: 32.1 g/dL (ref 31.5–35.7)
MCV: 90 fL (ref 79–97)
Platelets: 78 x10E3/uL — CL (ref 150–450)
RBC: 4.65 x10E6/uL (ref 4.14–5.80)
RDW: 13.8 % (ref 11.6–15.4)
WBC: 4.1 x10E3/uL (ref 3.4–10.8)

## 2024-01-07 LAB — PT AND PTT
INR: 1.2 (ref 0.9–1.2)
Prothrombin Time: 12.8 s — ABNORMAL HIGH (ref 9.1–12.0)
aPTT: 30 s (ref 24–33)

## 2024-01-07 MED ORDER — TRAMADOL HCL 50 MG PO TABS: 50.0000 mg | ORAL_TABLET | Freq: Every evening | ORAL | 0 refills | Status: DC | PRN

## 2024-01-07 NOTE — Telephone Encounter (Signed)
 Please call [facility] and request that abdominal US  be done ASAP. Let pt know that I have sent rxn to his pharmacy for a limited supply of Tramadol  to take at nights. Should not be taken with alcoholic beverages. It is a narcotic medication that can cause drowsiness. Clarisa: Looks like the system is not allowing me to send the prescription electronically. Keeps printing. I will have to sign it tomorrow and have you fax it to the pharmacy.

## 2024-01-07 NOTE — Telephone Encounter (Signed)
 Spoke with patient . Patient has scheduled his appointment for abdominal US   for First of August. Advised patient that Dr. Vicci has prescribed Tramadol  50 mg And he may have to come by to pick it up on tomorrow if she unable to send it electronically. Patient was very appreciative for the call.

## 2024-01-07 NOTE — Addendum Note (Signed)
 Addended by: DELORES RUBY F on: 01/07/2024 04:33 PM   Modules accepted: Orders

## 2024-01-07 NOTE — Telephone Encounter (Signed)
 Patient requesting rx pain meds, advised message would be routed to PCP office.  FYI Only or Action Required?: Action required by provider: clinical question for provider.  Patient was last seen in primary care on 01/06/2024 by Vicci Barnie NOVAK, MD.  Called Nurse Triage reporting Chronic Abdominal Pain.  Symptoms began several months ago.  Interventions attempted: OTC medications: tylenol .  Symptoms are: gradually worsening.  Triage Disposition: No disposition on file.  Patient/caregiver understands and will follow disposition?:  Reason for Disposition  [1] MILD-MODERATE pain AND [2] constant AND [3] age > 60 years  Answer Assessment - Initial Assessment Questions Pt reports no improvement. Patient states taking tylenol , this RN asked if he has been advised not to take tylenol  d/t hx of liver disease and he states he was told to stop awhile ago but states was not mentioned yesterday. This RN asked patient to not take the tylenol  until contacted by PCP's office. Reports he has abdominal imaging scheduled for August. ED precautions reviewed, pt verbalized understanding.   1. LOCATION: Where does it hurt?      Abdomen  3. ONSET: When did the pain begin? (Minutes, hours or days ago)      3+ months  6. SEVERITY: How bad is the pain?  (e.g., Scale 1-10; mild, moderate, or severe)     7/10  7. RECURRENT SYMPTOM: Have you ever had this type of stomach pain before? If Yes, ask: When was the last time? and What happened that time?      Yes, chronic. Seen in office for same yesterday 01/06/24  8. CAUSE: What do you think is causing the stomach pain? (e.g., gallstones, recent abdominal surgery)     See OV notes  Protocols used: Abdominal Pain - Male-A-AH Copied from CRM #[id number]. Topic: Clinical - Medication Question >> Jan 07, 2024  9:07 AM Myrick T wrote: Reason for CRM: patient called stated he was at a 7 with his pain level. Patient stated he needs some pain  medication for the pain he is having with his liver. Please f/u with patient

## 2024-01-07 NOTE — Addendum Note (Signed)
 Addended by: VICCI SOBER B on: 01/07/2024 01:11 PM   Modules accepted: Orders

## 2024-01-08 ENCOUNTER — Other Ambulatory Visit: Payer: Self-pay | Admitting: Internal Medicine

## 2024-01-08 MED ORDER — TRAMADOL HCL 50 MG PO TABS: 50.0000 mg | ORAL_TABLET | Freq: Every evening | ORAL | 0 refills | Status: DC | PRN

## 2024-01-08 NOTE — Telephone Encounter (Signed)
 Prescription was sent electronically. No further assistance needed at this time.

## 2024-01-12 ENCOUNTER — Telehealth: Payer: Self-pay | Admitting: Internal Medicine

## 2024-01-12 NOTE — Telephone Encounter (Signed)
 Error. Please disregard previous message.   Called & spoke to the patient. Verified name & DOB. Patient was originally requesting for a referral to be sent to [facility] for a colonoscopy due to transportation issues and [facility] is within walking distance. Advised patient that [facility] does not offer that service but that his insurance offers free transportation to medical appointments. Patient expressed verbal understanding. Advised patient to confirm with insurance and schedule an appointment with [facility] if transportation is available. Patient  will call back if there are further questions or concerns.

## 2024-01-12 NOTE — Telephone Encounter (Signed)
 Copied from CRM [id number]. Topic: Referral - Question >> Jan 12, 2024 10:24 AM Antwanette L wrote:  Reason for CRM: Pt wants to know if Dr. Vicci can send his referral for Diagnosis K70.31 (ICD-10-CM) - Alcoholic cirrhosis of liver with ascites (HCC) and K74.60,I85.10 (ICD-10-CM) - Esophageal varices in cirrhosis (HCC) to [place].The does not drive and cannot go to [place], Please contact the pt at [phone]

## 2024-02-18 ENCOUNTER — Telehealth: Payer: Self-pay | Admitting: Internal Medicine

## 2024-02-18 NOTE — Telephone Encounter (Signed)
 Confirmed appt for 9/11

## 2024-02-19 ENCOUNTER — Ambulatory Visit: Admitting: Internal Medicine

## 2024-03-16 ENCOUNTER — Ambulatory Visit: Admitting: Internal Medicine

## 2024-04-15 ENCOUNTER — Ambulatory Visit: Admitting: Internal Medicine

## 2024-04-26 ENCOUNTER — Other Ambulatory Visit: Payer: Self-pay | Admitting: Internal Medicine

## 2024-04-26 NOTE — Telephone Encounter (Signed)
 Copied from CRM [id number]. Topic: Clinical - Medication Refill >> Apr 26, 2024  4:42 PM Everette C wrote: Medication: Naphazoline HCl (CLEAR EYES OP) [[id number]]  dorzolamide-timolol (COSOPT) 2-0.5 % ophthalmic solution [[id number]]  Has the patient contacted their pharmacy? Yes (Agent: If no, request that the patient contact the pharmacy for the refill. If patient does not wish to contact the pharmacy document the reason why and proceed with request.) (Agent: If yes, when and what did the pharmacy advise?)  This is the patient's preferred pharmacy:  [id number] - [place] ([place]), [place] - [address] [place] ([place]) [place] [address] Phone: [phone] Fax: [phone]  Is this the correct pharmacy for this prescription? Yes If no, delete pharmacy and type the correct one.   Has the prescription been filled recently? Yes  Is the patient out of the medication? No  Has the patient been seen for an appointment in the last year OR does the patient have an upcoming appointment? Yes  Can we respond through MyChart? No  Agent: Please be advised that Rx refills may take up to 3 business days. We ask that you follow-up with your pharmacy.

## 2024-04-27 NOTE — Telephone Encounter (Signed)
 Unable to pend: Naphazoline HCl (CLEAR EYES OP) [[id number]]  Historical med

## 2024-04-29 NOTE — Telephone Encounter (Signed)
 Requested medications are due for refill today.  unsure  Requested medications are on the active medications list.  1 is - 1 is not  Last refill. 05/19/2023 for Cosopt  Future visit scheduled.   yes  Notes to clinic.  Pt is requesting a refill of above historical medication and also, Naphazoline HCl (CLEAR EYES OP) [[id number]]  Historical med    Requested Prescriptions  Pending Prescriptions Disp Refills   dorzolamide-timolol (COSOPT) 2-0.5 % ophthalmic solution 10 mL     Sig: Place 1 drop into both eyes 2 (two) times daily.     Ophthalmology:  Glaucoma - dorzolamide / timolol Passed - 04/29/2024  1:11 PM      Passed - Cr in normal range and within 360 days    Creat  Date Value Ref Range Status  07/22/2016 1.00 0.70 - 1.25 mg/dL Final    Comment:      For patients > or = 70 years of age: The upper reference limit for Creatinine is approximately 13% higher for people identified as African-American.      Creatinine, Ser  Date Value Ref Range Status  01/06/2024 0.77 0.76 - 1.27 mg/dL Final         Passed - eGFR is 30 or above and within 360 days    GFR calc Af Amer  Date Value Ref Range Status  09/02/2019 94 >59 mL/min/1.73 Final   GFR, Estimated  Date Value Ref Range Status  09/04/2023 >60 >60 mL/min Final    Comment:    (NOTE) Calculated using the CKD-EPI Creatinine Equation (2021)    eGFR  Date Value Ref Range Status  01/06/2024 96 >59 mL/min/1.73 Final         Passed - Last BP in normal range    BP Readings from Last 1 Encounters:  01/06/24 139/81         Passed - Last Heart Rate in normal range    Pulse Readings from Last 1 Encounters:  01/06/24 82         Passed - Valid encounter within last 12 months    Recent Outpatient Visits           3 months ago Essential hypertension   [facility] Of [place]. [facility] Barnie NOVAK, MD   1 year ago Non-recurrent unilateral inguinal hernia without  obstruction or gangrene   [facility] - [facility] Dept Of [place]. [facility] Barnie NOVAK, MD   1 year ago Essential hypertension   [facility] Of [place]. [facility] Barnie NOVAK, MD   2 years ago Hospital discharge follow-up   [facility] - [facility] Of [place]. [facility] Barnie NOVAK, MD   2 years ago Acute cough   [facility] - [facility] Dept Of . [facility] Vic[facility] Barnie NOVAK, MD

## 2024-05-13 ENCOUNTER — Encounter (HOSPITAL_COMMUNITY): Payer: Self-pay | Admitting: General Surgery

## 2024-05-20 ENCOUNTER — Encounter: Payer: Self-pay | Admitting: *Deleted

## 2024-05-20 ENCOUNTER — Ambulatory Visit: Attending: *Deleted | Admitting: *Deleted

## 2024-05-20 VITALS — BP 116/74 | HR 85 | Resp 19 | Ht 70.0 in | Wt 190.2 lb

## 2024-05-20 DIAGNOSIS — Z23 Encounter for immunization: Secondary | ICD-10-CM

## 2024-05-20 DIAGNOSIS — K7031 Alcoholic cirrhosis of liver with ascites: Secondary | ICD-10-CM

## 2024-05-20 DIAGNOSIS — D649 Anemia, unspecified: Secondary | ICD-10-CM

## 2024-05-20 DIAGNOSIS — N529 Male erectile dysfunction, unspecified: Secondary | ICD-10-CM

## 2024-05-20 MED ORDER — TRAMADOL HCL 50 MG PO TABS: 50.0000 mg | ORAL_TABLET | Freq: Every evening | ORAL | 0 refills | Status: AC | PRN

## 2024-05-20 NOTE — Patient Instructions (Signed)
 You were seen today for a flu shot and a refill of medication for your erectile dysfunction We also discussed the refill for your tramadol  that will be sent to your pharmacy We did discuss your increasing abdominal girth with history of cirrhosis of the liver. We would like to proceed with repeat lab work including PT PTT, CBC, CMP and a referral back to the GI specialist

## 2024-05-20 NOTE — Progress Notes (Unsigned)
 Patient ID: Luis Morrison, male    DOB: 1955-12-26  MRN: [id number]  CC: Medical Management of Chronic Issues and Medication Refill   Subjective: Luis Morrison is a 68 y.o. male who presents for chronic ds management. His concerns today include:  History of cirrhosis of the liver:  He was seen most recently in July by his PCP. At that time she ordered an ultrasound of the liver. Unfortunately he did not have this test completed. He has been seen in the past by [facility].   Stomach has been more swollen.  He denies nausea, vomiting, diarrhea or constipation.  No blood in vomitus or stool. He continues to consume alcohol but was not forthcoming with quantity that he consumes daily His bowel and bladder function are normal. He admits that he quit taking spironolactone  as he thought that might be causing his abdominal distention. He has restarted it but noticed no change in his abdominal swelling.   He is requesting a refill of his tramadol  for pain at night.  Erectile dysfunction: He request a refill of his sildenafil  to have on hand. Does not use it often but it is effective when he uses it with no side effects or concerns.  Health maintenance: He is agreeable to a flu shot today     Patient Active Problem List   Diagnosis Date Noted   Secondary esophageal varices with bleeding (HCC)    Portal hypertensive gastropathy (HCC)    UGIB (upper gastrointestinal bleed) 11/28/2021   Multiple fractures of ribs, right side, init for clos fx 12/20/2018   Thrombocytopenia 12/20/2018   Pneumothorax on right 12/20/2018   Compensated HCV cirrhosis (HCC) 12/20/2018   Cortical age-related cataract of both eyes 10/09/2017   Secondary esophageal varices without bleeding (HCC) 05/30/2017   Prediabetes 02/26/2017   Alcohol abuse 08/06/2016   Vitamin D  deficiency 12/06/2015   Cirrhosis of liver without ascites (HCC) 10/09/2014   Hepatitis C virus infection cured after antiviral drug therapy  10/09/2014     Medications Ordered Prior to Encounter[1]  Allergies[2]  Social History   Socioeconomic History   Marital status: Single    Spouse name: Not on file   Number of children: Not on file   Years of education: Not on file   Highest education level: Not on file  Occupational History   Not on file  Tobacco Use   Smoking status: Never   Smokeless tobacco: Never  Vaping Use   Vaping status: Never Used  Substance and Sexual Activity   Alcohol use: Yes    Alcohol/week: 4.0 standard drinks of alcohol    Types: 4 Standard drinks or equivalent per week    Comment: Socially on weekends- beer and wine   Drug use: No   Sexual activity: Yes  Other Topics Concern   Not on file  Social History Narrative   Not on file   Social Drivers of Health   Tobacco Use: Low Risk (05/20/2024)   Patient History    Smoking Tobacco Use: Never    Smokeless Tobacco Use: Never    Passive Exposure: Not on file  Financial Resource Strain: Low Risk (11/11/2023)   Overall Financial Resource Strain (CARDIA)    Difficulty of Paying Living Expenses: Not hard at all  Food Insecurity: No Food Insecurity (11/11/2023)   Hunger Vital Sign    Worried About Running Out of Food in the Last Year: Never true    Ran Out of Food in the Last Year:  Never true  Transportation Needs: No Transportation Needs (11/11/2023)   PRAPARE - Administrator, Civil Service (Medical): No    Lack of Transportation (Non-Medical): No  Physical Activity: Sufficiently Active (11/11/2023)   Exercise Vital Sign    Days of Exercise per Week: 5 days    Minutes of Exercise per Session: 30 min  Stress: No Stress Concern Present (11/11/2023)   [employer] of Occupational Health - Occupational Stress Questionnaire    Feeling of Stress : Not at all  Social Connections: Moderately Isolated (11/11/2023)   Social Connection and Isolation Panel    Frequency of Communication with Friends and Family: More than three times a week     Frequency of Social Gatherings with Friends and Family: Three times a week    Attends Religious Services: 1 to 4 times per year    Active Member of Clubs or Organizations: No    Attends Banker Meetings: Never    Marital Status: Never married  Intimate Partner Violence: Not At Risk (11/11/2023)   Humiliation, Afraid, Rape, and Kick questionnaire    Fear of Current or Ex-Partner: No    Emotionally Abused: No    Physically Abused: No    Sexually Abused: No  Depression (PHQ2-9): Low Risk (01/06/2024)   Depression (PHQ2-9)    PHQ-2 Score: 0  Alcohol Screen: Low Risk (11/11/2023)   Alcohol Screen    Last Alcohol Screening Score (AUDIT): 1  Housing: Low Risk (11/11/2023)   Housing Stability Vital Sign    Unable to Pay for Housing in the Last Year: No    Number of Times Moved in the Last Year: 0    Homeless in the Last Year: No  Utilities: Not At Risk (11/11/2023)   AHC Utilities    Threatened with loss of utilities: No  Health Literacy: Adequate Health Literacy (11/11/2023)   B1300 Health Literacy    Frequency of need for help with medical instructions: Rarely    Family History  Problem Relation Age of Onset   Cancer Mother    Brain cancer Mother    Colon cancer Neg Hx    Colon polyps Neg Hx    Esophageal cancer Neg Hx    Rectal cancer Neg Hx    Stomach cancer Neg Hx     Past Surgical History:  Procedure Laterality Date   COLONOSCOPY     ESOPHAGEAL BANDING  11/29/2021   Procedure: ESOPHAGEAL BANDING;  Surgeon: Federico Rosario BROCKS, MD;  Location: [facility] ENDOSCOPY;  Service: Gastroenterology;;   ESOPHAGOGASTRODUODENOSCOPY (EGD) WITH PROPOFOL  N/A 11/29/2021   Procedure: ESOPHAGOGASTRODUODENOSCOPY (EGD) WITH PROPOFOL ;  Surgeon: Federico Rosario BROCKS, MD;  Location: [facility] ENDOSCOPY;  Service: Gastroenterology;  Laterality: N/A;   FRACTURE SURGERY Right    Ankle screw placed   INGUINAL HERNIA REPAIR Right 09/01/2023   Procedure: LAPAROSCOPIC RIGHT INGUINAL HERNIA REPAIR WITH MESH;  Surgeon:  Rubin Calamity, MD;  Location: [facility] OR;  Service: General;  Laterality: Right;    ROS: Review of Systems Negative except as stated above  PHYSICAL EXAM: BP 116/74 (BP Location: Left Arm, Patient Position: Sitting, Cuff Size: Normal)   Pulse 85   Resp 19   Ht 5' 10 (1.778 m)   Wt 190 lb 3.2 oz (86.3 kg)   SpO2 99%   BMI 27.29 kg/m   Alert and oriented.  Affect appropriate.  Eye contact good. Lungs are clear.   Heart regular rate and rhythm. Abdomen: Distended and tympanic.  Bowel sounds present.  Difficult to percuss organ borders due to ascites.  Diffuse tenderness throughout Moves extremities x 4 Trace lower extremity edema   Physical Exam     Latest Ref Rng & Units 05/20/2024   10:34 AM 01/06/2024   12:22 PM 09/04/2023    7:41 PM  CMP  Glucose 70 - 99 mg/dL 81  88  863   BUN 8 - 27 mg/dL 5  6  7    Creatinine 0.76 - 1.27 mg/dL 9.35  9.22  9.10   Sodium 134 - 144 mmol/L 133  137  135   Potassium 3.5 - 5.2 mmol/L 4.4  3.6  3.4   Chloride 96 - 106 mmol/L 100  104  104   CO2 20 - 29 mmol/L 22  19  23    Calcium 8.6 - 10.2 mg/dL 8.2  8.4  8.6   Total Protein 6.0 - 8.5 g/dL 8.7  7.9  7.8   Total Bilirubin 0.0 - 1.2 mg/dL 1.7  1.8  1.1   Alkaline Phos 47 - 123 IU/L 213  230  142   AST 0 - 40 IU/L 30  36  48   ALT 0 - 44 IU/L 14  15  28     Lipid Panel     Component Value Date/Time   CHOL 174 02/19/2021 1404   TRIG 88 02/19/2021 1404   HDL 71 02/19/2021 1404   CHOLHDL 2.5 02/19/2021 1404   LDLCALC 87 02/19/2021 1404    CBC    Component Value Date/Time   WBC 4.6 05/20/2024 1034   WBC 5.4 09/04/2023 1941   RBC 4.04 (L) 05/20/2024 1034   RBC 4.99 09/04/2023 1941   HGB 11.9 (L) 05/20/2024 1034   HCT 36.6 (L) 05/20/2024 1034   PLT 118 (L) 05/20/2024 1034   MCV 91 05/20/2024 1034   MCH 29.5 05/20/2024 1034   MCH 28.1 09/04/2023 1941   MCHC 32.5 05/20/2024 1034   MCHC 32.2 09/04/2023 1941   RDW 13.0 05/20/2024 1034   LYMPHSABS 0.4 (L) 05/20/2024 1034   MONOABS  357 07/30/2016 1126   EOSABS 0.1 05/20/2024 1034   BASOSABS 0.0 05/20/2024 1034    ASSESSMENT AND PLAN:    Assessment & Plan Alcoholic cirrhosis of liver with ascites (HCC) Cirrhosis of the liver, exacerbated. Still needs ultrasound of the abdomen that was ordered in July. He was encouraged to continue  spironolactone  and Nadol. Limited number of tramadol  given after PMP review  PT PTT, CBC, CMP and refer back to [facility] Orders:   PT AND PTT   CBC with Differential   Comprehensive metabolic panel with GFR   Ambulatory referral to Gastroenterology  Erectile dysfunction, unspecified erectile dysfunction type Refill sildenafil  to have on hand Orders:   sildenafil  (VIAGRA ) 50 MG tablet; TAKE 1 TABLET BY MOUTH AS NEEDED HALF  HOUR  PRIOR  TO  INTERCOURSE.  LIMIT  USE  TO  1  TAB  EVERY  24  HOURS  Need for influenza vaccination Patient is agreeable to flu shot this season. Okay to give Orders:   Flu vaccine HIGH DOSE PF(Fluzone Trivalent)  Normocytic anemia  Orders:   Iron, TIBC and Ferritin Panel   Add 12/12/ 25: Hemoglobin 13.5, hematocrit 42.0. This is decreased since last evaluation. Iron panel was ordered.  Plan accordingly    Patient was given the opportunity to ask questions.  Patient verbalized understanding of the plan and was able to repeat key elements of the plan.   This documentation  was completed using Paediatric nurse.  Any transcriptional errors are unintentional.  Orders Placed This Encounter  Procedures   Flu vaccine HIGH DOSE PF(Fluzone Trivalent)   PT AND PTT   CBC with Differential   Comprehensive metabolic panel with GFR   Iron, TIBC and Ferritin Panel   Ambulatory referral to Gastroenterology     Requested Prescriptions   Signed Prescriptions Disp Refills   sildenafil  (VIAGRA ) 50 MG tablet 10 tablet 1    Sig: TAKE 1 TABLET BY MOUTH AS NEEDED HALF  HOUR  PRIOR  TO  INTERCOURSE.  LIMIT  USE  TO  1  TAB  EVERY  24   HOURS   traMADol  (ULTRAM ) 50 MG tablet 15 tablet 0    Sig: Take 1 tablet (50 mg total) by mouth at bedtime as needed.    No follow-ups on file.  Cheyenne Schumm H, NP      [1]  Current Outpatient Medications on File Prior to Visit  Medication Sig Dispense Refill   acetaminophen  (TYLENOL ) 500 MG tablet Take 1,000 mg by mouth every 6 (six) hours as needed for mild pain (pain score 1-3).     dorzolamide-timolol (COSOPT) 2-0.5 % ophthalmic solution Place 1 drop into both eyes 2 (two) times daily.     latanoprost (XALATAN) 0.005 % ophthalmic solution Place 1 drop into both eyes at bedtime.     nadolol  (CORGARD ) 20 MG tablet Take 0.5 tablets (10 mg total) by mouth daily. 90 tablet 3   spironolactone  (ALDACTONE ) 25 MG tablet Take 1 tablet (25 mg total) by mouth daily. 90 tablet 3   Naphazoline HCl (CLEAR EYES OP) Place 1 drop into both eyes daily as needed (redness). (Patient not taking: Reported on 05/20/2024)     No current facility-administered medications on file prior to visit.  [2] No Known Allergies

## 2024-05-21 ENCOUNTER — Ambulatory Visit: Admitting: Internal Medicine

## 2024-05-21 ENCOUNTER — Ambulatory Visit: Payer: Self-pay | Admitting: *Deleted

## 2024-05-21 LAB — CBC WITH DIFFERENTIAL/PLATELET
Basophils Absolute: 0 x10E3/uL (ref 0.0–0.2)
Basos: 0 %
EOS (ABSOLUTE): 0.1 x10E3/uL (ref 0.0–0.4)
Eos: 2 %
Hematocrit: 36.6 % — ABNORMAL LOW (ref 37.5–51.0)
Hemoglobin: 11.9 g/dL — ABNORMAL LOW (ref 13.0–17.7)
Immature Grans (Abs): 0 x10E3/uL (ref 0.0–0.1)
Immature Granulocytes: 0 %
Lymphocytes Absolute: 0.4 x10E3/uL — ABNORMAL LOW (ref 0.7–3.1)
Lymphs: 9 %
MCH: 29.5 pg (ref 26.6–33.0)
MCHC: 32.5 g/dL (ref 31.5–35.7)
MCV: 91 fL (ref 79–97)
Monocytes Absolute: 0.4 x10E3/uL (ref 0.1–0.9)
Monocytes: 9 %
Neutrophils Absolute: 3.7 x10E3/uL (ref 1.4–7.0)
Neutrophils: 80 %
Platelets: 118 x10E3/uL — ABNORMAL LOW (ref 150–450)
RBC: 4.04 x10E6/uL — ABNORMAL LOW (ref 4.14–5.80)
RDW: 13 % (ref 11.6–15.4)
WBC: 4.6 x10E3/uL (ref 3.4–10.8)

## 2024-05-21 LAB — COMPREHENSIVE METABOLIC PANEL WITH GFR
ALT: 14 IU/L (ref 0–44)
AST: 30 IU/L (ref 0–40)
Albumin: 2.8 g/dL — ABNORMAL LOW (ref 3.9–4.9)
Alkaline Phosphatase: 213 IU/L — ABNORMAL HIGH (ref 47–123)
BUN/Creatinine Ratio: 8 — ABNORMAL LOW (ref 10–24)
BUN: 5 mg/dL — ABNORMAL LOW (ref 8–27)
Bilirubin Total: 1.7 mg/dL — ABNORMAL HIGH (ref 0.0–1.2)
CO2: 22 mmol/L (ref 20–29)
Calcium: 8.2 mg/dL — ABNORMAL LOW (ref 8.6–10.2)
Chloride: 100 mmol/L (ref 96–106)
Creatinine, Ser: 0.64 mg/dL — ABNORMAL LOW (ref 0.76–1.27)
Globulin, Total: 5.9 g/dL — ABNORMAL HIGH (ref 1.5–4.5)
Glucose: 81 mg/dL (ref 70–99)
Potassium: 4.4 mmol/L (ref 3.5–5.2)
Sodium: 133 mmol/L — ABNORMAL LOW (ref 134–144)
Total Protein: 8.7 g/dL — ABNORMAL HIGH (ref 6.0–8.5)
eGFR: 102 mL/min/1.73 (ref >59)

## 2024-05-21 LAB — PT AND PTT
INR: 1.2 (ref 0.9–1.2)
Prothrombin Time: 13.2 s — ABNORMAL HIGH (ref 9.1–12.0)
aPTT: 32 s (ref 24–33)

## 2024-05-24 ENCOUNTER — Other Ambulatory Visit: Payer: Self-pay | Admitting: *Deleted

## 2024-05-24 DIAGNOSIS — D509 Iron deficiency anemia, unspecified: Secondary | ICD-10-CM

## 2024-05-24 LAB — IRON,TIBC AND FERRITIN PANEL
Ferritin: 542 ng/mL — ABNORMAL HIGH (ref 30–400)
Iron Saturation: 34 % (ref 15–55)
Iron: 71 ug/dL (ref 38–169)
Total Iron Binding Capacity: 211 ug/dL — ABNORMAL LOW (ref 250–450)
UIBC: 140 ug/dL (ref 111–343)

## 2024-05-24 LAB — SPECIMEN STATUS REPORT

## 2024-05-24 MED ORDER — IRON (FERROUS SULFATE) 325 (65 FE) MG PO TABS: 325.0000 mg | ORAL_TABLET | Freq: Every day | ORAL | 3 refills | Status: AC

## 2024-05-27 ENCOUNTER — Telehealth: Payer: Self-pay | Admitting: *Deleted

## 2024-05-27 ENCOUNTER — Other Ambulatory Visit: Payer: Self-pay | Admitting: Internal Medicine

## 2024-05-27 NOTE — Telephone Encounter (Signed)
 duplicate

## 2024-05-27 NOTE — Telephone Encounter (Signed)
 I called him to inform him that Dr. Vicci wanted him to hold his iron  until she was able to discuss his anemia with his at his next follow-up. He did not pick up the medication yet

## 2024-06-07 ENCOUNTER — Other Ambulatory Visit

## 2024-06-25 ENCOUNTER — Telehealth: Payer: Self-pay | Admitting: Internal Medicine

## 2024-06-25 NOTE — Telephone Encounter (Unsigned)
 Copied from CRM [id number]. Topic: Clinical - Medication Refill >> Jun 25, 2024 12:00 PM Ivette P wrote: Medication: Naphazoline HCl (CLEAR EYES OP)  Has the patient contacted their pharmacy? Yes (Agent: If no, request that the patient contact the pharmacy for the refill. If patient does not wish to contact the pharmacy document the reason why and proceed with request.) (Agent: If yes, when and what did the pharmacy advise?)  This is the patient's preferred pharmacy:  [facility] - [place] - [address] [place] ([place]) [place] [address] Phone: [phone] Fax: [phone]  Is this the correct pharmacy for this prescription? Yes If no, delete pharmacy and type the correct one.   Has the prescription been filled recently? No  Is the patient out of the medication? Yes  Has the patient been seen for an appointment in the last year OR does the patient have an upcoming appointment? Yes  Can we respond through MyChart? No  Agent: Please be advised that Rx refills may take up to 3 business days. We ask that you follow-up with your pharmacy.

## 2024-06-25 NOTE — Telephone Encounter (Signed)
 Copied from CRM [id number]. Topic: Clinical - Medication Refill >> Jun 25, 2024 12:00 PM Ivette P wrote: Medication: Naphazoline HCl (CLEAR EYES OP)  Has the patient contacted their pharmacy? Yes (Agent: If no, request that the patient contact the pharmacy for the refill. If patient does not wish to contact the pharmacy document the reason why and proceed with request.) (Agent: If yes, when and what did the pharmacy advise?)  This is the patient's preferred pharmacy:  [facility] - [place] - [address] [place] ([place])  [address] Phone: [phone] Fax: [phone]  Is this the correct pharmacy for this prescription? Yes If no, delete pharmacy and type the correct one.   Has the prescription been filled recently? No  Is the patient out of the medication? Yes  Has the patient been seen for an appointment in the last year OR does the patient have an upcoming appointment? Yes  Can we respond through MyChart? No  Agent: Please be advised that Rx refills may take up to 3 business days. We ask that you follow-up with your pharmacy. This encounter was created in error - please disregard.

## 2024-06-28 MED ORDER — NAPHAZOLINE-PHENIRAMINE 0.025-0.3 % OP SOLN: 1.0000 [drp] | Freq: Every day | OPHTHALMIC | 0 refills | Status: AC | PRN

## 2024-06-28 NOTE — Telephone Encounter (Signed)
 Medication is on patient's list but never prescribed by you.

## 2024-06-28 NOTE — Telephone Encounter (Signed)
 RF sent

## 2024-06-29 NOTE — Telephone Encounter (Signed)
 Called but no answer. LVM informing that the requested prescription was sent to his preferred pharmacy.

## 2024-07-02 ENCOUNTER — Ambulatory Visit: Admitting: Gastroenterology

## 2024-07-12 ENCOUNTER — Other Ambulatory Visit: Payer: Self-pay | Admitting: Internal Medicine

## 2024-07-12 DIAGNOSIS — I10 Essential (primary) hypertension: Secondary | ICD-10-CM

## 2024-07-12 DIAGNOSIS — I8510 Secondary esophageal varices without bleeding: Secondary | ICD-10-CM

## 2024-07-12 NOTE — Telephone Encounter (Signed)
 Copied from CRM [id number]. Topic: Clinical - Medication Refill >> Jul 12, 2024 12:34 PM Wess RAMAN wrote: Medication: dorzolamide-timolol (COSOPT) 2-0.5 % ophthalmic solution  latanoprost (XALATAN) 0.005 % ophthalmic solution  nadolol  (CORGARD ) 20 MG tablet  Has the patient contacted their pharmacy? No (Agent: If no, request that the patient contact the pharmacy for the refill. If patient does not wish to contact the pharmacy document the reason why and proceed with request.) (Agent: If yes, when and what did the pharmacy advise?)  This is the patient's preferred pharmacy:  [facility] - [place] - [address] [place] ([place]) [place] [address] Phone: [phone] Fax: [phone]  Is this the correct pharmacy for this prescription? Yes If no, delete pharmacy and type the correct one.   Has the prescription been filled recently? Yes  Is the patient out of the medication? No  Has the patient been seen for an appointment in the last year OR does the patient have an upcoming appointment? Yes  Can we respond through MyChart? Yes  Agent: Please be advised that Rx refills may take up to 3 business days. We ask that you follow-up with your pharmacy.

## 2024-07-14 MED ORDER — NADOLOL 20 MG PO TABS: 10.0000 mg | ORAL_TABLET | Freq: Every day | ORAL | 3 refills | Status: AC

## 2024-11-16 ENCOUNTER — Ambulatory Visit
